# Patient Record
Sex: Female | Born: 1960 | Race: Black or African American | Hispanic: No | Marital: Married | Smoking: Former smoker
Health system: Southern US, Community
[De-identification: ages and names within clinical notes are randomized; demographics above are authoritative.]

## PROBLEM LIST (undated history)

## (undated) ENCOUNTER — Emergency Department (HOSPITAL_COMMUNITY): Admission: EM | Payer: 59 | Source: Home / Self Care

## (undated) DIAGNOSIS — Z91018 Allergy to other foods: Secondary | ICD-10-CM

## (undated) DIAGNOSIS — J45909 Unspecified asthma, uncomplicated: Secondary | ICD-10-CM

## (undated) DIAGNOSIS — K219 Gastro-esophageal reflux disease without esophagitis: Secondary | ICD-10-CM

## (undated) DIAGNOSIS — R0600 Dyspnea, unspecified: Secondary | ICD-10-CM

## (undated) DIAGNOSIS — M545 Low back pain, unspecified: Secondary | ICD-10-CM

## (undated) DIAGNOSIS — M1990 Unspecified osteoarthritis, unspecified site: Secondary | ICD-10-CM

## (undated) DIAGNOSIS — E559 Vitamin D deficiency, unspecified: Secondary | ICD-10-CM

## (undated) DIAGNOSIS — I10 Essential (primary) hypertension: Secondary | ICD-10-CM

## (undated) DIAGNOSIS — Z8489 Family history of other specified conditions: Secondary | ICD-10-CM

## (undated) DIAGNOSIS — E538 Deficiency of other specified B group vitamins: Secondary | ICD-10-CM

## (undated) DIAGNOSIS — E8881 Metabolic syndrome: Secondary | ICD-10-CM

## (undated) DIAGNOSIS — J349 Unspecified disorder of nose and nasal sinuses: Secondary | ICD-10-CM

## (undated) DIAGNOSIS — E785 Hyperlipidemia, unspecified: Secondary | ICD-10-CM

## (undated) DIAGNOSIS — C801 Malignant (primary) neoplasm, unspecified: Secondary | ICD-10-CM

## (undated) DIAGNOSIS — R1310 Dysphagia, unspecified: Secondary | ICD-10-CM

## (undated) DIAGNOSIS — R7303 Prediabetes: Secondary | ICD-10-CM

## (undated) DIAGNOSIS — D649 Anemia, unspecified: Secondary | ICD-10-CM

## (undated) DIAGNOSIS — M2550 Pain in unspecified joint: Secondary | ICD-10-CM

## (undated) DIAGNOSIS — G709 Myoneural disorder, unspecified: Secondary | ICD-10-CM

## (undated) DIAGNOSIS — M792 Neuralgia and neuritis, unspecified: Secondary | ICD-10-CM

## (undated) DIAGNOSIS — R0602 Shortness of breath: Secondary | ICD-10-CM

## (undated) DIAGNOSIS — T7840XA Allergy, unspecified, initial encounter: Secondary | ICD-10-CM

## (undated) HISTORY — DX: Allergy to other foods: Z91.018

## (undated) HISTORY — PX: COLONOSCOPY: SHX174

## (undated) HISTORY — DX: Low back pain: M54.5

## (undated) HISTORY — DX: Prediabetes: R73.03

## (undated) HISTORY — DX: Dyspnea, unspecified: R06.00

## (undated) HISTORY — PX: WISDOM TOOTH EXTRACTION: SHX21

## (undated) HISTORY — DX: Neuralgia and neuritis, unspecified: M79.2

## (undated) HISTORY — DX: Deficiency of other specified B group vitamins: E53.8

## (undated) HISTORY — DX: Shortness of breath: R06.02

## (undated) HISTORY — PX: FOOT SURGERY: SHX648

## (undated) HISTORY — DX: Low back pain, unspecified: M54.50

## (undated) HISTORY — DX: Gastro-esophageal reflux disease without esophagitis: K21.9

## (undated) HISTORY — DX: Unspecified disorder of nose and nasal sinuses: J34.9

## (undated) HISTORY — DX: Anemia, unspecified: D64.9

## (undated) HISTORY — DX: Metabolic syndrome: E88.810

## (undated) HISTORY — DX: Vitamin D deficiency, unspecified: E55.9

## (undated) HISTORY — DX: Metabolic syndrome: E88.81

## (undated) HISTORY — DX: Myoneural disorder, unspecified: G70.9

## (undated) HISTORY — DX: Dysphagia, unspecified: R13.10

## (undated) HISTORY — DX: Unspecified asthma, uncomplicated: J45.909

## (undated) HISTORY — DX: Pain in unspecified joint: M25.50

## (undated) HISTORY — DX: Unspecified osteoarthritis, unspecified site: M19.90

## (undated) HISTORY — PX: ESOPHAGOGASTRODUODENOSCOPY: SHX1529

## (undated) HISTORY — DX: Allergy, unspecified, initial encounter: T78.40XA

## (undated) HISTORY — DX: Hyperlipidemia, unspecified: E78.5

---

## 1997-12-05 ENCOUNTER — Ambulatory Visit (HOSPITAL_COMMUNITY): Admission: RE | Admit: 1997-12-05 | Discharge: 1997-12-05 | Payer: Self-pay | Admitting: Obstetrics & Gynecology

## 1998-03-24 ENCOUNTER — Ambulatory Visit (HOSPITAL_COMMUNITY): Admission: RE | Admit: 1998-03-24 | Discharge: 1998-03-24 | Payer: Self-pay | Admitting: *Deleted

## 1998-11-17 ENCOUNTER — Other Ambulatory Visit: Admission: RE | Admit: 1998-11-17 | Discharge: 1998-11-17 | Payer: Self-pay | Admitting: Obstetrics and Gynecology

## 1999-05-17 ENCOUNTER — Encounter: Admission: RE | Admit: 1999-05-17 | Discharge: 1999-05-17 | Payer: Self-pay | Admitting: *Deleted

## 1999-05-17 ENCOUNTER — Encounter: Payer: Self-pay | Admitting: *Deleted

## 1999-06-25 ENCOUNTER — Encounter: Admission: RE | Admit: 1999-06-25 | Discharge: 1999-09-23 | Payer: Self-pay | Admitting: Internal Medicine

## 1999-12-23 ENCOUNTER — Other Ambulatory Visit: Admission: RE | Admit: 1999-12-23 | Discharge: 1999-12-23 | Payer: Self-pay | Admitting: Obstetrics and Gynecology

## 2000-08-04 ENCOUNTER — Inpatient Hospital Stay: Admission: AD | Admit: 2000-08-04 | Discharge: 2000-08-04 | Payer: Self-pay | Admitting: Obstetrics and Gynecology

## 2000-09-19 ENCOUNTER — Encounter: Payer: Self-pay | Admitting: Obstetrics & Gynecology

## 2000-09-19 ENCOUNTER — Ambulatory Visit (HOSPITAL_COMMUNITY): Admission: RE | Admit: 2000-09-19 | Discharge: 2000-09-19 | Payer: Self-pay | Admitting: Obstetrics & Gynecology

## 2000-09-24 ENCOUNTER — Inpatient Hospital Stay (HOSPITAL_COMMUNITY): Admission: AD | Admit: 2000-09-24 | Discharge: 2000-09-27 | Payer: Self-pay | Admitting: Obstetrics and Gynecology

## 2000-10-01 ENCOUNTER — Inpatient Hospital Stay (HOSPITAL_COMMUNITY): Admission: AD | Admit: 2000-10-01 | Discharge: 2000-10-01 | Payer: Self-pay | Admitting: Obstetrics and Gynecology

## 2000-10-02 ENCOUNTER — Encounter: Payer: Self-pay | Admitting: Obstetrics & Gynecology

## 2000-10-02 ENCOUNTER — Inpatient Hospital Stay (HOSPITAL_COMMUNITY): Admission: AD | Admit: 2000-10-02 | Discharge: 2000-10-02 | Payer: Self-pay | Admitting: Obstetrics & Gynecology

## 2000-10-03 ENCOUNTER — Inpatient Hospital Stay (HOSPITAL_COMMUNITY): Admission: AD | Admit: 2000-10-03 | Discharge: 2000-10-03 | Payer: Self-pay | Admitting: Obstetrics & Gynecology

## 2000-10-03 ENCOUNTER — Encounter: Payer: Self-pay | Admitting: Obstetrics & Gynecology

## 2001-02-05 ENCOUNTER — Other Ambulatory Visit: Admission: RE | Admit: 2001-02-05 | Discharge: 2001-02-05 | Payer: Self-pay | Admitting: Obstetrics and Gynecology

## 2001-02-16 ENCOUNTER — Emergency Department (HOSPITAL_COMMUNITY): Admission: EM | Admit: 2001-02-16 | Discharge: 2001-02-16 | Payer: Self-pay | Admitting: Emergency Medicine

## 2001-02-16 ENCOUNTER — Encounter: Payer: Self-pay | Admitting: Emergency Medicine

## 2001-03-06 ENCOUNTER — Encounter: Admission: RE | Admit: 2001-03-06 | Discharge: 2001-03-21 | Payer: Self-pay | Admitting: Internal Medicine

## 2002-05-20 ENCOUNTER — Other Ambulatory Visit: Admission: RE | Admit: 2002-05-20 | Discharge: 2002-05-20 | Payer: Self-pay | Admitting: Obstetrics and Gynecology

## 2003-05-22 ENCOUNTER — Other Ambulatory Visit: Admission: RE | Admit: 2003-05-22 | Discharge: 2003-05-22 | Payer: Self-pay | Admitting: Obstetrics and Gynecology

## 2004-06-17 ENCOUNTER — Other Ambulatory Visit: Admission: RE | Admit: 2004-06-17 | Discharge: 2004-06-17 | Payer: Self-pay | Admitting: Obstetrics and Gynecology

## 2007-09-08 ENCOUNTER — Encounter: Payer: Self-pay | Admitting: Internal Medicine

## 2007-09-10 ENCOUNTER — Encounter: Payer: Self-pay | Admitting: Internal Medicine

## 2007-09-14 ENCOUNTER — Encounter: Payer: Self-pay | Admitting: Internal Medicine

## 2007-09-17 ENCOUNTER — Ambulatory Visit: Payer: Self-pay | Admitting: Internal Medicine

## 2007-09-17 DIAGNOSIS — R198 Other specified symptoms and signs involving the digestive system and abdomen: Secondary | ICD-10-CM

## 2007-09-17 DIAGNOSIS — M129 Arthropathy, unspecified: Secondary | ICD-10-CM | POA: Insufficient documentation

## 2007-09-17 DIAGNOSIS — R12 Heartburn: Secondary | ICD-10-CM

## 2007-09-17 DIAGNOSIS — E7800 Pure hypercholesterolemia, unspecified: Secondary | ICD-10-CM

## 2007-09-19 ENCOUNTER — Encounter: Payer: Self-pay | Admitting: Internal Medicine

## 2007-09-25 ENCOUNTER — Ambulatory Visit: Payer: Self-pay | Admitting: Internal Medicine

## 2007-11-27 ENCOUNTER — Ambulatory Visit: Payer: Self-pay | Admitting: Internal Medicine

## 2007-12-06 ENCOUNTER — Ambulatory Visit: Payer: Self-pay | Admitting: Internal Medicine

## 2007-12-17 ENCOUNTER — Ambulatory Visit: Payer: Self-pay | Admitting: Internal Medicine

## 2007-12-25 ENCOUNTER — Encounter: Payer: Self-pay | Admitting: Internal Medicine

## 2007-12-25 ENCOUNTER — Ambulatory Visit: Payer: Self-pay | Admitting: Internal Medicine

## 2008-03-06 ENCOUNTER — Encounter: Admission: RE | Admit: 2008-03-06 | Discharge: 2008-03-06 | Payer: Self-pay | Admitting: Allergy

## 2009-05-02 HISTORY — PX: MOUTH SURGERY: SHX715

## 2010-06-04 ENCOUNTER — Other Ambulatory Visit: Payer: Self-pay | Admitting: Family Medicine

## 2010-06-04 DIAGNOSIS — M25512 Pain in left shoulder: Secondary | ICD-10-CM

## 2010-06-07 ENCOUNTER — Ambulatory Visit
Admission: RE | Admit: 2010-06-07 | Discharge: 2010-06-07 | Disposition: A | Discharge status: Home / Self Care | Payer: 59 | Source: Ambulatory Visit | Attending: Family Medicine | Admitting: Family Medicine

## 2010-06-07 DIAGNOSIS — M25512 Pain in left shoulder: Secondary | ICD-10-CM

## 2010-08-27 ENCOUNTER — Other Ambulatory Visit: Payer: Self-pay | Admitting: Sports Medicine

## 2010-08-27 DIAGNOSIS — M542 Cervicalgia: Secondary | ICD-10-CM

## 2010-08-27 DIAGNOSIS — M25512 Pain in left shoulder: Secondary | ICD-10-CM

## 2010-08-28 ENCOUNTER — Ambulatory Visit
Admission: RE | Admit: 2010-08-28 | Discharge: 2010-08-28 | Disposition: A | Discharge status: Home / Self Care | Payer: 59 | Source: Ambulatory Visit | Attending: Sports Medicine | Admitting: Sports Medicine

## 2010-08-28 DIAGNOSIS — M542 Cervicalgia: Secondary | ICD-10-CM

## 2010-09-17 NOTE — Op Note (Signed)
[facility]  Patient:    Nicole Rasmussen, Nicole Rasmussen                   MRN: [id number] Proc. Date: 09/25/00 Adm. Date:  [id number] Attending:  Osborn Coho                           Operative Report  PREOPERATIVE DIAGNOSES:       1. Intrauterine pregnancy at 41-1/2 weeks                                  estimated gestational age.                               2. Post term pregnancy.                               3. Advanced maternal age.                               4. Failure to progress.                               5. Uterine fibroids.  POSTOPERATIVE DIAGNOSES:      1. Intrauterine pregnancy at 41-1/2 weeks                                  estimated gestational age.                               2. Post term pregnancy.                               3. Advanced maternal age.                               4. Failure to progress.                               5. Uterine fibroids.  PROCEDURE:                    Primary low transverse cesarean section.  SURGEON:                      Mark E. Dareen Piano, M.D.  ANESTHESIA:                   General endotracheal.  ANTIBIOTICS:                  Ancef 1 g.  ESTIMATED BLOOD LOSS:         900 cc.  SPECIMENS:                    None.  DRAINS:                       Foley  to bedside drainage.  COMPLICATIONS:                None.  FINDINGS:                     The patient had normal fallopian tubes and ovaries bilaterally.  The patient had several uterine fibroids, the largest was approximately 5 cm, which was in the posterior aspect of the uterus in the middle in the body of the uterus.  Several other smaller fibroids were scattered throughout the uterus.  The placenta appeared to be normal.  The patient delivered one live viable black female infant weighing 9 lb and 2 oz.  DESCRIPTION OF PROCEDURE:     The patient was taken to the operating room, where she was placed in the dorsal supine position  with a left lateral tilt. A Foley catheter had been previously placed.  was placed as prepped with Hibiclens and draped in the usual fashion for this procedure.  General endotracheal anesthetic was then administered.  A Pfannenstiel incision was then made.  This was carried down to the fascia.  The fascia was entered in the midline and extended laterally.  The rectus muscles were then dissected from the fascia with a Bovie.  The rectus muscles were divided in the midline and taken superiorly and inferiorly.  The peritoneum was then entered.  A bladder flap was taken down sharply.  A low transverse uterine incision was made in the midline and extended laterally.  Amniotic fluid was noted to be clear.  The infant was delivered from the vertex presentation.  On delivery of the head, the oropharynx and nostrils were bulb suctioned.  The remaining infant was then delivered.  The cord was doubly clamped and cut and the infant handed to the waiting NICU team.  Cord blood was then obtained.  The placenta was then manually removed.  The uterus was exteriorized.  The uterine cavity was wiped with a wet lap.  The uterine incision was closed with a single layer of 0 chromic in a running locking fashion.  A small area of bleeding in the middle of the incision was made hemostatic with interrupted 0 Monocryl suture. The bladder flap was not closed.  The uterus was placed back in the abdominal cavity.  The abdominal gutters were wiped with a wet lap.  The incision was again rechecked and felt to be hemostatic.  The parietal peritoneum was not closed.  The fascia was closed using 0 Monocryl in a running fashion.  The subcuticular tissue was made hemostatic with the Bovie.  Stainless steel clips were used to close the skin.  The patient tolerated the procedure well.  She was taken to the recovery room in stable condition.  Instrument and lap counts were correct x 2. DD:  09/25/00 TD:  09/25/00 Job:  [id number]

## 2010-09-17 NOTE — Discharge Summary (Signed)
[facility]  Patient:    Nicole Rasmussen, Nicole Rasmussen                   MRN: [id number] Adm. Date:  [id number] Disc. Date: [id number] Attending:  Lars Pinks                           Discharge Summary  FINAL DIAGNOSES:              1. Intrauterine pregnancy, delivered living well                                  female infant weighing 9 pounds 2 ounces.                               2. Postterm pregnancy.                               3. Advanced maternal age.                               4. Failure to progress.                               5. Uterine fibroids.  SECONDARY DIAGNOSIS:          None.  PROCEDURE:                    Primary low transverse cesarean section.  COMPLICATIONS:                None.  CONDITION ON DISCHARGE:       Improved.  HOSPITAL COURSE:              This is a 50 year old, gravida 4, para 0, who was brought to the hospital for induction of labor due to postdates pregnancy. The patient was begun on induction which progressed to 5 cm; however, at that point, the patient failed to progress any further and the decision was made to proceed with cesarean section for failure to progress. She was brought to the operating room by Dr. Malva Limes where a primary low transverse cesarean section was performed under general endotracheal anesthesia. The surgery went without complications with the delivery of a 9-pound 2-ounce female infant, Apgar scores 9 and 9. The patients postoperative course was benign without significant fever or anemia. On the second postoperative day, the patient was afebrile, her vital signs were stable, she was doing well, her pain was controlled with oral pain medications, she was voiding well, and passing gas. She was therefore felt to be ready for discharge. She was discharged on a regular diet. She told to limit her activity.  DISCHARGE MEDICATIONS:        Tylox, 30 tablets, take one to two every  four hours for pain. She was also instructed to take prenatal vitamins and over-the-counter pain medication as well.  DISCHARGE FOLLOWUP:           She was asked to return to the office in four weeks for her followup evaluation.  LABORATORY DATA:              Her  admission hemoglobin was 12.6 with a white count of 13,000. Her discharge hemoglobin was 11.0 with white count of 19,000. Her platelet count was low with clumps noted on the peripheral smear. 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 DD:  10/16/00 TD:  10/17/00 Job: [id number]

## 2010-11-01 ENCOUNTER — Emergency Department (INDEPENDENT_AMBULATORY_CARE_PROVIDER_SITE_OTHER): Payer: 59

## 2010-11-01 ENCOUNTER — Emergency Department (HOSPITAL_BASED_OUTPATIENT_CLINIC_OR_DEPARTMENT_OTHER)
Admission: EM | Admit: 2010-11-01 | Discharge: 2010-11-01 | Disposition: A | Discharge status: Home / Self Care | Payer: 59 | Attending: Emergency Medicine | Admitting: Emergency Medicine

## 2010-11-01 DIAGNOSIS — F17200 Nicotine dependence, unspecified, uncomplicated: Secondary | ICD-10-CM | POA: Insufficient documentation

## 2010-11-01 DIAGNOSIS — M19079 Primary osteoarthritis, unspecified ankle and foot: Secondary | ICD-10-CM

## 2010-11-01 DIAGNOSIS — R609 Edema, unspecified: Secondary | ICD-10-CM

## 2010-11-01 DIAGNOSIS — Y92009 Unspecified place in unspecified non-institutional (private) residence as the place of occurrence of the external cause: Secondary | ICD-10-CM | POA: Insufficient documentation

## 2010-11-01 DIAGNOSIS — G8929 Other chronic pain: Secondary | ICD-10-CM | POA: Insufficient documentation

## 2010-11-01 DIAGNOSIS — E7800 Pure hypercholesterolemia, unspecified: Secondary | ICD-10-CM | POA: Insufficient documentation

## 2010-11-01 DIAGNOSIS — M25539 Pain in unspecified wrist: Secondary | ICD-10-CM

## 2010-11-01 DIAGNOSIS — W108XXA Fall (on) (from) other stairs and steps, initial encounter: Secondary | ICD-10-CM | POA: Insufficient documentation

## 2010-11-01 DIAGNOSIS — M79609 Pain in unspecified limb: Secondary | ICD-10-CM

## 2010-11-01 DIAGNOSIS — S63509A Unspecified sprain of unspecified wrist, initial encounter: Secondary | ICD-10-CM | POA: Insufficient documentation

## 2010-11-01 DIAGNOSIS — M542 Cervicalgia: Secondary | ICD-10-CM | POA: Insufficient documentation

## 2011-01-17 ENCOUNTER — Other Ambulatory Visit: Payer: Self-pay | Admitting: Obstetrics and Gynecology

## 2013-05-19 ENCOUNTER — Ambulatory Visit (INDEPENDENT_AMBULATORY_CARE_PROVIDER_SITE_OTHER): Payer: 59 | Admitting: Emergency Medicine

## 2013-05-19 ENCOUNTER — Ambulatory Visit: Payer: 59

## 2013-05-19 VITALS — BP 110/68 | HR 115 | Temp 101.4°F | Resp 20 | Ht 67.5 in | Wt 272.6 lb

## 2013-05-19 DIAGNOSIS — R6889 Other general symptoms and signs: Secondary | ICD-10-CM

## 2013-05-19 DIAGNOSIS — R05 Cough: Secondary | ICD-10-CM

## 2013-05-19 DIAGNOSIS — R059 Cough, unspecified: Secondary | ICD-10-CM

## 2013-05-19 LAB — POCT CBC
GRANULOCYTE PERCENT: 74.6 % (ref 37–80)
HEMATOCRIT: 44.3 % (ref 37.7–47.9)
Hemoglobin: 13.5 g/dL (ref 12.2–16.2)
Lymph, poc: 2.4 (ref 0.6–3.4)
MCH: 27.3 pg (ref 27–31.2)
MCHC: 30.5 g/dL — AB (ref 31.8–35.4)
MCV: 89.7 fL (ref 80–97)
MID (CBC): 1.2 — AB (ref 0–0.9)
MPV: 10.4 fL (ref 0–99.8)
POC Granulocyte: 10.6 — AB (ref 2–6.9)
POC LYMPH %: 17 % (ref 10–50)
POC MID %: 8.4 %M (ref 0–12)
Platelet Count, POC: 166 10*3/uL (ref 142–424)
RBC: 4.94 M/uL (ref 4.04–5.48)
RDW, POC: 14.7 %
WBC: 14.2 10*3/uL — AB (ref 4.6–10.2)

## 2013-05-19 LAB — POCT INFLUENZA A/B
INFLUENZA A, POC: NEGATIVE
INFLUENZA B, POC: NEGATIVE

## 2013-05-19 MED ORDER — AZITHROMYCIN 250 MG PO TABS: ORAL_TABLET | ORAL | Status: DC

## 2013-05-19 MED ORDER — OSELTAMIVIR PHOSPHATE 75 MG PO CAPS: 75.0000 mg | ORAL_CAPSULE | Freq: Two times a day (BID) | ORAL | Status: DC

## 2013-05-19 NOTE — Progress Notes (Deleted)
Subjective:   This chart was scribed for Nena Jordan MD by Forrestine Him, [facility] Scribe. This patient was seen in room 4 and the patient's care was started 5:48 PM.     Patient ID: Nicole Rasmussen, female    DOB: November 05, 1960, 53 y.o.   MRN: [id number]  HPI HPI Comments: Nicole Rasmussen is a 53 y.o. female who presents to [facility] complaining of a gradual onset, ongoing, constant fever that initially started 2 days ago. She also lists generalized body aches, chills, and HA as associated symptoms. She has tried OTC ibuprofen with no improvement. She denies any aggravating or alleviating factors at this time. She denies any sick contacts, but states her husband recently had pneumonia several weeks ago. She denies cough or sore throat.  Review of Systems  Constitutional: Positive for fever and chills.  HENT: Negative for sore throat.   Respiratory: Negative for cough.   Musculoskeletal: Positive for myalgias.    Past Medical History  Diagnosis Date   Allergy    History reviewed. No pertinent past surgical history.  History   Social History   Marital Status: Married    Spouse Name: N/A    Number of Children: N/A   Years of Education: N/A   Occupational History   Not on file.   Social History Main Topics   Smoking status: Never Smoker    Smokeless tobacco: Not on file   Alcohol Use: Yes   Drug Use: No   Sexual Activity: Not on file   Other Topics Concern   Not on file   Social History Narrative   No narrative on file    Triage Vitals: BP 110/68   Pulse 115   Temp(Src) 101.4 F (38.6 C) (Oral)   Resp 20   Ht 5' 7.5" (1.715 m)   Wt 272 lb 9.6 oz (123.651 kg)   BMI 42.04 kg/m2   SpO2 97%   LMP 10/11/2010   Objective:   Physical Exam  CONSTITUTIONAL: Well developed/well nourished HEAD: Normocephalic/atraumatic EYES: EOMI/PERRL ENMT: Mucous membranes moist NECK: supple no meningeal signs SPINE:entire spine  nontender CV: S1/S2 noted, no murmurs/rubs/gallops noted LUNGS: Lungs are clear to auscultation bilaterally, no apparent distress ABDOMEN: soft, nontender, no rebound or guarding GU:no cva tenderness NEURO: Pt is awake/alert, moves all extremitiesx4 EXTREMITIES: pulses normal, full ROM SKIN: warm, color normal PSYCH: no abnormalities of mood noted   Results for orders placed in visit on 05/19/13  POCT INFLUENZA A/B      Result Value Range   Influenza A, POC Negative     Influenza B, POC Negative     Results for orders placed in visit on 05/19/13  POCT INFLUENZA A/B      Result Value Range   Influenza A, POC Negative     Influenza B, POC Negative    POCT CBC      Result Value Range   WBC 14.2 (*) 4.6 - 10.2 K/uL   Lymph, poc 2.4  0.6 - 3.4   POC LYMPH PERCENT 17.0  10 - 50 %L   MID (cbc) 1.2 (*) 0 - 0.9   POC MID % 8.4  0 - 12 %M   POC Granulocyte 10.6 (*) 2 - 6.9   Granulocyte percent 74.6  37 - 80 %G   RBC 4.94  4.04 - 5.48 M/uL   Hemoglobin 13.5  12.2 - 16.2 g/dL   HCT, POC 44.3  37.7 - 47.9 %  MCV 89.7  80 - 97 fL   MCH, POC 27.3  27 - 31.2 pg   MCHC 30.5 (*) 31.8 - 35.4 g/dL   RDW, POC 14.7     Platelet Count, POC 166  142 - 424 K/uL   MPV 10.4  0 - 99.8 fL  [facility] reading (PRIMARY) by  Dr. Everlene Farrier no definite pneumonia seen   Assessment & Plan:  We'll treat with Tamiflu and Z-Pak. I did not see any definite pneumonia on x-ray but was concerned about the elevated white count.

## 2013-05-19 NOTE — Patient Instructions (Signed)

## 2013-05-20 NOTE — Progress Notes (Signed)
° °Subjective:  ° °This chart was scribed for Nicole Dorrene Bently MD by Ashley Leger, [facility] Scribe. This patient was seen in room 4 and the patient's care was started 5:48 PM. °  ° ° Patient ID: Nicole Rasmussen, female    DOB: 01/29/1961, 53 y.o.   MRN: [id number] ° °HPI °HPI Comments: °Nicole Rasmussen is a 53 y.o. female who presents to [facility] complaining of a gradual onset, ongoing, constant fever that initially started 2 days ago. She also lists generalized body aches, chills, and HA as associated symptoms. She has tried OTC ibuprofen with no improvement. She denies any aggravating or alleviating factors at this time. She denies any sick contacts, but states her husband recently had pneumonia several weeks ago. She denies cough or sore throat. ° °Review of Systems  °Constitutional: Positive for fever and chills.  °HENT: Negative for sore throat.   °Respiratory: Negative for cough.   °Musculoskeletal: Positive for myalgias.  ° ° °Past Medical History  °Diagnosis Date  °• Allergy   ° History reviewed. No pertinent past surgical history.  °History  ° °Social History  °• Marital Status: Married  °  Spouse Name: N/A  °  Number of Children: N/A  °• Years of Education: N/A  ° °Occupational History  °• Not on file.  ° °Social History Main Topics  °• Smoking status: Never Smoker   °• Smokeless tobacco: Not on file  °• Alcohol Use: Yes  °• Drug Use: No  °• Sexual Activity: Not on file  ° °Other Topics Concern  °• Not on file  ° °Social History Narrative  °• No narrative on file  °  °Triage Vitals: BP 110/68   Pulse 115   Temp(Src) 101.4 °F (38.6 °C) (Oral)   Resp 20   Ht 5' 7.5" (1.715 m)   Wt 272 lb 9.6 oz (123.651 kg)   BMI 42.04 kg/m2   SpO2 97%   LMP 10/11/2010  ° °Objective:  ° Physical Exam ° °CONSTITUTIONAL: Well developed/well nourished °HEAD: Normocephalic/atraumatic °EYES: EOMI/PERRL °ENMT: Mucous membranes moist °NECK: supple no meningeal signs °SPINE:entire spine  nontender °CV: S1/S2 noted, no murmurs/rubs/gallops noted °LUNGS: Lungs are clear to auscultation bilaterally, no apparent distress °ABDOMEN: soft, nontender, no rebound or guarding °GU:no cva tenderness °NEURO: Pt is awake/alert, moves all extremitiesx4 °EXTREMITIES: pulses normal, full ROM °SKIN: warm, color normal °PSYCH: no abnormalities of mood noted  ° °Results for orders placed in visit on 05/19/13  °POCT INFLUENZA A/B  °    Result Value Range  ° Influenza A, POC Negative    ° Influenza B, POC Negative    ° °Results for orders placed in visit on 05/19/13  °POCT INFLUENZA A/B  °    Result Value Range  ° Influenza A, POC Negative    ° Influenza B, POC Negative    °POCT CBC  °    Result Value Range  ° WBC 14.2 (*) 4.6 - 10.2 K/uL  ° Lymph, poc 2.4  0.6 - 3.4  ° POC LYMPH PERCENT 17.0  10 - 50 %L  ° MID (cbc) 1.2 (*) 0 - 0.9  ° POC MID % 8.4  0 - 12 %M  ° POC Granulocyte 10.6 (*) 2 - 6.9  ° Granulocyte percent 74.6  37 - 80 %G  ° RBC 4.94  4.04 - 5.48 M/uL  ° Hemoglobin 13.5  12.2 - 16.2 g/dL  ° HCT, POC 44.3  37.7 - 47.9 %  °   MCV 89.7  80 - 97 fL  ° MCH, POC 27.3  27 - 31.2 pg  ° MCHC 30.5 (*) 31.8 - 35.4 g/dL  ° RDW, POC 14.7    ° Platelet Count, POC 166  142 - 424 K/uL  ° MPV 10.4  0 - 99.8 fL  °[facility] reading (PRIMARY) by  Dr. Eiko Mcgowen no definite pneumonia seen ° ° °Assessment & Plan:  °We'll treat with Tamiflu and Z-Pak. I did not see any definite pneumonia on x-ray but was concerned about the elevated white count. °

## 2013-06-08 ENCOUNTER — Emergency Department (HOSPITAL_BASED_OUTPATIENT_CLINIC_OR_DEPARTMENT_OTHER): Payer: 59

## 2013-06-08 ENCOUNTER — Encounter (HOSPITAL_BASED_OUTPATIENT_CLINIC_OR_DEPARTMENT_OTHER): Payer: Self-pay | Admitting: Emergency Medicine

## 2013-06-08 ENCOUNTER — Emergency Department (HOSPITAL_BASED_OUTPATIENT_CLINIC_OR_DEPARTMENT_OTHER)
Admission: EM | Admit: 2013-06-08 | Discharge: 2013-06-08 | Disposition: A | Discharge status: Home / Self Care | Payer: 59 | Attending: Emergency Medicine | Admitting: Emergency Medicine

## 2013-06-08 DIAGNOSIS — R519 Headache, unspecified: Secondary | ICD-10-CM | POA: Diagnosis present

## 2013-06-08 DIAGNOSIS — H9209 Otalgia, unspecified ear: Secondary | ICD-10-CM | POA: Insufficient documentation

## 2013-06-08 DIAGNOSIS — R42 Dizziness and giddiness: Secondary | ICD-10-CM | POA: Insufficient documentation

## 2013-06-08 DIAGNOSIS — Z79899 Other long term (current) drug therapy: Secondary | ICD-10-CM | POA: Insufficient documentation

## 2013-06-08 DIAGNOSIS — R0602 Shortness of breath: Secondary | ICD-10-CM | POA: Insufficient documentation

## 2013-06-08 DIAGNOSIS — R0789 Other chest pain: Secondary | ICD-10-CM | POA: Diagnosis present

## 2013-06-08 DIAGNOSIS — R05 Cough: Secondary | ICD-10-CM | POA: Insufficient documentation

## 2013-06-08 DIAGNOSIS — R059 Cough, unspecified: Secondary | ICD-10-CM | POA: Insufficient documentation

## 2013-06-08 DIAGNOSIS — J3489 Other specified disorders of nose and nasal sinuses: Secondary | ICD-10-CM | POA: Insufficient documentation

## 2013-06-08 DIAGNOSIS — R51 Headache: Secondary | ICD-10-CM | POA: Insufficient documentation

## 2013-06-08 LAB — COMPREHENSIVE METABOLIC PANEL
ALBUMIN: 3.9 g/dL (ref 3.5–5.2)
ALK PHOS: 96 U/L (ref 39–117)
ALT: 20 U/L (ref 0–35)
AST: 20 U/L (ref 0–37)
BUN: 16 mg/dL (ref 6–23)
CO2: 26 mEq/L (ref 19–32)
Calcium: 9.9 mg/dL (ref 8.4–10.5)
Chloride: 106 mEq/L (ref 96–112)
Creatinine, Ser: 0.7 mg/dL (ref 0.50–1.10)
GFR calc Af Amer: >90 mL/min (ref >90)
GFR calc non Af Amer: >90 mL/min (ref >90)
Glucose, Bld: 99 mg/dL (ref 70–99)
POTASSIUM: 4.1 meq/L (ref 3.7–5.3)
SODIUM: 145 meq/L (ref 137–147)
TOTAL PROTEIN: 7.5 g/dL (ref 6.0–8.3)
Total Bilirubin: 0.3 mg/dL (ref 0.3–1.2)

## 2013-06-08 LAB — CBC WITH DIFFERENTIAL/PLATELET
Basophils Absolute: 0 10*3/uL (ref 0.0–0.1)
Basophils Relative: 0 % (ref 0–1)
EOS ABS: 0.2 10*3/uL (ref 0.0–0.7)
Eosinophils Relative: 2 % (ref 0–5)
HCT: 39.8 % (ref 36.0–46.0)
Hemoglobin: 12.8 g/dL (ref 12.0–15.0)
Lymphocytes Relative: 42 % (ref 12–46)
Lymphs Abs: 4.1 10*3/uL — ABNORMAL HIGH (ref 0.7–4.0)
MCH: 27.2 pg (ref 26.0–34.0)
MCHC: 32.2 g/dL (ref 30.0–36.0)
MCV: 84.7 fL (ref 78.0–100.0)
Monocytes Absolute: 0.8 10*3/uL (ref 0.1–1.0)
Monocytes Relative: 9 % (ref 3–12)
NEUTROS PCT: 47 % (ref 43–77)
Neutro Abs: 4.6 10*3/uL (ref 1.7–7.7)
PLATELETS: ADEQUATE 10*3/uL (ref 150–400)
RBC: 4.7 MIL/uL (ref 3.87–5.11)
RDW: 14.1 % (ref 11.5–15.5)
WBC: 9.7 10*3/uL (ref 4.0–10.5)

## 2013-06-08 LAB — TROPONIN I

## 2013-06-08 MED ORDER — DIPHENHYDRAMINE HCL 50 MG/ML IJ SOLN
25.0000 mg | Freq: Once | INTRAMUSCULAR | Status: AC
  Administered 2013-06-08: 25 mg via INTRAVENOUS
  Filled 2013-06-08: qty 1

## 2013-06-08 MED ORDER — DEXAMETHASONE SODIUM PHOSPHATE 10 MG/ML IJ SOLN
10.0000 mg | Freq: Once | INTRAMUSCULAR | Status: AC
  Administered 2013-06-08: 10 mg via INTRAVENOUS
  Filled 2013-06-08: qty 1

## 2013-06-08 MED ORDER — METOCLOPRAMIDE HCL 5 MG/ML IJ SOLN
10.0000 mg | Freq: Once | INTRAMUSCULAR | Status: AC
  Administered 2013-06-08: 10 mg via INTRAVENOUS
  Filled 2013-06-08: qty 2

## 2013-06-08 MED ORDER — SODIUM CHLORIDE 0.9 % IV BOLUS (SEPSIS)
1000.0000 mL | INTRAVENOUS | Status: AC
  Administered 2013-06-08: 1000 mL via INTRAVENOUS

## 2013-06-08 NOTE — ED Notes (Signed)
Reports intermittently having headache, sharp chest pain, SHOB.  Felt like she was going to pass out earlier today.  Denies chest pain presently, just c/o feeling tight.  Had the flu the third week of January.  Continued chest congestion.  Denies fever, nausea, vomiting.

## 2013-06-08 NOTE — ED Notes (Signed)
Patient transported to CT 

## 2013-06-08 NOTE — ED Provider Notes (Signed)
CSN: [id number]     Arrival date & time 06/08/13  1911 History   This chart was scribed for Blanchard Kelch, MD by Mercy Moore, ED scribe.  This patient was seen in room MH09/MH09 and the patient's care was started at 8:04 PM.   Chief Complaint  Patient presents with  . Headache   Patient is a 53 y.o. female presenting with headaches. The history is provided by the patient. No language interpreter was used.  Headache Pain location:  Generalized Quality:  Sharp Radiates to:  Does not radiate Severity currently:  7/10 Severity at highest:  7/10 Onset quality:  Sudden Duration:  8 hours Timing:  Intermittent Progression:  Worsening Chronicity:  New Similar to prior headaches: no   Associated symptoms: congestion, cough and ear pain   Associated symptoms: no abdominal pain, no back pain, no diarrhea, no fatigue, no fever, no nausea, no neck pain, no neck stiffness, no numbness, no photophobia, no sore throat and no vomiting    HPI Comments: Nicole Rasmussen is a 53 y.o. female who presents to the Emergency Department complaining of quick onset headache today at 12pm. Headache began while performing mild housework. Patient felt light headedness, short of breath, and sharp, fleeting chest tightness at onset of headache. Patient describes the pain as being most severe on the right side of her head and rates the headache at 7/10. Patient has attempted treating headache with Advil and Tylenol but has only experienced mild relief (no medication taken today). Patient says that she's had headaches throughout the week but today the pain reached its most severe. Her headaches throughout the week she described as sinus pressure, but today her headache was of a different quality. Patient had flu last week and doesn't think that she has fully recovered. Patient denies fever. Denies recent head injury. Patient denies history of headaches. No medical issues. No history of heart disease but patient does  have high cholesterol. Denies tobacco use.  Past Medical History  Diagnosis Date  . Allergy    History reviewed. No pertinent past surgical history. Family History  Problem Relation Age of Onset  . Cancer Mother   . Cancer Father   . Hyperlipidemia Sister   . Diabetes Sister   . Hyperlipidemia Brother    History  Substance Use Topics  . Smoking status: Never Smoker   . Smokeless tobacco: Not on file  . Alcohol Use: Yes   OB History   Grav Para Term Preterm Abortions TAB SAB Ect Mult Living                 Review of Systems  Constitutional: Negative for fever, chills, diaphoresis, activity change, appetite change and fatigue.  HENT: Positive for congestion and ear pain. Negative for facial swelling, rhinorrhea and sore throat.   Eyes: Negative for photophobia and discharge.  Respiratory: Positive for cough, chest tightness and shortness of breath.   Cardiovascular: Positive for chest pain. Negative for palpitations and leg swelling.  Gastrointestinal: Negative for nausea, vomiting, abdominal pain and diarrhea.  Endocrine: Negative for polydipsia and polyuria.  Genitourinary: Negative for dysuria, frequency, difficulty urinating and pelvic pain.  Musculoskeletal: Negative for arthralgias, back pain, neck pain and neck stiffness.  Skin: Negative for color change and wound.  Allergic/Immunologic: Negative for immunocompromised state.  Neurological: Positive for light-headedness and headaches. Negative for facial asymmetry, weakness and numbness.  Hematological: Does not bruise/bleed easily.  Psychiatric/Behavioral: Negative for confusion and agitation.  All other systems reviewed and are  negative.    Allergies  Shellfish allergy and Sulfonamide derivatives  Home Medications   Current Outpatient Rx  Name  Route  Sig  Dispense  Refill  . azithromycin (ZITHROMAX) 250 MG tablet      Take 2 tabs PO x 1 dose, then 1 tab PO QD x 4 days   6 tablet   0   . meloxicam  (MOBIC) 7.5 MG tablet   Oral   Take 7.5 mg by mouth daily.         Marland Kitchen oseltamivir (TAMIFLU) 75 MG capsule   Oral   Take 1 capsule (75 mg total) by mouth 2 (two) times daily.   10 capsule   0    BP 159/98  Pulse 73  Temp(Src) 98.6 F (37 C) (Oral)  Resp 20  Ht 5\' 8"  (1.727 m)  Wt 279 lb (126.554 kg)  BMI 42.43 kg/m2  SpO2 100%  LMP 10/11/2010 Physical Exam  Nursing note and vitals reviewed. Constitutional: She is oriented to person, place, and time. She appears well-developed and well-nourished. No distress.  HENT:  Head: Normocephalic and atraumatic.  Mouth/Throat: Oropharynx is clear and moist.  Eyes: Pupils are equal, round, and reactive to light.  Neck: Neck supple.  Normal range of motion of neck without nuchal rigidity.   Cardiovascular: Normal rate, regular rhythm and normal heart sounds.   Pulmonary/Chest: Effort normal and breath sounds normal. No respiratory distress. She has no wheezes.  Abdominal: Soft. She exhibits no distension. There is no tenderness. There is no rebound and no guarding.  Musculoskeletal: Normal range of motion. She exhibits no edema and no tenderness.  Neurological: She is alert and oriented to person, place, and time. She has normal strength. No cranial nerve deficit or sensory deficit. She displays a negative Romberg sign. Coordination and gait normal.  alert, oriented x3 speech: normal in context and clarity memory: intact grossly cranial nerves II-XII: intact motor strength: full proximally and distally no involuntary movements or tremors sensation: intact to light touch diffusely  cerebellar: finger-to-nose and heel-to-shin intact gait: normal forwards and backwards, normal tandem gait   Skin: Skin is warm and dry.  Psychiatric: She has a normal mood and affect.    ED Course  Procedures (including critical care time) DIAGNOSTIC STUDIES: Oxygen Saturation is 100% on room air, normal by my interpretation.    COORDINATION OF  CARE: 8:15 PM- Pt advised of plan for treatment and pt agrees.    Labs Review Labs Reviewed  CBC WITH DIFFERENTIAL - Abnormal; Notable for the following:    Lymphs Abs 4.1 (*)    All other components within normal limits  COMPREHENSIVE METABOLIC PANEL  TROPONIN I   Imaging Review Dg Chest 2 View  06/08/2013   CLINICAL DATA:  Chest pain and shortness of breath  EXAM: CHEST  2 VIEW  COMPARISON:  May 19, 2013  FINDINGS: The lungs are clear. Heart size and pulmonary vascularity are normal. No pneumothorax. No adenopathy. No bone lesions.  IMPRESSION: No edema or consolidation.   Electronically Signed   By: Lowella Grip M.D.   On: 06/08/2013 19:58    EKG Interpretation    Date/Time:  Saturday June 08 2013 19:43:24 EST Ventricular Rate:  75 PR Interval:  196 QRS Duration: 90 QT Interval:  388 QTC Calculation: 433 R Axis:   44 Text Interpretation:  Normal sinus rhythm non-specific t wave changes Confirmed by Raider Valbuena  MD, Yul Diana ([id number]) on 06/08/2013 8:38:15 PM  MDM   1. Headache   2. Atypical chest pain    53 y.o. F here w/ HA and associated fleeting chest pains. Pt AFVSS here, normal neuro exam. HA was quick in onset, but low suspicion for [facility] as HA is 7/10 and pt is well appearing. Will get screening CT head, screening labs. Atypical for cardiac cause of cp and few RF's for heart dis.    Pt feeling significantly better after migraine cocktail. I interpreted/reviewed the labs and/or imaging which were non-contributory.  Low risk for MACE per HEART score.  I have discussed the diagnosis/risks/treatment options with the patient and believe the pt to be eligible for discharge home to follow-up with pcp as needed. We also discussed returning to the ED immediately if new or worsening sx occur. We discussed the sx which are most concerning (e.g., return of HA, return of CP, fever) that necessitate immediate return. Medications administered to the patient during  their visit and any new prescriptions provided to the patient are listed below.  Medications given during this visit Medications  sodium chloride 0.9 % bolus 1,000 mL (1,000 mLs Intravenous New Bag/Given 06/08/13 2048)  metoCLOPramide (REGLAN) injection 10 mg (10 mg Intravenous Given 06/08/13 2048)  diphenhydrAMINE (BENADRYL) injection 25 mg (25 mg Intravenous Given 06/08/13 2050)  dexamethasone (DECADRON) injection 10 mg (10 mg Intravenous Given 06/08/13 2223)    Discharge Medication List as of 06/08/2013  9:51 PM        I personally performed the services described in this documentation, which was scribed in my presence. The recorded information has been reviewed and is accurate.    Blanchard Kelch, MD 06/09/13 [phone]

## 2013-08-12 ENCOUNTER — Other Ambulatory Visit: Payer: Self-pay | Admitting: Obstetrics and Gynecology

## 2013-09-25 ENCOUNTER — Other Ambulatory Visit: Payer: Self-pay | Admitting: Family Medicine

## 2013-09-25 DIAGNOSIS — M5430 Sciatica, unspecified side: Secondary | ICD-10-CM

## 2013-10-02 ENCOUNTER — Other Ambulatory Visit: Payer: 59

## 2013-10-03 ENCOUNTER — Other Ambulatory Visit: Payer: 59

## 2014-11-17 ENCOUNTER — Encounter: Payer: Self-pay | Admitting: Internal Medicine

## 2015-02-11 ENCOUNTER — Other Ambulatory Visit: Payer: Self-pay | Admitting: Obstetrics and Gynecology

## 2015-02-12 LAB — CYTOLOGY - PAP

## 2015-12-15 ENCOUNTER — Encounter: Payer: Self-pay | Admitting: Neurology

## 2015-12-15 ENCOUNTER — Ambulatory Visit (INDEPENDENT_AMBULATORY_CARE_PROVIDER_SITE_OTHER): Payer: 59 | Admitting: Neurology

## 2015-12-15 VITALS — BP 126/80 | HR 82 | Resp 16 | Ht 67.5 in | Wt 287.0 lb

## 2015-12-15 DIAGNOSIS — G5711 Meralgia paresthetica, right lower limb: Secondary | ICD-10-CM

## 2015-12-15 DIAGNOSIS — M79651 Pain in right thigh: Secondary | ICD-10-CM

## 2015-12-15 DIAGNOSIS — M5441 Lumbago with sciatica, right side: Secondary | ICD-10-CM | POA: Diagnosis not present

## 2015-12-15 NOTE — Patient Instructions (Addendum)
You have had recent check blood work.  We will do an EMG and nerve conduction velocity test, which is an electrical nerve and muscle test, which we will schedule. We will call you with the results. We may consider doing another L spine MRI, you had one in 5/15.

## 2015-12-15 NOTE — Progress Notes (Signed)
Subjective:    Patient ID: Nicole Rasmussen is a 55 y.o. female.  HPI     Star Age, MD, PhD [facility] [address], [address] [place], [place] [address]  Dear Dr. Tamala Julian,  I saw your patient, Nicole Rasmussen, upon your kind request in my neurologic clinic today for initial consultation of her right thigh pain of several months duration. The patient is unaccompanied today. As you know, [name] is a 55 year old right-handed woman with an underlying medical history of low back pain, obesity, vitamin D deficiency, anemia, impaired glucose tolerance, arthritis, reflux disease, allergic rhinitis, and hyperlipidemia, who reports right thigh pain for the past 6 months. She describes an intermittent tingling sensation, sometimes burning, sometimes a sensitivity to touch in the right frontal and lateral thigh area, sometimes she has radiating pain from the lower back to the right lateral leg and further down below the knee. She has not noticed restless leg symptoms, no foot drop, no actual weakness, no recent falls, no constant pain, no actual numbness. 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 She has mainly worked in the court room. She quit smoking some 16 years ago, does does not drink caffeine daily.  she tries to drink enough water. She does admit that she has gained weight in the past year. She is trying to lose weight.  She recalls having had an L spine MRI about a year ago. We will request the report from your office, also, you checked blood work, we will request test results on those too.   I reviewed your office note from 10/23/2015, which you kindly included. Symptomatic treatment includes gabapentin which she continues to take 300 mg strength, 1 pill 3 times a day. She has not tried it yet, has not picked it  up.   Pain, tingling and burning is worse with prolonged standing, sometimes at work, more so at home, such as when cooking.   Addendum: I reviewed her Lumbar spine MRI report*, she had a lumbar spine MRI without contrast at [facility] on 09/27/2013: Depression: This is an abnormal MRI of the lumbar spine showing degenerative changes at L3-L4, L4-L5, and L5-S1 as detailed above. The post significant findings are at L5-S1 were severe facet hypertrophy causes moderate left foraminal narrowing that could lead to dynamic impingement of the left L5 nerve root.  Addendum: I reviewed blood work from 10/23/2015: Iron was mildly low at 50, TIBC elevated at 438, B12 648, vitamin D low normal at 31.39, PTH 52.2, CMP unremarkable, CBC with differential showed hematocrit of 44.1, platelets of 31, magnesium normal, cholesterol 247, triglycerides 39, LDL 182, A1c 5.9, TSH normal.   Her Past Medical History Is Significant For: Past Medical History:  Diagnosis Date  . Acid reflux   . Allergy   . Anemia   . Arthritis   . B12 deficiency   . Dysmetabolic syndrome X   . Lower back pain   . Vitamin D deficiency     Her Past Surgical History Is Significant For: Past Surgical History:  Procedure Laterality Date  . CESAREAN SECTION    . FOOT SURGERY Bilateral 1980's    Her Family History Is Significant For: Family History  Problem Relation Age of Onset  . Cancer Mother   . Hypertension Mother   .  Cancer Father   . Hyperlipidemia Sister   . Diabetes Sister   . Hypertension Sister   . Hyperlipidemia Brother   . Hypertension Brother     Her Social History Is Significant For: Social History   Social History  . Marital status: Married    Spouse name: N/A  . Number of children: 1  . Years of education: BA   Occupational History  . Retired     Social History Main Topics  . Smoking status: Former Research scientist (life sciences)  . Smokeless tobacco: Never Used  . Alcohol use Yes  . Drug use: No  . Sexual  activity: Not Asked   Other Topics Concern  . None   Social History Narrative   Occasional caffeine use     Her Allergies Are:  Allergies  Allergen Reactions  . Latex   . Shellfish Allergy Hives and Swelling  . Sulfonamide Derivatives   :   Her Current Medications Are:  Outpatient Encounter Prescriptions as of 12/15/2015  Medication Sig  . fluticasone (FLONASE) 50 MCG/ACT nasal spray Place into both nostrils daily.  . traMADol (ULTRAM) 50 MG tablet Take by mouth every 8 (eight) hours as needed.  . vitamin B-12 (CYANOCOBALAMIN) 1000 MCG tablet Take 1,000 mcg by mouth daily.  . [DISCONTINUED] cholecalciferol (VITAMIN D) 1000 units tablet Take 1,000 Units by mouth daily.  . [DISCONTINUED] phentermine 30 MG capsule TK 1 C PO QD   No facility-administered encounter medications on file as of 12/15/2015.   : Review of Systems:  Out of a complete 14 point review of systems, all are reviewed and negative with the exception of these symptoms as listed below:  Review of Systems  Neurological:       Patient states that she has had pain in her R thigh for past 6 months. Has some lower back pain, asks if it is related?     Objective:  Neurologic Exam  Physical Exam Physical Examination:   Vitals:   12/15/15 0826  BP: 126/80  Pulse: 82  Resp: 16    General Examination: The patient is a very pleasant 55 y.o. female in no acute distress. She appears well-developed and well-nourished and well groomed.   HEENT: Normocephalic, atraumatic, pupils are equal, round and reactive to light and accommodation. Funduscopic exam is normal with sharp disc margins noted. Extraocular tracking is good without limitation to gaze excursion or nystagmus noted. Normal smooth pursuit is noted. Hearing is grossly intact. Tympanic membranes are clear bilaterally. Face is symmetric with normal facial animation and normal facial sensation. Speech is clear with no dysarthria noted. There is no hypophonia. There  is no lip, neck/head, jaw or voice tremor. Neck is supple with full range of passive and active motion. There are no carotid bruits on auscultation. Oropharynx exam reveals: mild mouth dryness, good dental hygiene and mild airway crowding. Mallampati is class I. Tongue protrudes centrally and palate elevates symmetrically.   Chest: Clear to auscultation without wheezing, rhonchi or crackles noted.  Heart: S1+S2+0, regular and normal without murmurs, rubs or gallops noted.   Abdomen: Soft, non-tender and non-distended with normal bowel sounds appreciated on auscultation.  Extremities: There is no pitting edema in the distal lower extremities bilaterally. Pedal pulses are intact.  Skin: Warm and dry without trophic changes noted. There are no varicose veins.  Musculoskeletal: exam reveals no obvious joint deformities, tenderness or joint swelling or erythema. Upon inspection and palpation of her thighs, she has no redness, no swelling but has a  subjective feeling of swelling in her right thigh, no tenderness upon palpation, no knots or lesions, no abnormal warmth.   Neurologically:  Mental status: The patient is awake, alert and oriented in all 4 spheres. Her immediate and remote memory, attention, language skills and fund of knowledge are appropriate. There is no evidence of aphasia, agnosia, apraxia or anomia. Speech is clear with normal prosody and enunciation. Thought process is linear. Mood is normal and affect is normal.  Cranial nerves II - XII are as described above under HEENT exam. In addition: shoulder shrug is normal with equal shoulder height noted. Motor exam: Normal bulk, strength and tone is noted. There is no drift, tremor or rebound. Romberg is negative. Reflexes are 2+ throughout. Babinski: Toes are flexor bilaterally. Fine motor skills and coordination: intact with normal finger taps, normal hand movements, normal rapid alternating patting, normal foot taps and normal foot agility.   Cerebellar testing: No dysmetria or intention tremor on finger to nose testing. Heel to shin is unremarkable bilaterally. There is no truncal or gait ataxia.  Sensory exam: intact to light touch, pinprick, vibration, temperature sense in the upper and lower extremities.  Gait, station and balance: She stands easily. No veering to one side is noted. No leaning to one side is noted. Posture is age-appropriate and stance is narrow based. Gait shows normal stride length and normal pace. No problems turning are noted. Tandem walk is unremarkable. Intact toe and heel stance is noted.               Assessment and Plan:   In summary, Sinae Heisner is a very pleasant 55 y.o.-year old female with an underlying medical history of low back pain, obesity, vitamin D deficiency, anemia, impaired glucose tolerance, arthritis, reflux disease, allergic rhinitis, and hyperlipidemia, who Presents with a six-month history of right eye pain and paresthesias, with ongoing history of radiating back pain. 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 She is advised that there is no specific treatment for this other than trying to lose weight, and symptomatically she is encouraged to try the gabapentin that she was prescribed through your office. In addition, we will proceed with an EMG and nerve conduction test. Her lumbar spine MRI from a little over 2 years ago showed degenerative changes and particularly moderate left foraminal narrowing at L5 and S1, we could consider a repeat lumbar spine MRI. For now, we will proceed with EMG and nerve conduction testing and call her with her test results. Reassuringly, she has a normal exam. I will see her back routinely after this test is completed and in the interim she is encouraged to strive for weight loss.    I reviewed her blood test results. She had low platelets. Please follow-up with repeat testing.  I answered  all her questions today and the patient was in agreement with the plan.  Thank you very much for allowing me to participate in the care of this nice patient. If I can be of any further assistance to you please do not hesitate to call me at [phone].  Sincerely,   Star Age, MD, PhD

## 2015-12-24 ENCOUNTER — Encounter: Payer: 59 | Admitting: Neurology

## 2015-12-29 ENCOUNTER — Encounter: Payer: 59 | Admitting: Neurology

## 2016-01-27 ENCOUNTER — Ambulatory Visit (INDEPENDENT_AMBULATORY_CARE_PROVIDER_SITE_OTHER): Payer: Self-pay | Admitting: Neurology

## 2016-01-27 ENCOUNTER — Ambulatory Visit (INDEPENDENT_AMBULATORY_CARE_PROVIDER_SITE_OTHER): Payer: 59 | Admitting: Neurology

## 2016-01-27 DIAGNOSIS — M5417 Radiculopathy, lumbosacral region: Secondary | ICD-10-CM

## 2016-01-27 DIAGNOSIS — Z0289 Encounter for other administrative examinations: Secondary | ICD-10-CM

## 2016-01-27 DIAGNOSIS — M5441 Lumbago with sciatica, right side: Secondary | ICD-10-CM

## 2016-01-27 DIAGNOSIS — M79651 Pain in right thigh: Secondary | ICD-10-CM

## 2016-01-27 DIAGNOSIS — R208 Other disturbances of skin sensation: Secondary | ICD-10-CM

## 2016-01-27 DIAGNOSIS — G5711 Meralgia paresthetica, right lower limb: Secondary | ICD-10-CM | POA: Diagnosis not present

## 2016-01-27 NOTE — Progress Notes (Signed)
   STUDY DATE: 01/27/16 PATIENT NAME: Nicole Rasmussen DOB: [birth year] MRN: [id number]  REFERRED BY:  Star Age, MD, PhD  HISTORY:  Mrs. Sirbaugh is a 55 year old woman with a history of back pain who has noted right thigh pain and numbness for the past months.  On exam, she has altered sensation in the right anterolateral thigh. Strength and DTRs were normal and symmetric in the legs.  NERVE CONDUCTION STUDIES: The right peroneal motor response had normal distal latency, amplitude and conduction velocity. The right posterior tibial motor response had normal distal latency, amplitude and conduction velocity.  The right superficial peroneal sensory response had normal distal latency and amplitude. The right saphenous sensory response had normal distal latency and amplitude.  The left peroneal motor response had normal distal latency, amplitude and conduction velocity. The left posterior tibial motor response had normal distal latency, amplitude and conduction velocity.  The left superficial peroneal sensory response had normal distal latency and amplitude. The left saphenous sensory response had normal distal latency and amplitude.  The H reflex latencies were normal and symmetric   EMG STUDIES:  Needle EMG of selected muscles of the right leg was performed. Iliopsoas: No spontaneous activity. Normal motor unit morphology and recruitment. Gluteus medius:  No spontaneous activity. Increased number of polyphasic motor units with mildly neuropathic recruitment. Vastus lateralis:  No spontaneous activity. Normal motor unit morphology and recruitment. Anterior tibialis:  No spontaneous activity. Increased number of polyphasic motor units with mildly neuropathic recruitment. Peroneus longus:  No spontaneous activity. Increased number of polyphasic motor units with mildly neuropathic recruitment. Gastrocnemius:  No spontaneous activity. Normal motor unit morphology and  recruitment.  IMPRESSION:  This NCV/EMG study showed the following: 1.    No evidence of polyneuropathy.    2.    Mild chronic right L5 radiculopathy without active features. 3.    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   Richard A. Felecia Shelling, MD, PhD Certified in Neurology, [place] [facility], Sleep Medicine, Pain Medicine and Neuroimaging  [facility] [address], [place], [place] [address] [phone]

## 2016-03-16 ENCOUNTER — Ambulatory Visit: Payer: 59 | Admitting: Neurology

## 2016-04-05 ENCOUNTER — Other Ambulatory Visit: Payer: Self-pay | Admitting: Obstetrics and Gynecology

## 2016-04-05 DIAGNOSIS — N6310 Unspecified lump in the right breast, unspecified quadrant: Secondary | ICD-10-CM

## 2016-04-06 LAB — CYTOLOGY - PAP

## 2016-04-11 ENCOUNTER — Other Ambulatory Visit: Payer: Self-pay

## 2016-04-15 ENCOUNTER — Ambulatory Visit
Admission: RE | Admit: 2016-04-15 | Discharge: 2016-04-15 | Disposition: A | Discharge status: Home / Self Care | Payer: 59 | Source: Ambulatory Visit | Attending: Obstetrics and Gynecology | Admitting: Obstetrics and Gynecology

## 2016-04-15 DIAGNOSIS — N6310 Unspecified lump in the right breast, unspecified quadrant: Secondary | ICD-10-CM

## 2016-04-18 ENCOUNTER — Encounter: Payer: Self-pay | Admitting: Neurology

## 2016-04-18 ENCOUNTER — Ambulatory Visit (INDEPENDENT_AMBULATORY_CARE_PROVIDER_SITE_OTHER): Payer: 59 | Admitting: Neurology

## 2016-04-18 VITALS — BP 143/88 | HR 90 | Resp 29 | Ht 67.0 in | Wt 295.0 lb

## 2016-04-18 DIAGNOSIS — G8929 Other chronic pain: Secondary | ICD-10-CM

## 2016-04-18 DIAGNOSIS — M5442 Lumbago with sciatica, left side: Secondary | ICD-10-CM

## 2016-04-18 DIAGNOSIS — G5711 Meralgia paresthetica, right lower limb: Secondary | ICD-10-CM

## 2016-04-18 MED ORDER — GABAPENTIN 300 MG PO CAPS: 300.0000 mg | ORAL_CAPSULE | Freq: Three times a day (TID) | ORAL | 5 refills | Status: DC

## 2016-04-18 NOTE — Patient Instructions (Addendum)
Your EMG and nerve conduction velocity test showed no widespread neuropathy, which is reassuring.   We will do a lumbar spine MRI to look for degenerative changes and for comparison with your previous MRI from 2/15.   For symptomatic treatment of your nerve pain/irritation, re-start Neurontin (gabapentin) 300 mg strength: Take 1 pill nightly at bedtime for a few days, then 1 pill twice daily for a few days, then 1 pill 3 times daily thereafter. You can take it once or twice daily, don't have to titrate to 3 times a day.  The most common side effects reported are sedation or sleepiness. Rare side effects include balance problems, confusion.

## 2016-04-18 NOTE — Progress Notes (Signed)
Subjective:    Patient ID: Nicole Rasmussen is a 55 y.o. female.  HPI     Interim history:   Ms. Nicole Rasmussen is a 55 year old right-handed woman with an underlying medical history of low back pain, obesity, vitamin D deficiency, anemia, impaired glucose tolerance, arthritis, reflux disease, allergic rhinitis, and hyperlipidemia, who presents for follow-up consultation of her right thigh pain, concern for meralgia paresthetica. The patient is unaccompanied today. I first met her on 12/15/2015 at the request of her primary care physician, at which time the patient reported a 6 month history of right thigh pain as well as intermittent tingling and burning sensation. Her history and exam were concerning for meralgia paresthetica. She was advised to try to pursue weight loss and also try symptomatic treatment with gabapentin that she was prescribed by her PCP. I suggested we proceed with EMG nerve conduction testing. She had this on 01/27/2016 and I reviewed the results: IMPRESSION:   This NCV/EMG study showed the following: 1.    No evidence of polyneuropathy.    2.    Mild chronic right L5 radiculopathy without active features. 3.    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  Today, 04/18/2016: She reports having ongoing issues with right thigh pain, tingling in the lateral aspect of the right thigh but sometimes sudden onset of muscle spasm, which is brief but does catch her and her movement and she almost feels like she could fall at the time. Thankfully, she has not actually fallen. She has no other new symptoms with the exception of ongoing issues with low back pain, sometimes radiating to the left. We reviewed her lumbar spine MRI from February 2015 again today. She has been fluctuating with her weight, gained about 8 pounds in the past 4+ months.  Previously:  12/15/2015: She reports right thigh pain for the past 6 months. She describes an intermittent  tingling sensation, sometimes burning, sometimes a sensitivity to touch in the right frontal and lateral thigh area, sometimes she has radiating pain from the lower back to the right lateral leg and further down below the knee. She has not noticed restless leg symptoms, no foot drop, no actual weakness, no recent falls, no constant pain, no actual numbness. 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 She has mainly worked in the court room. She quit smoking some 16 years ago, does does not drink caffeine daily.  she tries to drink enough water. She does admit that she has gained weight in the past year. She is trying to lose weight.  She recalls having had an L spine MRI about a year ago. We will request the report from your office, also, you checked blood work, we will request test results on those too.    I reviewed your office note from 10/23/2015, which you kindly included. Symptomatic treatment includes gabapentin which she continues to take 300 mg strength, 1 pill 3 times a day. She has not tried it yet, has not picked it up.    Pain, tingling and burning is worse with prolonged standing, sometimes at work, more so at home, such as when cooking.    Addendum: I reviewed her Lumbar spine MRI report, she had a lumbar spine MRI without contrast at [facility] on 09/27/2013: Impression: This is an abnormal  MRI of the lumbar spine showing degenerative changes at L3-L4, L4-L5, and L5-S1 as detailed above. The most significant findings are at L5-S1 were severe facet hypertrophy causes moderate left foraminal narrowing that could lead to dynamic impingement of the left L5 nerve root.   Addendum: I reviewed blood work from 10/23/2015: Iron was mildly low at 50, TIBC elevated at 438, B12 648, vitamin D low normal at 31.39, PTH 52.2, CMP unremarkable, CBC with  differential showed hematocrit of 44.1, platelets of 31, magnesium normal, cholesterol 247, triglycerides 39, LDL 182, A1c 5.9, TSH normal.   Her Past Medical History Is Significant For: Past Medical History:  Diagnosis Date  . Acid reflux   . Allergy   . Anemia   . Arthritis   . B12 deficiency   . Dysmetabolic syndrome X   . Lower back pain   . Vitamin D deficiency     Her Past Surgical History Is Significant For: Past Surgical History:  Procedure Laterality Date  . CESAREAN SECTION    . FOOT SURGERY Bilateral 1980's    Her Family History Is Significant For: Family History  Problem Relation Age of Onset  . Cancer Mother   . Hypertension Mother   . Cancer Father   . Hyperlipidemia Sister   . Diabetes Sister   . Hypertension Sister   . Hyperlipidemia Brother   . Hypertension Brother     Her Social History Is Significant For: Social History   Social History  . Marital status: Married    Spouse name: N/A  . Number of children: 1  . Years of education: BA   Occupational History  . Retired     Social History Main Topics  . Smoking status: Former Research scientist (life sciences)  . Smokeless tobacco: Never Used  . Alcohol use Yes  . Drug use: No  . Sexual activity: Not Asked   Other Topics Concern  . None   Social History Narrative   Occasional caffeine use     Her Allergies Are:  Allergies  Allergen Reactions  . Latex   . Shellfish Allergy Hives and Swelling  . Sulfonamide Derivatives   :   Her Current Medications Are:  Outpatient Encounter Prescriptions as of 04/18/2016  Medication Sig  . fluticasone (FLONASE) 50 MCG/ACT nasal spray Place into both nostrils daily.  Marland Kitchen ibuprofen (ADVIL,MOTRIN) 200 MG tablet Take 200 mg by mouth every 6 (six) hours as needed.  . [DISCONTINUED] traMADol (ULTRAM) 50 MG tablet Take by mouth every 8 (eight) hours as needed.  . [DISCONTINUED] vitamin B-12 (CYANOCOBALAMIN) 1000 MCG tablet Take 1,000 mcg by mouth daily.   No facility-administered  encounter medications on file as of 04/18/2016.   :  Review of Systems:  Out of a complete 14 point review of systems, all are reviewed and negative with the exception of these symptoms as listed below: Review of Systems  Neurological:       Pt presents today to discuss her EMG/NCV results. She is still having pain in her right thigh.    Objective:  Neurologic Exam  Physical Exam Physical Examination:   Vitals:   04/18/16 1337  BP: (!) 143/88  Pulse: 90  Resp: (!) 29   General Examination: The patient is a very pleasant 55 y.o. female in no acute distress. She appears well-developed and well-nourished and well groomed. She is in good spirits today.  HEENT: Normocephalic, atraumatic, pupils are equal, round and reactive to light and accommodation. Extraocular tracking is good without limitation to  gaze excursion or nystagmus noted. Normal smooth pursuit is noted. Hearing is grossly intact. Face is symmetric with normal facial animation and normal facial sensation. Speech is clear with no dysarthria noted. There is no hypophonia. There is no lip, neck/head, jaw or voice tremor. Neck is supple with full range of passive and active motion. There are no carotid bruits on auscultation. Oropharynx exam reveals: mild mouth dryness, good dental hygiene and mild airway crowding. Mallampati is class I. Tongue protrudes centrally and palate elevates symmetrically.   Chest: Clear to auscultation without wheezing, rhonchi or crackles noted.  Heart: S1+S2+0, regular and normal without murmurs, rubs or gallops noted.   Abdomen: Soft, non-tender and non-distended with normal bowel sounds appreciated on auscultation.  Extremities: There is no pitting edema in the distal lower extremities bilaterally. Pedal pulses are intact.  Skin: Warm and dry without trophic changes noted. There are no varicose veins.  Musculoskeletal: exam reveals no obvious joint deformities, tenderness or joint swelling or  erythema. Upon inspection and palpation of her thighs, she has no redness, no swelling but has a subjective feeling of swelling in her right thigh, no tenderness upon palpation, no knots or lesions, no abnormal warmth.   Neurologically:  Mental status: The patient is awake, alert and oriented in all 4 spheres. Her immediate and remote memory, attention, language skills and fund of knowledge are appropriate. There is no evidence of aphasia, agnosia, apraxia or anomia. Speech is clear with normal prosody and enunciation. Thought process is linear. Mood is normal and affect is normal.  Cranial nerves II - XII are as described above under HEENT exam. In addition: shoulder shrug is normal with equal shoulder height noted. Motor exam: Normal bulk, strength and tone is noted. There is no drift, tremor or rebound. Romberg is negative. Reflexes are 2+ throughout. Babinski: Toes are flexor bilaterally. Fine motor skills and coordination: intact with normal finger taps, normal hand movements, normal rapid alternating patting, normal foot taps and normal foot agility.  Cerebellar testing: No dysmetria or intention tremor on finger to nose testing. Heel to shin is unremarkable bilaterally. There is no truncal or gait ataxia.  Sensory exam: intact to light touch, pinprick, vibration, temperature sense in the upper and lower extremities. One brief episode of abnormal sensation and tingling across the right lateral thigh while sitting. Gait, station and balance: She stands easily. No veering to one side is noted. No leaning to one side is noted. Posture is age-appropriate and stance is narrow based. Gait shows normal stride length and normal pace. No problems turning are noted. Tandem walk is unremarkable.            Assessment and Plan:   In summary, Ambermarie Honeyman is a very pleasant 55 year old female with an underlying medical history of low back pain, obesity, vitamin D deficiency, anemia, impaired glucose  tolerance, arthritis, reflux disease, allergic rhinitis, and hyperlipidemia, who presents for follow-up consultation of her right thigh pain and paresthesias, of approximately 10 months duration, with also a longer standing history of radiating back pain to the left. Clinically, she has meralgia paresthetica, recent EMG and nerve conduction tests from September 2017 was discussed, thankfully, no clinical sign and electrophysiological signs of widespread peripheral neuropathy and the patient was reassured in that regard. Nevertheless, she does have a history of degenerative spine disease, and she had a lumbar spine MRI in February 2015. I suggested we proceed with a repeat lumbar spine MRI to look for encroachment of the L5  nerve root on the right and progression of her degenerative spine disease overall. She also reports occasional hip pain. I suggested she address this with her primary care provider, possibly a consultation with orthopedics next. We will follow her clinically. She is encouraged to retry gabapentin, 300 mg strength one pill up to 3 times a day with gradual titration suggested. I renewed her gabapentin prescription and suggested a follow-up in a few months, we will keep her posted as to her lumbar spine MRI results via phone call. She is encouraged to strive for weight loss. She is advised to be cautious with the use of Mobic on a regular basis. I answered all her questions today and the patient was in agreement with the plan. I spent 25 minutes in total face-to-face time with the patient, more than 50% of which was spent in counseling and coordination of care, reviewing test results, reviewing medication and discussing or reviewing the diagnosis of meralgia paresthetica, low back pain, the prognosis and treatment options.

## 2016-04-20 ENCOUNTER — Other Ambulatory Visit: Payer: Self-pay

## 2016-04-20 DIAGNOSIS — M5442 Lumbago with sciatica, left side: Secondary | ICD-10-CM

## 2016-04-20 DIAGNOSIS — G8929 Other chronic pain: Secondary | ICD-10-CM

## 2016-04-20 DIAGNOSIS — G5711 Meralgia paresthetica, right lower limb: Secondary | ICD-10-CM

## 2016-04-20 NOTE — Progress Notes (Signed)
MRI Lumbar W Contrast was denied by pt's insurance. Dr. Rexene Alberts gave me a verbal order for MRI WO Contrast Lumbar Spine. Order placed.

## 2016-06-10 DIAGNOSIS — L811 Chloasma: Secondary | ICD-10-CM | POA: Diagnosis not present

## 2016-06-10 DIAGNOSIS — L719 Rosacea, unspecified: Secondary | ICD-10-CM | POA: Diagnosis not present

## 2016-06-10 DIAGNOSIS — L820 Inflamed seborrheic keratosis: Secondary | ICD-10-CM | POA: Diagnosis not present

## 2016-09-08 DIAGNOSIS — R0602 Shortness of breath: Secondary | ICD-10-CM | POA: Diagnosis not present

## 2016-09-08 DIAGNOSIS — J018 Other acute sinusitis: Secondary | ICD-10-CM | POA: Diagnosis not present

## 2016-09-08 DIAGNOSIS — J3089 Other allergic rhinitis: Secondary | ICD-10-CM | POA: Diagnosis not present

## 2016-09-26 DIAGNOSIS — Z1389 Encounter for screening for other disorder: Secondary | ICD-10-CM | POA: Diagnosis not present

## 2016-09-26 DIAGNOSIS — R3129 Other microscopic hematuria: Secondary | ICD-10-CM | POA: Diagnosis not present

## 2016-09-26 DIAGNOSIS — D696 Thrombocytopenia, unspecified: Secondary | ICD-10-CM | POA: Diagnosis not present

## 2016-09-26 DIAGNOSIS — Z0000 Encounter for general adult medical examination without abnormal findings: Secondary | ICD-10-CM | POA: Diagnosis not present

## 2016-09-26 DIAGNOSIS — R0602 Shortness of breath: Secondary | ICD-10-CM | POA: Diagnosis not present

## 2016-09-28 DIAGNOSIS — H1032 Unspecified acute conjunctivitis, left eye: Secondary | ICD-10-CM | POA: Diagnosis not present

## 2016-09-28 DIAGNOSIS — E782 Mixed hyperlipidemia: Secondary | ICD-10-CM | POA: Diagnosis not present

## 2016-09-28 DIAGNOSIS — J3089 Other allergic rhinitis: Secondary | ICD-10-CM | POA: Diagnosis not present

## 2016-10-13 ENCOUNTER — Telehealth: Payer: Self-pay | Admitting: *Deleted

## 2016-10-13 NOTE — Telephone Encounter (Signed)
I spoke to Dr. Rexene Alberts and she agrees that the MRI needs to be completed before the pt follows up. I called pt. She says that [facility] is charging her $200 and she has never been charged for her MRIs before. Pt is asking for Raquel Sarna to call her back and discuss this and getting pt scheduled again for her MRI. After we get the MRI, I can call and schedule pt for her follow up. Pt verbalized understanding and is agreeable to this plan.

## 2016-10-17 ENCOUNTER — Ambulatory Visit: Payer: 59 | Admitting: Neurology

## 2016-10-17 NOTE — Telephone Encounter (Signed)
Noted, thanks!

## 2016-10-17 NOTE — Telephone Encounter (Signed)
I spoke with the patient and informed her that I called her insurance and spoke with Thurmond Butts with [facility] and he informed me she is covered at 100% the reference # to our call was [id number]. I told her that I will send the order to [facility] and they will contact her to schedule.. She understood I told her I would let Dr. Rexene Alberts nurse aware of this.Marland Kitchen

## 2016-10-25 DIAGNOSIS — H1045 Other chronic allergic conjunctivitis: Secondary | ICD-10-CM | POA: Diagnosis not present

## 2016-11-17 ENCOUNTER — Ambulatory Visit
Admission: RE | Admit: 2016-11-17 | Discharge: 2016-11-17 | Disposition: A | Discharge status: Home / Self Care | Payer: 59 | Source: Ambulatory Visit | Attending: Neurology | Admitting: Neurology

## 2016-11-17 DIAGNOSIS — M5442 Lumbago with sciatica, left side: Secondary | ICD-10-CM

## 2016-11-17 DIAGNOSIS — M545 Low back pain: Secondary | ICD-10-CM | POA: Diagnosis not present

## 2016-11-17 DIAGNOSIS — G8929 Other chronic pain: Secondary | ICD-10-CM

## 2016-11-17 DIAGNOSIS — G5711 Meralgia paresthetica, right lower limb: Secondary | ICD-10-CM

## 2016-11-21 ENCOUNTER — Telehealth: Payer: Self-pay

## 2016-11-21 DIAGNOSIS — M47816 Spondylosis without myelopathy or radiculopathy, lumbar region: Secondary | ICD-10-CM

## 2016-11-21 NOTE — Telephone Encounter (Signed)
Referral to ortho placed. Please process.

## 2016-11-21 NOTE — Telephone Encounter (Signed)
I called pt. I advised her that her MRI L spine revealed degenerative changes, mostly on the left side and levels L5/S1 and L3/4 and that pt may want to consult with her back/spine specialist. Pt does not have an orthopedic surgeon and wants [name] to make this referral. Pt verbalized understanding of results. Pt had no questions at this time but was encouraged to call back if questions arise.

## 2016-11-21 NOTE — Telephone Encounter (Signed)
-----   Message from Star Age, MD sent at 11/21/2016  8:09 AM EDT ----- Please call pt re: MRI L spine: there were degenerative changes, mostly on the L, at levels L5/S1 and L3/4, she may want to consult with her spine/back specialist. If she does not have an orthopedic surgeon, we can make a referral or if she prefers, her PCP can make a referral.  Star Age, MD, PhD [facility] ([place])

## 2016-11-21 NOTE — Progress Notes (Signed)
Please call pt re: MRI L spine: there were degenerative changes, mostly on the L, at levels L5/S1 and L3/4, she may want to consult with her spine/back specialist. If she does not have an orthopedic surgeon, we can make a referral or if she prefers, her PCP can make a referral.  Star Age, MD, PhD [facility] ([place])

## 2016-11-22 NOTE — Telephone Encounter (Signed)
Referral has been sent to [place] . Telephone [phone] - fax [phone].

## 2016-12-25 DIAGNOSIS — M542 Cervicalgia: Secondary | ICD-10-CM | POA: Diagnosis not present

## 2017-01-24 ENCOUNTER — Encounter (INDEPENDENT_AMBULATORY_CARE_PROVIDER_SITE_OTHER): Payer: Self-pay

## 2017-02-09 ENCOUNTER — Encounter (INDEPENDENT_AMBULATORY_CARE_PROVIDER_SITE_OTHER): Payer: Self-pay | Admitting: Family Medicine

## 2017-02-09 ENCOUNTER — Ambulatory Visit (INDEPENDENT_AMBULATORY_CARE_PROVIDER_SITE_OTHER): Payer: 59 | Admitting: Family Medicine

## 2017-02-09 VITALS — BP 134/84 | HR 72 | Temp 98.4°F | Ht 67.0 in | Wt 281.0 lb

## 2017-02-09 DIAGNOSIS — Z9189 Other specified personal risk factors, not elsewhere classified: Secondary | ICD-10-CM | POA: Diagnosis not present

## 2017-02-09 DIAGNOSIS — Z1331 Encounter for screening for depression: Secondary | ICD-10-CM

## 2017-02-09 DIAGNOSIS — R5383 Other fatigue: Secondary | ICD-10-CM

## 2017-02-09 DIAGNOSIS — E559 Vitamin D deficiency, unspecified: Secondary | ICD-10-CM | POA: Diagnosis not present

## 2017-02-09 DIAGNOSIS — Z6841 Body Mass Index (BMI) 40.0 and over, adult: Secondary | ICD-10-CM | POA: Diagnosis not present

## 2017-02-09 DIAGNOSIS — Z0289 Encounter for other administrative examinations: Secondary | ICD-10-CM

## 2017-02-09 DIAGNOSIS — R0602 Shortness of breath: Secondary | ICD-10-CM | POA: Diagnosis not present

## 2017-02-09 DIAGNOSIS — R739 Hyperglycemia, unspecified: Secondary | ICD-10-CM | POA: Insufficient documentation

## 2017-02-09 NOTE — Progress Notes (Signed)
Office: [phone]  /  Fax: [phone]   Dear Dr. Tamala Julian,   Thank you for referring Nicole Rasmussen to our clinic. The following note includes my evaluation and treatment recommendations.  HPI:   Chief Complaint: OBESITY    Nicole Rasmussen has been referred by Glendon Axe, MD for consultation regarding her obesity and obesity related comorbidities.    Nicole Rasmussen (MR# [id number]) is a 56 y.o. female who presents on 02/09/2017 for obesity evaluation and treatment. Current BMI is Body mass index is 44.01 kg/m.Marland Kitchen Nicole Rasmussen has been struggling with her weight for many years and has been unsuccessful in either losing weight, maintaining weight loss, or reaching her healthy weight goal.     Nicole Rasmussen attended our information session and states she is currently in the action stage of change and ready to dedicate time achieving and maintaining a healthier weight. Nicole Rasmussen is interested in becoming our patient and working on intensive lifestyle modifications including (but not limited to) diet, exercise and weight loss.    Abagale states her family eats meals together she thinks her family will eat healthier with  her her desired weight loss is 103 to 113 she has been heavy most of  her life she started gaining weight as a young girl she has significant food cravings issues  she snacks frequently in the evenings she skips meals frequently she is frequently drinking liquids with calories she frequently makes poor food choices she frequently eats larger portions than normal  she has binge eating behaviors she struggles with emotional eating    Fatigue Azayla feels her energy is lower than it should be. This has worsened with weight gain and has not worsened recently. Anderia admits to daytime somnolence and denies waking up still tired. Patient is at risk for obstructive sleep apnea. Patent has a history of symptoms of daytime fatigue and morning headache. Patient generally gets 6 hours of  sleep per night, and states they generally have restful sleep. Snoring is present. Apneic episodes are present. Epworth Sleepiness Score is 3  Dyspnea on exertion Temperance notes increasing shortness of breath with exercising and seems to be worsening over time with weight gain. She notes getting out of breath sooner with activity than she used to. This has not gotten worse recently. Nicole Rasmussen denies orthopnea.  Vitamin D deficiency Nicole Rasmussen has a diagnosis of vitamin D deficiency. She is not currently taking vit D and admits fatigue but denies nausea, vomiting or muscle weakness.  Hyperglycemia Nicole Rasmussen has a history of some elevated blood glucose readings without a diagnosis of diabetes. She is not currently taking metformin and admits to polyphagia.  At risk for diabetes Nicole Rasmussen is at higher than average risk for developing diabetes due to her obesity. She currently denies polyuria or polydipsia.   Depression Screen Nicole Rasmussen's Food and Mood (modified PHQ-9) score was  Depression screen PHQ 2/9 02/09/2017  Decreased Interest 0  Down, Depressed, Hopeless 0  PHQ - 2 Score 0  Altered sleeping 1  Tired, decreased energy 1  Change in appetite 1  Feeling bad or failure about yourself  0  Trouble concentrating 0  Moving slowly or fidgety/restless 0  Suicidal thoughts 0  PHQ-9 Score 3  Difficult doing work/chores Not difficult at all    ALLERGIES: Allergies  Allergen Reactions  . Latex   . Shellfish Allergy Hives and Swelling  . Sulfonamide Derivatives     MEDICATIONS: Current Outpatient Prescriptions on File Prior to Visit  Medication Sig Dispense Refill  . gabapentin (  NEURONTIN) 300 MG capsule Take 1 capsule (300 mg total) by mouth 3 (three) times daily. Can take once daily or 2 times a day, up to 3 times a day. 90 capsule 5  . ibuprofen (ADVIL,MOTRIN) 200 MG tablet Take 200 mg by mouth every 6 (six) hours as needed.     No current facility-administered medications on file prior to visit.      PAST MEDICAL HISTORY: Past Medical History:  Diagnosis Date  . Acid reflux   . Allergy   . Anemia   . Arthritis   . Asthma   . B12 deficiency   . Dysmetabolic syndrome X   . Dyspnea   . Food allergy    shellfish  . GERD (gastroesophageal reflux disease)   . HLD (hyperlipidemia)   . Joint pain   . Lower back pain   . Nerve pain    top of right thigh  . Prediabetes   . Sinus problem   . Vitamin B 12 deficiency   . Vitamin D deficiency     PAST SURGICAL HISTORY: Past Surgical History:  Procedure Laterality Date  . CESAREAN SECTION    . FOOT SURGERY Bilateral 1980's  . MOUTH SURGERY     2011    SOCIAL HISTORY: Social History  Substance Use Topics  . Smoking status: Former Smoker    Packs/day: 1.00    Years: 20.00    Types: Cigarettes    Quit date: 05/02/1996  . Smokeless tobacco: Never Used  . Alcohol use Yes    FAMILY HISTORY: Family History  Problem Relation Age of Onset  . Cancer Mother   . Hypertension Mother   . Hyperlipidemia Mother   . Heart disease Mother   . Cancer Father   . Kidney disease Father   . Hyperlipidemia Sister   . Diabetes Sister   . Hypertension Sister   . Hyperlipidemia Brother   . Hypertension Brother     ROS: Review of Systems  Constitutional: Positive for malaise/fatigue.  HENT: Positive for congestion (nasal stuffiness) and sinus pain.   Eyes: Positive for redness.       Wear Glasses or Contacts  Respiratory: Positive for shortness of breath (on exertion) and wheezing.   Cardiovascular: Negative for orthopnea.       Sudden Awakening from Sleep with Shortness of Breath Leg Cramping  Gastrointestinal: Positive for heartburn. Negative for nausea and vomiting.  Genitourinary: Negative for frequency.  Musculoskeletal: Positive for back pain and neck pain.       Muscle or Joint Pain Muscle Stiffness Red or Swollen Joints Negative muscle weakness  Skin:       Dryness   Endo/Heme/Allergies: Negative for polydipsia.  Bruises/bleeds easily (easy bruising).       Positive polyphagia    PHYSICAL EXAM: Blood pressure 134/84, pulse 72, temperature 98.4 F (36.9 C), temperature source Oral, height 5\' 7"  (1.702 m), weight 281 lb (127.5 kg), last menstrual period 10/11/2010, SpO2 99 %. Body mass index is 44.01 kg/m. Physical Exam  Constitutional: She is oriented to person, place, and time. She appears well-developed and well-nourished.  Cardiovascular: Normal rate.   Pulmonary/Chest: Effort normal.  Musculoskeletal: Normal range of motion.  Neurological: She is oriented to person, place, and time.  Skin: Skin is warm and dry.  Psychiatric: She has a normal mood and affect. Her behavior is normal.  Vitals reviewed.   RECENT LABS AND TESTS: BMET    Component Value Date/Time   NA 145 06/08/2013 2035  K 4.1 06/08/2013 2035   CL 106 06/08/2013 2035   CO2 26 06/08/2013 2035   GLUCOSE 99 06/08/2013 2035   BUN 16 06/08/2013 2035   CREATININE 0.70 06/08/2013 2035   CALCIUM 9.9 06/08/2013 2035   GFRNONAA >90 06/08/2013 2035   GFRAA >90 06/08/2013 2035   No results found for: HGBA1C No results found for: INSULIN CBC    Component Value Date/Time   WBC 9.7 06/08/2013 2035   RBC 4.70 06/08/2013 2035   HGB 12.8 06/08/2013 2035   HCT 39.8 06/08/2013 2035   PLT  06/08/2013 2035    PLATELET CLUMPS NOTED ON SMEAR, COUNT APPEARS ADEQUATE   MCV 84.7 06/08/2013 2035   MCV 89.7 05/19/2013 1835   MCH 27.2 06/08/2013 2035   MCHC 32.2 06/08/2013 2035   RDW 14.1 06/08/2013 2035   LYMPHSABS 4.1 (H) 06/08/2013 2035   MONOABS 0.8 06/08/2013 2035   EOSABS 0.2 06/08/2013 2035   BASOSABS 0.0 06/08/2013 2035   Iron/TIBC/Ferritin/ %Sat No results found for: IRON, TIBC, FERRITIN, IRONPCTSAT Lipid Panel  No results found for: CHOL, TRIG, HDL, CHOLHDL, VLDL, LDLCALC, LDLDIRECT Hepatic Function Panel     Component Value Date/Time   PROT 7.5 06/08/2013 2035   ALBUMIN 3.9 06/08/2013 2035   AST 20 06/08/2013 2035    ALT 20 06/08/2013 2035   ALKPHOS 96 06/08/2013 2035   BILITOT 0.3 06/08/2013 2035   No results found for: TSH  ECG  shows NSR with a rate of 71 BPM INDIRECT CALORIMETER done today shows a VO2 of 203 and a REE of 1411.  Her calculated basal metabolic rate is 0093 thus her basal metabolic rate is worse than expected.    ASSESSMENT AND PLAN: Other fatigue - Plan: EKG 12-Lead, Vitamin B12, CBC With Differential, Folate, Lipid Panel With LDL/HDL Ratio, T3, T4, free, TSH  Shortness of breath on exertion - Plan: Lipid Panel With LDL/HDL Ratio  Hyperglycemia - Plan: Comprehensive metabolic panel, Hemoglobin A1c, Insulin, random  Vitamin D deficiency - Plan: VITAMIN D 25 Hydroxy (Vit-D Deficiency, Fractures)  Depression screening  At risk for diabetes mellitus  Class 3 severe obesity with serious comorbidity and body mass index (BMI) of 40.0 to 44.9 in adult, unspecified obesity type (HCC)  PLAN: Fatigue Ahliyah was informed that her fatigue may be related to obesity, depression or many other causes. Labs will be ordered, and in the meanwhile Alauna has agreed to work on diet, exercise and weight loss to help with fatigue. Proper sleep hygiene was discussed including the need for 7-8 hours of quality sleep each night. A sleep study was not ordered based on symptoms and Epworth score.  Dyspnea on exertion Mahayla's shortness of breath appears to be obesity related and exercise induced. She has agreed to work on weight loss and gradually increase exercise to treat her exercise induced shortness of breath. If Nicole Rasmussen follows our instructions and loses weight without improvement of her shortness of breath, we will plan to refer to pulmonology. We will monitor this condition regularly. Nicole Rasmussen agrees to this plan.  Vitamin D Deficiency Nicole Rasmussen was informed that low vitamin D levels contributes to fatigue and are associated with obesity, breast, and colon cancer. We will check labs and will follow up  for routine testing of vitamin D, at least 2-3 times per year. Nicole Rasmussen agrees to follow up with our clinic in 2 weeks.  Hyperglycemia Fasting labs will be obtained and results with be discussed with Nicole Rasmussen in 2 weeks at her follow  up visit. In the meanwhile Nicole Rasmussen was started on a lower simple carbohydrate diet and will work on weight loss efforts.  Diabetes risk counseling Nicole Rasmussen was given extended (15 minutes) diabetes prevention counseling today. She is 56 y.o. female and has risk factors for diabetes including obesity. We discussed intensive lifestyle modifications today with an emphasis on weight loss as well as increasing exercise and decreasing simple carbohydrates in her diet.  Depression Screen Nicole Rasmussen had a negative depression screening. Depression is commonly associated with obesity and often results in emotional eating behaviors. We will monitor this closely and work on CBT to help improve the non-hunger eating patterns. Referral to Psychology may be required if no improvement is seen as she continues in our clinic.  Obesity Nicole Rasmussen is currently in the action stage of change and her goal is to continue with weight loss efforts. I recommend Nicole Rasmussen begin the structured treatment plan as follows:  She has agreed to follow the Category 2 plan +100 calories Nicole Rasmussen has been instructed to eventually work up to a goal of 150 minutes of combined cardio and strengthening exercise per week for weight loss and overall health benefits. We discussed the following Behavioral Modification Strategies today: increasing lean protein intake, decreasing simple carbohydrates  and work on meal planning and easy cooking plans   She was informed of the importance of frequent follow up visits to maximize her success with intensive lifestyle modifications for her multiple health conditions. She was informed we would discuss her lab results at her next visit unless there is a critical issue that needs to be addressed  sooner. Nicole Rasmussen agreed to keep her next visit at the agreed upon time to discuss these results.  I, Doreene Nest, am acting as transcriptionist for  Dennard Nip, MD  I have reviewed the above documentation for accuracy and completeness, and I agree with the above. -Dennard Nip, MD    OBESITY BEHAVIORAL INTERVENTION VISIT  Today's visit was # 1 out of 33.  Starting weight: 281 lbs Starting date: 02/09/17 Today's weight : 281 lbs  Today's date: 02/09/2017 Total lbs lost to date: 0 (Patients must lose 7 lbs in the first 6 months to continue with counseling)   ASK: We discussed the diagnosis of obesity with Nicole Rasmussen Crow today and Tinzley agreed to give [name] permission to discuss obesity behavioral modification therapy today.  ASSESS: Nicole Rasmussen has the diagnosis of obesity and her BMI today is [place] is in the action stage of change   ADVISE: Sophya was educated on the multiple health risks of obesity as well as the benefit of weight loss to improve her health. She was advised of the need for long term treatment and the importance of lifestyle modifications.  AGREE: Multiple dietary modification options and treatment options were discussed and  Dianelys agreed to follow the Category 2 plan +100 calories We discussed the following Behavioral Modification Strategies today: increasing lean protein intake, decreasing simple carbohydrates  and work on meal planning and easy cooking plans

## 2017-02-10 LAB — INSULIN, RANDOM: INSULIN: 9.5 u[IU]/mL (ref 2.6–24.9)

## 2017-02-10 LAB — LIPID PANEL WITH LDL/HDL RATIO
Cholesterol, Total: 216 mg/dL — ABNORMAL HIGH (ref 100–199)
HDL: 43 mg/dL (ref >39)
LDL CALC: 160 mg/dL — AB (ref 0–99)
LDL/HDL RATIO: 3.7 ratio — AB (ref 0.0–3.2)
Triglycerides: 66 mg/dL (ref 0–149)
VLDL Cholesterol Cal: 13 mg/dL (ref 5–40)

## 2017-02-10 LAB — CBC WITH DIFFERENTIAL
BASOS ABS: 0 10*3/uL (ref 0.0–0.2)
Basos: 0 %
EOS (ABSOLUTE): 0.1 10*3/uL (ref 0.0–0.4)
EOS: 2 %
HEMATOCRIT: 42.3 % (ref 34.0–46.6)
Hemoglobin: 13.3 g/dL (ref 11.1–15.9)
IMMATURE GRANULOCYTES: 0 %
Immature Grans (Abs): 0 10*3/uL (ref 0.0–0.1)
LYMPHS ABS: 2.9 10*3/uL (ref 0.7–3.1)
Lymphs: 41 %
MCH: 26.8 pg (ref 26.6–33.0)
MCHC: 31.4 g/dL — AB (ref 31.5–35.7)
MCV: 85 fL (ref 79–97)
MONOS ABS: 0.3 10*3/uL (ref 0.1–0.9)
Monocytes: 5 %
NEUTROS PCT: 52 %
Neutrophils Absolute: 3.8 10*3/uL (ref 1.4–7.0)
RBC: 4.97 x10E6/uL (ref 3.77–5.28)
RDW: 14.7 % (ref 12.3–15.4)
WBC: 7.2 10*3/uL (ref 3.4–10.8)

## 2017-02-10 LAB — VITAMIN D 25 HYDROXY (VIT D DEFICIENCY, FRACTURES): Vit D, 25-Hydroxy: 21.4 ng/mL — ABNORMAL LOW (ref 30.0–100.0)

## 2017-02-10 LAB — COMPREHENSIVE METABOLIC PANEL
A/G RATIO: 1.6 (ref 1.2–2.2)
ALK PHOS: 85 IU/L (ref 39–117)
ALT: 24 IU/L (ref 0–32)
AST: 17 IU/L (ref 0–40)
Albumin: 4.3 g/dL (ref 3.5–5.5)
BUN/Creatinine Ratio: 14 (ref 9–23)
BUN: 9 mg/dL (ref 6–24)
Bilirubin Total: 0.4 mg/dL (ref 0.0–1.2)
CO2: 23 mmol/L (ref 20–29)
CREATININE: 0.66 mg/dL (ref 0.57–1.00)
Calcium: 9.4 mg/dL (ref 8.7–10.2)
Chloride: 103 mmol/L (ref 96–106)
GFR calc Af Amer: 113 mL/min/{1.73_m2} (ref >59)
GFR calc non Af Amer: 98 mL/min/{1.73_m2} (ref >59)
GLOBULIN, TOTAL: 2.7 g/dL (ref 1.5–4.5)
Glucose: 85 mg/dL (ref 65–99)
POTASSIUM: 3.7 mmol/L (ref 3.5–5.2)
Sodium: 139 mmol/L (ref 134–144)
Total Protein: 7 g/dL (ref 6.0–8.5)

## 2017-02-10 LAB — T3: T3, Total: 95 ng/dL (ref 71–180)

## 2017-02-10 LAB — VITAMIN B12: VITAMIN B 12: 465 pg/mL (ref 232–1245)

## 2017-02-10 LAB — FOLATE: FOLATE: 5.6 ng/mL (ref >3.0)

## 2017-02-10 LAB — T4, FREE: FREE T4: 0.97 ng/dL (ref 0.82–1.77)

## 2017-02-10 LAB — HEMOGLOBIN A1C
ESTIMATED AVERAGE GLUCOSE: 123 mg/dL
Hgb A1c MFr Bld: 5.9 % — ABNORMAL HIGH (ref 4.8–5.6)

## 2017-02-10 LAB — TSH: TSH: 1.23 u[IU]/mL (ref 0.450–4.500)

## 2017-02-23 ENCOUNTER — Ambulatory Visit (INDEPENDENT_AMBULATORY_CARE_PROVIDER_SITE_OTHER): Payer: 59 | Admitting: Family Medicine

## 2017-02-23 VITALS — BP 126/85 | HR 71 | Temp 97.9°F | Ht 67.0 in | Wt 275.0 lb

## 2017-02-23 DIAGNOSIS — F3289 Other specified depressive episodes: Secondary | ICD-10-CM

## 2017-02-23 DIAGNOSIS — Z6841 Body Mass Index (BMI) 40.0 and over, adult: Secondary | ICD-10-CM | POA: Diagnosis not present

## 2017-02-23 DIAGNOSIS — Z9189 Other specified personal risk factors, not elsewhere classified: Secondary | ICD-10-CM

## 2017-02-23 DIAGNOSIS — E7849 Other hyperlipidemia: Secondary | ICD-10-CM

## 2017-02-23 DIAGNOSIS — R7303 Prediabetes: Secondary | ICD-10-CM

## 2017-02-23 DIAGNOSIS — E559 Vitamin D deficiency, unspecified: Secondary | ICD-10-CM | POA: Diagnosis not present

## 2017-02-23 MED ORDER — BUPROPION HCL ER (SR) 150 MG PO TB12: 150.0000 mg | ORAL_TABLET | Freq: Every day | ORAL | 0 refills | Status: DC

## 2017-02-23 MED ORDER — VITAMIN D (ERGOCALCIFEROL) 1.25 MG (50000 UNIT) PO CAPS: 50000.0000 [IU] | ORAL_CAPSULE | ORAL | 0 refills | Status: DC

## 2017-02-23 MED ORDER — COENZYME Q10 300 MG PO CAPS: 1.0000 | ORAL_CAPSULE | Freq: Every day | ORAL | 0 refills | Status: DC

## 2017-02-27 NOTE — Progress Notes (Signed)
Office: [phone]  /  Fax: [phone]   HPI:   Chief Complaint: OBESITY Nicole Rasmussen is here to discuss her progress with her obesity treatment plan. She is on the Category 2 plan and is following her eating plan approximately 100 % of the time. She states she is walking 15 minutes 1 time per week. Nicole Rasmussen did well with weight loss on the Category 2 plan. She wasn't hungry and often ate breakfast late but she did make sure to eat it. Her weight is 275 lb (124.7 kg) today and has had a weight loss of 6 pounds over a period of 2 weeks since her last visit. She has lost 6 lbs since starting treatment with [name].  Vitamin D deficiency Nicole Rasmussen has a diagnosis of vitamin D deficiency. She is not currently taking vit D and she admits fatigue but denies nausea, vomiting or muscle weakness.  Hyperlipidemia Nicole Rasmussen has hyperlipidemia and has been trying to improve her cholesterol levels with intensive lifestyle modification including a low saturated fat diet, exercise and weight loss. Her LDL is elevated, she is not taking Crestor due to myalgia She denies any chest pain or claudication.  Pre-Diabetes Nicole Rasmussen has a diagnosis of prediabetes based on her elevated Hgb A1c and was informed this puts her at greater risk of developing diabetes. Her A1c is at 5.9 and she notes polyphagia, but this has improved on diet prescription. She is not taking metformin currently and continues to work on diet and exercise to decrease risk of diabetes. She denies nausea or hypoglycemia.  At risk for diabetes Nicole Rasmussen is at higher than average risk for developing diabetes due to her obesity and pre-diabetes. She currently denies polyuria or polydipsia.  Depression with emotional eating behaviors Nicole Rasmussen is struggling with increased emotional eating and stress eating and using food for comfort to the extent that it is negatively impacting her health. She often snacks when she is not hungry. Nicole Rasmussen sometimes feels she is out of control and  then feels guilty that she made poor food choices. She has been working on behavior modification techniques to help reduce her emotional eating and has been somewhat successful. Her mood is stable and she shows no sign of suicidal or homicidal ideations.  Depression screen PHQ 2/9 02/09/2017  Decreased Interest 0  Down, Depressed, Hopeless 0  PHQ - 2 Score 0  Altered sleeping 1  Tired, decreased energy 1  Change in appetite 1  Feeling bad or failure about yourself  0  Trouble concentrating 0  Moving slowly or fidgety/restless 0  Suicidal thoughts 0  PHQ-9 Score 3  Difficult doing work/chores Not difficult at all   ALLERGIES: Allergies  Allergen Reactions  . Latex   . Shellfish Allergy Hives and Swelling  . Sulfonamide Derivatives     MEDICATIONS: Current Outpatient Prescriptions on File Prior to Visit  Medication Sig Dispense Refill  . diclofenac (VOLTAREN) 75 MG EC tablet Take 75 mg by mouth 2 (two) times daily.    . fexofenadine (ALLEGRA) 180 MG tablet Take 180 mg by mouth daily.    Marland Kitchen gabapentin (NEURONTIN) 300 MG capsule Take 1 capsule (300 mg total) by mouth 3 (three) times daily. Can take once daily or 2 times a day, up to 3 times a day. 90 capsule 5  . ibuprofen (ADVIL,MOTRIN) 200 MG tablet Take 200 mg by mouth every 6 (six) hours as needed.    . meloxicam (MOBIC) 15 MG tablet Take 15 mg by mouth daily.    Marland Kitchen  rosuvastatin (CRESTOR) 10 MG tablet Take 10 mg by mouth daily.     No current facility-administered medications on file prior to visit.     PAST MEDICAL HISTORY: Past Medical History:  Diagnosis Date  . Acid reflux   . Allergy   . Anemia   . Arthritis   . Asthma   . B12 deficiency   . Dysmetabolic syndrome X   . Dyspnea   . Food allergy    shellfish  . GERD (gastroesophageal reflux disease)   . HLD (hyperlipidemia)   . Joint pain   . Lower back pain   . Nerve pain    top of right thigh  . Prediabetes   . Sinus problem   . Vitamin B 12 deficiency     . Vitamin D deficiency     PAST SURGICAL HISTORY: Past Surgical History:  Procedure Laterality Date  . CESAREAN SECTION    . FOOT SURGERY Bilateral 1980's  . MOUTH SURGERY     2011    SOCIAL HISTORY: Social History  Substance Use Topics  . Smoking status: Former Smoker    Packs/day: 1.00    Years: 20.00    Types: Cigarettes    Quit date: 05/02/1996  . Smokeless tobacco: Never Used  . Alcohol use Yes    FAMILY HISTORY: Family History  Problem Relation Age of Onset  . Cancer Mother   . Hypertension Mother   . Hyperlipidemia Mother   . Heart disease Mother   . Cancer Father   . Kidney disease Father   . Hyperlipidemia Sister   . Diabetes Sister   . Hypertension Sister   . Hyperlipidemia Brother   . Hypertension Brother     ROS: Review of Systems  Constitutional: Positive for malaise/fatigue and weight loss.  Cardiovascular: Negative for chest pain and claudication.  Gastrointestinal: Negative for nausea and vomiting.  Genitourinary: Negative for frequency.  Musculoskeletal: Positive for myalgias.       Negative muscle weakness  Endo/Heme/Allergies: Negative for polydipsia.       Positive polyphagia Negative hypoglycemia  Psychiatric/Behavioral: Positive for depression. Negative for suicidal ideas.    PHYSICAL EXAM: Blood pressure 126/85, pulse 71, temperature 97.9 F (36.6 C), temperature source Oral, height 5\' 7"  (1.702 m), weight 275 lb (124.7 kg), last menstrual period 10/11/2010, SpO2 98 %. Body mass index is 43.07 kg/m. Physical Exam  Constitutional: She is oriented to person, place, and time. She appears well-developed and well-nourished.  Cardiovascular: Normal rate.   Pulmonary/Chest: Effort normal.  Musculoskeletal: Normal range of motion.  Neurological: She is oriented to person, place, and time.  Skin: Skin is warm and dry.  Psychiatric: She has a normal mood and affect.  Vitals reviewed.   RECENT LABS AND TESTS: BMET    Component Value  Date/Time   NA 139 02/09/2017 1250   K 3.7 02/09/2017 1250   CL 103 02/09/2017 1250   CO2 23 02/09/2017 1250   GLUCOSE 85 02/09/2017 1250   GLUCOSE 99 06/08/2013 2035   BUN 9 02/09/2017 1250   CREATININE 0.66 02/09/2017 1250   CALCIUM 9.4 02/09/2017 1250   GFRNONAA 98 02/09/2017 1250   GFRAA 113 02/09/2017 1250   Lab Results  Component Value Date   HGBA1C 5.9 (H) 02/09/2017   Lab Results  Component Value Date   INSULIN 9.5 02/09/2017   CBC    Component Value Date/Time   WBC 7.2 02/09/2017 1250   WBC 9.7 06/08/2013 2035   RBC 4.97 02/09/2017  1250   RBC 4.70 06/08/2013 2035   HGB 13.3 02/09/2017 1250   HCT 42.3 02/09/2017 1250   PLT  06/08/2013 2035    PLATELET CLUMPS NOTED ON SMEAR, COUNT APPEARS ADEQUATE   MCV 85 02/09/2017 1250   MCH 26.8 02/09/2017 1250   MCH 27.2 06/08/2013 2035   MCHC 31.4 (Nicole Rasmussen) 02/09/2017 1250   MCHC 32.2 06/08/2013 2035   RDW 14.7 02/09/2017 1250   LYMPHSABS 2.9 02/09/2017 1250   MONOABS 0.8 06/08/2013 2035   EOSABS 0.1 02/09/2017 1250   BASOSABS 0.0 02/09/2017 1250   Iron/TIBC/Ferritin/ %Sat No results found for: IRON, TIBC, FERRITIN, IRONPCTSAT Lipid Panel     Component Value Date/Time   CHOL 216 (H) 02/09/2017 1250   TRIG 66 02/09/2017 1250   HDL 43 02/09/2017 1250   LDLCALC 160 (H) 02/09/2017 1250   Hepatic Function Panel     Component Value Date/Time   PROT 7.0 02/09/2017 1250   ALBUMIN 4.3 02/09/2017 1250   AST 17 02/09/2017 1250   ALT 24 02/09/2017 1250   ALKPHOS 85 02/09/2017 1250   BILITOT 0.4 02/09/2017 1250      Component Value Date/Time   TSH 1.230 02/09/2017 1250    ASSESSMENT AND PLAN: Vitamin D deficiency - Plan: Vitamin D, Ergocalciferol, (DRISDOL) 50000 units CAPS capsule  Other hyperlipidemia  Prediabetes  Other depression - with emotional eating - Plan: buPROPion (WELLBUTRIN SR) 150 MG 12 hr tablet  At risk for diabetes mellitus  Class 3 severe obesity with serious comorbidity and body mass index  (BMI) of 40.0 to 44.9 in adult, unspecified obesity type ([place])  PLAN:  Vitamin D Deficiency Nicole Rasmussen was informed that low vitamin D levels contributes to fatigue and are associated with obesity, breast, and colon cancer. Nicole Rasmussen agrees to start prescription Vit D @50 ,000 IU every week #4 with no refills and will follow up for routine testing of vitamin D, at least 2-3 times per year. She was informed of the risk of over-replacement of vitamin D and agrees to not increase her dose unless he discusses this with [name] first. Nicole Rasmussen agrees to follow up with our clinic in 2 weeks.  Hyperlipidemia Nicole Rasmussen was informed of the American Heart Association Guidelines emphasizing intensive lifestyle modifications as the first line treatment for hyperlipidemia. We discussed many lifestyle modifications today in depth, and Nicole Rasmussen will continue to work on decreasing saturated fats such as fatty red meat, butter and many fried foods.  She will also increase vegetables and lean protein in her diet and continue to work on exercise and weight loss efforts. Nicole Rasmussen agrees to start COQ10 OTC 300 mg q daily for 2 weeks and continue, and then restart Crestor. Nicole Rasmussen agrees to follow up with our clinic in 2 weeks.  Pre-Diabetes Nicole Rasmussen will continue to work on weight loss, diet, exercise, and decreasing simple carbohydrates in her diet to help decrease the risk of diabetes. We dicussed metformin including benefits and risks. She was informed that eating too many simple carbohydrates or too many calories at one sitting increases the likelihood of GI side effects. Yasmen declined metformin for now and a prescription was not written today. We will recheck labs in 3 months and Nicole Rasmussen agrees to follow up with our clinic in 2 weeks as directed to monitor her progress.  Diabetes risk counselling Nicole Rasmussen was given extended (30 minutes) diabetes prevention counseling today. She is 56 y.o. female and has risk factors for diabetes including obesity and  pre-diabetes. We discussed intensive lifestyle modifications  today with an emphasis on weight loss as well as increasing exercise and decreasing simple carbohydrates in her diet.  Depression with Emotional Eating Behaviors We discussed behavior modification techniques today to help Nicole Rasmussen deal with her emotional eating and depression. Nicole Rasmussen agrees to start Wellbutrin SR 150 mg q AM #30 with no refills and Nicole Rasmussen agrees to follow up with our clinic in 2 weeks.  Obesity Nicole Rasmussen is currently in the action stage of change. As such, her goal is to continue with weight loss efforts She has agreed to follow the Category 2 plan Nicole Rasmussen has been instructed to work up to a goal of 150 minutes of combined cardio and strengthening exercise per week or start walking 20 minutes 3 times per week for weight loss and overall health benefits. We discussed the following Behavioral Modification Strategies today: increasing lean protein intake, decreasing simple carbohydrates, and no skipping meals    Nicole Rasmussen has agreed to follow up with our clinic in 2 weeks. She was informed of the importance of frequent follow up visits to maximize her success with intensive lifestyle modifications for her multiple health conditions.  I, Trixie Dredge, am acting as transcriptionist for Dennard Nip, MD  I have reviewed the above documentation for accuracy and completeness, and I agree with the above. -Dennard Nip, MD      Today's visit was # 2 out of 73.  Starting weight: 281 lbs Starting date: 02/09/17 Today's weight : 275 lbs  Today's date: 02/23/2017 Total lbs lost to date: 6 (Patients must lose 7 lbs in the first 6 months to continue with counseling)   ASK: We discussed the diagnosis of obesity with Nicole Rasmussen today and Nicole Rasmussen agreed to give [name] permission to discuss obesity behavioral modification therapy today.  ASSESS: Nicole Rasmussen has the diagnosis of obesity and her BMI today is 43.06 Nicole Rasmussen is in the action  stage of change   ADVISE: Lorrane was educated on the multiple health risks of obesity as well as the benefit of weight loss to improve her health. She was advised of the need for long term treatment and the importance of lifestyle modifications.  AGREE: Multiple dietary modification options and treatment options were discussed and  Rhylie agreed to follow the Category 2 plan We discussed the following Behavioral Modification Strategies today: increasing lean protein intake, decreasing simple carbohydrates, and no skipping meals

## 2017-03-08 ENCOUNTER — Ambulatory Visit (INDEPENDENT_AMBULATORY_CARE_PROVIDER_SITE_OTHER): Payer: 59 | Admitting: Family Medicine

## 2017-03-08 VITALS — BP 128/83 | HR 70 | Temp 98.3°F | Ht 67.0 in | Wt 265.0 lb

## 2017-03-08 DIAGNOSIS — Z6841 Body Mass Index (BMI) 40.0 and over, adult: Secondary | ICD-10-CM | POA: Diagnosis not present

## 2017-03-08 DIAGNOSIS — Z9189 Other specified personal risk factors, not elsewhere classified: Secondary | ICD-10-CM

## 2017-03-08 DIAGNOSIS — F3289 Other specified depressive episodes: Secondary | ICD-10-CM

## 2017-03-08 DIAGNOSIS — E559 Vitamin D deficiency, unspecified: Secondary | ICD-10-CM | POA: Diagnosis not present

## 2017-03-08 MED ORDER — VITAMIN D (ERGOCALCIFEROL) 1.25 MG (50000 UNIT) PO CAPS: 50000.0000 [IU] | ORAL_CAPSULE | ORAL | 0 refills | Status: DC

## 2017-03-08 MED ORDER — BUPROPION HCL ER (SR) 150 MG PO TB12: 150.0000 mg | ORAL_TABLET | Freq: Every day | ORAL | 0 refills | Status: DC

## 2017-03-08 NOTE — Progress Notes (Signed)
Office: [phone]  /  Fax: [phone]   HPI:   Chief Complaint: OBESITY Nicole Rasmussen is here to discuss her progress with her obesity treatment plan. She is on the Category 2 plan and is following her eating plan approximately 98-99 % of the time. She states she is exercising 0 minutes 0 times per week. Nicole Rasmussen continues to do very well with weight loss. She states her hunger is controlled and she is satisfied with her 100 calorie snacks.  Her weight is 265 lb (120.2 kg) today and has had a weight loss of 10 pounds over a period of 2 weeks since her last visit. She has lost 16 lbs since starting treatment with [name].  Vitamin D deficiency Nicole Rasmussen has a diagnosis of vitamin D deficiency. She is stable on prescription Vit D and she denies nausea, vomiting or muscle weakness.  At risk for osteopenia and osteoporosis Nicole Rasmussen is at higher risk of osteopenia and osteoporosis due to vitamin D deficiency.   Depression with emotional eating behaviors Nicole Rasmussen struggles with emotional eating and using food for comfort to the extent that it is negatively impacting her health. She often snacks when she is not hungry. Nicole Rasmussen sometimes feels she is out of control and then feels guilty that she made poor food choices. She has been working on behavior modification techniques to help reduce her emotional eating and has been somewhat successful. Nicole Rasmussen has started Wellbutrin and she feels she is doing better with stress and emotional eating. She feels meal planning has also gotten easier. She shows no sign of suicidal or homicidal ideations.  Depression screen PHQ 2/9 02/09/2017  Decreased Interest 0  Down, Depressed, Hopeless 0  PHQ - 2 Score 0  Altered sleeping 1  Tired, decreased energy 1  Change in appetite 1  Feeling bad or failure about yourself  0  Trouble concentrating 0  Moving slowly or fidgety/restless 0  Suicidal thoughts 0  PHQ-9 Score 3  Difficult doing work/chores Not difficult at all    ALLERGIES: Allergies  Allergen Reactions  . Latex   . Shellfish Allergy Hives and Swelling  . Sulfonamide Derivatives     MEDICATIONS: Current Outpatient Medications on File Prior to Visit  Medication Sig Dispense Refill  . Coenzyme Q10 (EQL COQ10) 300 MG CAPS Take 1 capsule (300 mg total) by mouth daily at 12 noon.  0  . diclofenac (VOLTAREN) 75 MG EC tablet Take 75 mg by mouth 2 (two) times daily.    . fexofenadine (ALLEGRA) 180 MG tablet Take 180 mg by mouth daily.    Marland Kitchen gabapentin (NEURONTIN) 300 MG capsule Take 1 capsule (300 mg total) by mouth 3 (three) times daily. Can take once daily or 2 times a day, up to 3 times a day. 90 capsule 5  . ibuprofen (ADVIL,MOTRIN) 200 MG tablet Take 200 mg by mouth every 6 (six) hours as needed.    . meloxicam (MOBIC) 15 MG tablet Take 15 mg by mouth daily.    . rosuvastatin (CRESTOR) 10 MG tablet Take 10 mg by mouth daily.     No current facility-administered medications on file prior to visit.     PAST MEDICAL HISTORY: Past Medical History:  Diagnosis Date  . Acid reflux   . Allergy   . Anemia   . Arthritis   . Asthma   . B12 deficiency   . Dysmetabolic syndrome X   . Dyspnea   . Food allergy    shellfish  . GERD (gastroesophageal  reflux disease)   . HLD (hyperlipidemia)   . Joint pain   . Lower back pain   . Nerve pain    top of right thigh  . Prediabetes   . Sinus problem   . Vitamin B 12 deficiency   . Vitamin D deficiency     PAST SURGICAL HISTORY: Past Surgical History:  Procedure Laterality Date  . CESAREAN SECTION    . FOOT SURGERY Bilateral 1980's  . MOUTH SURGERY     2011    SOCIAL HISTORY: Social History   Tobacco Use  . Smoking status: Former Smoker    Packs/day: 1.00    Years: 20.00    Pack years: 20.00    Types: Cigarettes    Last attempt to quit: 05/02/1996    Years since quitting: 20.8  . Smokeless tobacco: Never Used  Substance Use Topics  . Alcohol use: Yes  . Drug use: No    FAMILY  HISTORY: Family History  Problem Relation Age of Onset  . Cancer Mother   . Hypertension Mother   . Hyperlipidemia Mother   . Heart disease Mother   . Cancer Father   . Kidney disease Father   . Hyperlipidemia Sister   . Diabetes Sister   . Hypertension Sister   . Hyperlipidemia Brother   . Hypertension Brother     ROS: Review of Systems  Constitutional: Positive for weight loss.  Gastrointestinal: Negative for nausea and vomiting.  Musculoskeletal:       Negative muscle weakness  Psychiatric/Behavioral: Positive for depression. Negative for suicidal ideas.    PHYSICAL EXAM: Blood pressure 128/83, pulse 70, temperature 98.3 F (36.8 C), temperature source Oral, height 5\' 7"  (1.702 m), weight 265 lb (120.2 kg), last menstrual period 10/11/2010, SpO2 99 %. Body mass index is 41.5 kg/m. Physical Exam  Constitutional: She is oriented to person, place, and time. She appears well-developed and well-nourished.  Cardiovascular: Normal rate.  Pulmonary/Chest: Effort normal.  Musculoskeletal: Normal range of motion.  Neurological: She is oriented to person, place, and time.  Skin: Skin is warm and dry.  Psychiatric: She has a normal mood and affect.  Vitals reviewed.   RECENT LABS AND TESTS: BMET    Component Value Date/Time   NA 139 02/09/2017 1250   K 3.7 02/09/2017 1250   CL 103 02/09/2017 1250   CO2 23 02/09/2017 1250   GLUCOSE 85 02/09/2017 1250   GLUCOSE 99 06/08/2013 2035   BUN 9 02/09/2017 1250   CREATININE 0.66 02/09/2017 1250   CALCIUM 9.4 02/09/2017 1250   GFRNONAA 98 02/09/2017 1250   GFRAA 113 02/09/2017 1250   Lab Results  Component Value Date   HGBA1C 5.9 (H) 02/09/2017   Lab Results  Component Value Date   INSULIN 9.5 02/09/2017   CBC    Component Value Date/Time   WBC 7.2 02/09/2017 1250   WBC 9.7 06/08/2013 2035   RBC 4.97 02/09/2017 1250   RBC 4.70 06/08/2013 2035   HGB 13.3 02/09/2017 1250   HCT 42.3 02/09/2017 1250   PLT  06/08/2013  2035    PLATELET CLUMPS NOTED ON SMEAR, COUNT APPEARS ADEQUATE   MCV 85 02/09/2017 1250   MCH 26.8 02/09/2017 1250   MCH 27.2 06/08/2013 2035   MCHC 31.4 (L) 02/09/2017 1250   MCHC 32.2 06/08/2013 2035   RDW 14.7 02/09/2017 1250   LYMPHSABS 2.9 02/09/2017 1250   MONOABS 0.8 06/08/2013 2035   EOSABS 0.1 02/09/2017 1250   BASOSABS 0.0 02/09/2017  1250   Iron/TIBC/Ferritin/ %Sat No results found for: IRON, TIBC, FERRITIN, IRONPCTSAT Lipid Panel     Component Value Date/Time   CHOL 216 (H) 02/09/2017 1250   TRIG 66 02/09/2017 1250   HDL 43 02/09/2017 1250   LDLCALC 160 (H) 02/09/2017 1250   Hepatic Function Panel     Component Value Date/Time   PROT 7.0 02/09/2017 1250   ALBUMIN 4.3 02/09/2017 1250   AST 17 02/09/2017 1250   ALT 24 02/09/2017 1250   ALKPHOS 85 02/09/2017 1250   BILITOT 0.4 02/09/2017 1250      Component Value Date/Time   TSH 1.230 02/09/2017 1250    ASSESSMENT AND PLAN: Vitamin D deficiency - Plan: Vitamin D, Ergocalciferol, (DRISDOL) 50000 units CAPS capsule  Other depression - with emotional eating - Plan: buPROPion (WELLBUTRIN SR) 150 MG 12 hr tablet  At risk for osteoporosis  Class 3 severe obesity with serious comorbidity and body mass index (BMI) of 40.0 to 44.9 in adult, unspecified obesity type (Nicole Rasmussen)  PLAN:  Vitamin D Deficiency Nicole Rasmussen was informed that low vitamin D levels contributes to fatigue and are associated with obesity, breast, and colon cancer. Nicole Rasmussen agrees to continue taking prescription Vit D @50 ,000 IU every week #4 and we will refill for 1 month. She will follow up for routine testing of vitamin D, at least 2-3 times per year. She was informed of the risk of over-replacement of vitamin D and agrees to not increase her dose unless he discusses this with [name] first. We will recheck labs and Nicole Rasmussen agrees to follow up with our clinic in 3 weeks with myself and follow up in 5 weeks with our dietitian.  At risk for osteopenia and  osteoporosis Nicole Rasmussen is at risk for osteopenia and osteoporsis due to her vitamin D deficiency. She was encouraged to take her vitamin D and follow her higher calcium diet and increase strengthening exercise to help strengthen her bones and decrease her risk of osteopenia and osteoporosis.  Depression with Emotional Eating Behaviors We discussed behavior modification techniques today to help Nicole Rasmussen deal with her emotional eating and depression. Nicole Rasmussen agrees to continue taking Wellbutrin SR 150 mg qd and we will refill for 1 month. Nicole Rasmussen agrees to follow up with our clinic in 3 weeks with myself and follow up in 5 weeks with our dietitian.  Obesity Nicole Rasmussen is currently in the action stage of change. As such, her goal is to continue with weight loss efforts She has agreed to follow the Category 2 plan Maiya has been instructed to work up to a goal of 150 minutes of combined cardio and strengthening exercise per week for weight loss and overall health benefits. We discussed the following Behavioral Modification Strategies today: increasing lean protein intake, work on meal planning and easy cooking plans, holiday eating strategies, and keeping healthy foods in the home.    Nicole Rasmussen has agreed to follow up with our clinic in 3 weeks with myself and follow up in 5 weeks with our dietitian. She was informed of the importance of frequent follow up visits to maximize her success with intensive lifestyle modifications for her multiple health conditions.  I, Nicole Rasmussen Dredge, am acting as transcriptionist for Dennard Nip, MD  I have reviewed the above documentation for accuracy and completeness, and I agree with the above. -Dennard Nip, MD      Today's visit was # 3 out of 22.  Starting weight: 281 lbs Starting date: 02/09/17 Today's weight : 265 lbs  Today's date: 03/08/2017 Total lbs lost to date: 3 (Patients must lose 7 lbs in the first 6 months to continue with counseling)   ASK: We discussed  the diagnosis of obesity with Nicole Rasmussen today and Nicole Rasmussen agreed to give [name] permission to discuss obesity behavioral modification therapy today.  ASSESS: Nicole Rasmussen has the diagnosis of obesity and her BMI today is 41.5 Nicole Rasmussen is in the action stage of change   ADVISE: Nicole Rasmussen was educated on the multiple health risks of obesity as well as the benefit of weight loss to improve her health. She was advised of the need for long term treatment and the importance of lifestyle modifications.  AGREE: Multiple dietary modification options and treatment options were discussed and  Nicole Rasmussen agreed to follow the Category 2 plan We discussed the following Behavioral Modification Strategies today: increasing lean protein intake, work on meal planning and easy cooking plans, holiday eating strategies, and keeping healthy foods in the home.

## 2017-03-29 ENCOUNTER — Ambulatory Visit (INDEPENDENT_AMBULATORY_CARE_PROVIDER_SITE_OTHER): Payer: 59 | Admitting: Family Medicine

## 2017-03-29 VITALS — BP 129/83 | HR 68 | Temp 98.3°F | Ht 67.0 in | Wt 257.0 lb

## 2017-03-29 DIAGNOSIS — Z9189 Other specified personal risk factors, not elsewhere classified: Secondary | ICD-10-CM

## 2017-03-29 DIAGNOSIS — E559 Vitamin D deficiency, unspecified: Secondary | ICD-10-CM | POA: Diagnosis not present

## 2017-03-29 DIAGNOSIS — F3289 Other specified depressive episodes: Secondary | ICD-10-CM

## 2017-03-29 DIAGNOSIS — Z6841 Body Mass Index (BMI) 40.0 and over, adult: Secondary | ICD-10-CM

## 2017-03-29 MED ORDER — VITAMIN D (ERGOCALCIFEROL) 1.25 MG (50000 UNIT) PO CAPS: 50000.0000 [IU] | ORAL_CAPSULE | ORAL | 0 refills | Status: DC

## 2017-03-29 NOTE — Progress Notes (Signed)
Office: [phone]  /  Fax: [phone]   HPI:   Chief Complaint: OBESITY Nicole Rasmussen is here to discuss her progress with her obesity treatment plan. She is on the Category 2 plan and is following her eating plan approximately 85 % of the time. She states she is exercising 0 minutes 0 times per week. Nicole Rasmussen continues to do well with weight loss, even over Thanksgiving. She is doing better with controlling her portions and decreasing breads. Her weight is 257 lb (116.6 kg) today and has had a weight loss of 8 pounds over a period of 3 weeks since her last visit. She has lost 24 lbs since starting treatment with [name].  Vitamin D deficiency Nicole Rasmussen has a diagnosis of vitamin D deficiency. She is currently stable on vit D but is not yet at goal. Nicole Rasmussen denies nausea, vomiting or muscle weakness.  At risk for osteopenia and osteoporosis Nicole Rasmussen is at higher risk of osteopenia and osteoporosis due to vitamin D deficiency.   Depression with emotional eating behaviors Nicole Rasmussen is doing well on wellbutrin. Her blood pressure is stable and she denies insomnia. Nicole Rasmussen feels more in control of her emotional eating. She struggles with emotional eating and using food for comfort to the extent that it is negatively impacting her health. She often snacks when she is not hungry. Nicole Rasmussen sometimes feels she is out of control and then feels guilty that she made poor food choices. She has been working on behavior modification techniques to help reduce her emotional eating and has been somewhat successful. She shows no sign of suicidal or homicidal ideations.  Depression screen PHQ 2/9 02/09/2017  Decreased Interest 0  Down, Depressed, Hopeless 0  PHQ - 2 Score 0  Altered sleeping 1  Tired, decreased energy 1  Change in appetite 1  Feeling bad or failure about yourself  0  Trouble concentrating 0  Moving slowly or fidgety/restless 0  Suicidal thoughts 0  PHQ-9 Score 3  Difficult doing work/chores Not difficult at all        ALLERGIES: Allergies  Allergen Reactions   Latex    Shellfish Allergy Hives and Swelling   Sulfonamide Derivatives     MEDICATIONS: Current Outpatient Medications on File Prior to Visit  Medication Sig Dispense Refill   buPROPion (WELLBUTRIN SR) 150 MG 12 hr tablet Take 1 tablet (150 mg total) daily by mouth. 30 tablet 0   Coenzyme Q10 (EQL COQ10) 300 MG CAPS Take 1 capsule (300 mg total) by mouth daily at 12 noon.  0   diclofenac (VOLTAREN) 75 MG EC tablet Take 75 mg by mouth 2 (two) times daily.     fexofenadine (ALLEGRA) 180 MG tablet Take 180 mg by mouth daily.     gabapentin (NEURONTIN) 300 MG capsule Take 1 capsule (300 mg total) by mouth 3 (three) times daily. Can take once daily or 2 times a day, up to 3 times a day. 90 capsule 5   ibuprofen (ADVIL,MOTRIN) 200 MG tablet Take 200 mg by mouth every 6 (six) hours as needed.     meloxicam (MOBIC) 15 MG tablet Take 15 mg by mouth daily.     rosuvastatin (CRESTOR) 10 MG tablet Take 10 mg by mouth daily.     No current facility-administered medications on file prior to visit.     PAST MEDICAL HISTORY: Past Medical History:  Diagnosis Date   Acid reflux    Allergy    Anemia    Arthritis    Asthma  B12 deficiency    Dysmetabolic syndrome X    Dyspnea    Food allergy    shellfish   GERD (gastroesophageal reflux disease)    HLD (hyperlipidemia)    Joint pain    Lower back pain    Nerve pain    top of right thigh   Prediabetes    Sinus problem    Vitamin B 12 deficiency    Vitamin D deficiency     PAST SURGICAL HISTORY: Past Surgical History:  Procedure Laterality Date   CESAREAN SECTION     FOOT SURGERY Bilateral 1980's   MOUTH SURGERY     2011    SOCIAL HISTORY: Social History   Tobacco Use   Smoking status: Former Smoker    Packs/day: 1.00    Years: 20.00    Pack years: 20.00    Types: Cigarettes    Last attempt to quit: 05/02/1996    Years since quitting:  20.9   Smokeless tobacco: Never Used  Substance Use Topics   Alcohol use: Yes   Drug use: No    FAMILY HISTORY: Family History  Problem Relation Age of Onset   Cancer Mother    Hypertension Mother    Hyperlipidemia Mother    Heart disease Mother    Cancer Father    Kidney disease Father    Hyperlipidemia Sister    Diabetes Sister    Hypertension Sister    Hyperlipidemia Brother    Hypertension Brother     ROS: Review of Systems  Constitutional: Positive for weight loss.  Gastrointestinal: Negative for nausea and vomiting.  Musculoskeletal:       Negative muscle weakness  Psychiatric/Behavioral: Positive for depression. Negative for suicidal ideas. The patient does not have insomnia.     PHYSICAL EXAM: Blood pressure 129/83, pulse 68, temperature 98.3 F (36.8 C), temperature source Oral, height 5\' 7"  (1.702 m), weight 257 lb (116.6 kg), last menstrual period 10/11/2010, SpO2 97 %. Body mass index is 40.25 kg/m. Physical Exam  Constitutional: She is oriented to person, place, and time. She appears well-developed and well-nourished.  Cardiovascular: Normal rate.  Pulmonary/Chest: Effort normal.  Musculoskeletal: Normal range of motion.  Neurological: She is oriented to person, place, and time.  Skin: Skin is warm and dry.  Psychiatric: She has a normal mood and affect. Her behavior is normal.  Vitals reviewed.   RECENT LABS AND TESTS: BMET    Component Value Date/Time   NA 139 02/09/2017 1250   K 3.7 02/09/2017 1250   CL 103 02/09/2017 1250   CO2 23 02/09/2017 1250   GLUCOSE 85 02/09/2017 1250   GLUCOSE 99 06/08/2013 2035   BUN 9 02/09/2017 1250   CREATININE 0.66 02/09/2017 1250   CALCIUM 9.4 02/09/2017 1250   GFRNONAA 98 02/09/2017 1250   GFRAA 113 02/09/2017 1250   Lab Results  Component Value Date   HGBA1C 5.9 (H) 02/09/2017   Lab Results  Component Value Date   INSULIN 9.5 02/09/2017   CBC    Component Value Date/Time   WBC  7.2 02/09/2017 1250   WBC 9.7 06/08/2013 2035   RBC 4.97 02/09/2017 1250   RBC 4.70 06/08/2013 2035   HGB 13.3 02/09/2017 1250   HCT 42.3 02/09/2017 1250   PLT  06/08/2013 2035    PLATELET CLUMPS NOTED ON SMEAR, COUNT APPEARS ADEQUATE   MCV 85 02/09/2017 1250   MCH 26.8 02/09/2017 1250   MCH 27.2 06/08/2013 2035   MCHC 31.4 (L) 02/09/2017  1250   MCHC 32.2 06/08/2013 2035   RDW 14.7 02/09/2017 1250   LYMPHSABS 2.9 02/09/2017 1250   MONOABS 0.8 06/08/2013 2035   EOSABS 0.1 02/09/2017 1250   BASOSABS 0.0 02/09/2017 1250   Iron/TIBC/Ferritin/ %Sat No results found for: IRON, TIBC, FERRITIN, IRONPCTSAT Lipid Panel     Component Value Date/Time   CHOL 216 (H) 02/09/2017 1250   TRIG 66 02/09/2017 1250   HDL 43 02/09/2017 1250   LDLCALC 160 (H) 02/09/2017 1250   Hepatic Function Panel     Component Value Date/Time   PROT 7.0 02/09/2017 1250   ALBUMIN 4.3 02/09/2017 1250   AST 17 02/09/2017 1250   ALT 24 02/09/2017 1250   ALKPHOS 85 02/09/2017 1250   BILITOT 0.4 02/09/2017 1250      Component Value Date/Time   TSH 1.230 02/09/2017 1250    ASSESSMENT AND PLAN: Vitamin D deficiency - Plan: Vitamin D, Ergocalciferol, (DRISDOL) 50000 units CAPS capsule  Other depression - with emotional eating  At risk for osteoporosis  Class 3 severe obesity with serious comorbidity and body mass index (BMI) of 40.0 to 44.9 in adult, unspecified obesity type ([place])  PLAN:  Vitamin D Deficiency Nicole Rasmussen was informed that low vitamin D levels contributes to fatigue and are associated with obesity, breast, and colon cancer. She agrees to continue to take prescription Vit D @50 ,000 IU every week #4 with no refills and will follow up for routine testing of vitamin D, at least 2-3 times per year. She was informed of the risk of over-replacement of vitamin D and agrees to not increase her dose unless he discusses this with [name] first. Nicole Rasmussen agrees to follow up with our clinic in 4 weeks.  At risk for  osteopenia and osteoporosis Nicole Rasmussen is at risk for osteopenia and osteoporosis due to her vitamin D deficiency. She was encouraged to take her vitamin D and follow her higher calcium diet and increase strengthening exercise to help strengthen her bones and decrease her risk of osteopenia and osteoporosis.  Depression with Emotional Eating Behaviors We discussed behavior modification techniques today to help Nicole Rasmussen deal with her emotional eating and depression. She has agreed to continue Wellbutrin SR 150 mg qd and will follow up as directed.  Obesity Nicole Rasmussen is currently in the action stage of change. As such, her goal is to continue with weight loss efforts She has agreed to follow the Category 2 plan Nicole Rasmussen has been instructed to work up to a goal of 150 minutes of combined cardio and strengthening exercise per week for weight loss and overall health benefits. We discussed the following Behavioral Modification Strategies today: dealing with family or coworker sabotage, holiday eating strategies, travel eating strategies and emotional eating strategies  Nicole Rasmussen has agreed to follow up with our clinic in 4 weeks. She was informed of the importance of frequent follow up visits to maximize her success with intensive lifestyle modifications for her multiple health conditions.  I, Doreene Nest, am acting as transcriptionist for Dennard Nip, MD  I have reviewed the above documentation for accuracy and completeness, and I agree with the above. -Dennard Nip, MD    OBESITY BEHAVIORAL INTERVENTION VISIT  Today's visit was # 4 out of 22.  Starting weight: 281 lbs Starting date: 02/09/17 Today's weight : 257 lbs Today's date: 03/29/2017 Total lbs lost to date: 24 (Patients must lose 7 lbs in the first 6 months to continue with counseling)   ASK: We discussed the diagnosis of obesity with  Nicole Rasmussen today and Nicole Rasmussen agreed to give [name] permission to discuss obesity behavioral modification  therapy today.  ASSESS: Nicole Rasmussen has the diagnosis of obesity and her BMI today is 40.24 Nicole Rasmussen is in the action stage of change   ADVISE: Nicole Rasmussen was educated on the multiple health risks of obesity as well as the benefit of weight loss to improve her health. She was advised of the need for long term treatment and the importance of lifestyle modifications.  AGREE: Multiple dietary modification options and treatment options were discussed and  Nicole Rasmussen agreed to follow the Category 2 plan We discussed the following Behavioral Modification Strategies today: dealing with family or coworker sabotage, holiday eating strategies, travel eating strategies and emotional eating strategies

## 2017-04-07 ENCOUNTER — Encounter (INDEPENDENT_AMBULATORY_CARE_PROVIDER_SITE_OTHER): Payer: Self-pay | Admitting: Family Medicine

## 2017-04-12 ENCOUNTER — Ambulatory Visit (INDEPENDENT_AMBULATORY_CARE_PROVIDER_SITE_OTHER): Payer: 59 | Admitting: Dietician

## 2017-04-26 ENCOUNTER — Ambulatory Visit (INDEPENDENT_AMBULATORY_CARE_PROVIDER_SITE_OTHER): Payer: 59 | Admitting: Physician Assistant

## 2017-04-26 VITALS — BP 111/76 | HR 80 | Temp 98.5°F | Ht 67.0 in | Wt 259.0 lb

## 2017-04-26 DIAGNOSIS — R7303 Prediabetes: Secondary | ICD-10-CM | POA: Diagnosis not present

## 2017-04-26 DIAGNOSIS — F3289 Other specified depressive episodes: Secondary | ICD-10-CM | POA: Diagnosis not present

## 2017-04-26 DIAGNOSIS — Z6841 Body Mass Index (BMI) 40.0 and over, adult: Secondary | ICD-10-CM

## 2017-04-26 MED ORDER — BUPROPION HCL ER (SR) 200 MG PO TB12: 200.0000 mg | ORAL_TABLET | Freq: Every day | ORAL | 0 refills | Status: DC

## 2017-04-26 NOTE — Progress Notes (Signed)
Office: [phone]  /  Fax: [phone]   HPI:   Chief Complaint: OBESITY Nicole Rasmussen is here to discuss her progress with her obesity treatment plan. She is on the Category 2 plan and is following her eating plan approximately 55 % of the time. She states she is exercising 0 minutes 0 times per week. Nicole Rasmussen had increase in cravings and has deviated with her eating with the holidays. She would like a different meal plan.  Her weight is 259 lb (117.5 kg) today and has gained 2 pounds since her last visit. She has lost 22 lbs since starting treatment with [name].  Pre-Diabetes Nicole Rasmussen has a diagnosis of pre-diabetes based on her elevated Hgb A1c and was informed this puts her at greater risk of developing diabetes. She has noticed some hunger but she declines metformin today and she continues to work on diet and exercise to decrease risk of diabetes. She denies nausea or hypoglycemia.  Depression with emotional eating behaviors Nicole Rasmussen has noticed increase in cravings and she is struggling with emotional eating and using food for comfort to the extent that it is negatively impacting her health. She often snacks when she is not hungry. Nicole Rasmussen sometimes feels she is out of control and then feels guilty that she made poor food choices. She has been working on behavior modification techniques to help reduce her emotional eating and has been somewhat successful. She shows no sign of suicidal or homicidal ideations.  Depression screen PHQ 2/9 02/09/2017  Decreased Interest 0  Down, Depressed, Hopeless 0  PHQ - 2 Score 0  Altered sleeping 1  Tired, decreased energy 1  Change in appetite 1  Feeling bad or failure about yourself  0  Trouble concentrating 0  Moving slowly or fidgety/restless 0  Suicidal thoughts 0  PHQ-9 Score 3  Difficult doing work/chores Not difficult at all   ALLERGIES: Allergies  Allergen Reactions  . Latex   . Shellfish Allergy Hives and Swelling  . Sulfonamide Derivatives      MEDICATIONS: Current Outpatient Medications on File Prior to Visit  Medication Sig Dispense Refill  . Coenzyme Q10 (EQL COQ10) 300 MG CAPS Take 1 capsule (300 mg total) by mouth daily at 12 noon.  0  . diclofenac (VOLTAREN) 75 MG EC tablet Take 75 mg by mouth 2 (two) times daily.    . fexofenadine (ALLEGRA) 180 MG tablet Take 180 mg by mouth daily.    Marland Kitchen gabapentin (NEURONTIN) 300 MG capsule Take 1 capsule (300 mg total) by mouth 3 (three) times daily. Can take once daily or 2 times a day, up to 3 times a day. 90 capsule 5  . ibuprofen (ADVIL,MOTRIN) 200 MG tablet Take 200 mg by mouth every 6 (six) hours as needed.    . meloxicam (MOBIC) 15 MG tablet Take 15 mg by mouth daily.    . rosuvastatin (CRESTOR) 10 MG tablet Take 10 mg by mouth daily.    . Vitamin D, Ergocalciferol, (DRISDOL) 50000 units CAPS capsule Take 1 capsule (50,000 Units total) by mouth every 7 (seven) days. 4 capsule 0   No current facility-administered medications on file prior to visit.     PAST MEDICAL HISTORY: Past Medical History:  Diagnosis Date  . Acid reflux   . Allergy   . Anemia   . Arthritis   . Asthma   . B12 deficiency   . Dysmetabolic syndrome X   . Dyspnea   . Food allergy    shellfish  .  GERD (gastroesophageal reflux disease)   . HLD (hyperlipidemia)   . Joint pain   . Lower back pain   . Nerve pain    top of right thigh  . Prediabetes   . Sinus problem   . Vitamin B 12 deficiency   . Vitamin D deficiency     PAST SURGICAL HISTORY: Past Surgical History:  Procedure Laterality Date  . CESAREAN SECTION    . FOOT SURGERY Bilateral 1980's  . MOUTH SURGERY     2011    SOCIAL HISTORY: Social History   Tobacco Use  . Smoking status: Former Smoker    Packs/day: 1.00    Years: 20.00    Pack years: 20.00    Types: Cigarettes    Last attempt to quit: 05/02/1996    Years since quitting: 20.9  . Smokeless tobacco: Never Used  Substance Use Topics  . Alcohol use: Yes  . Drug use:  No    FAMILY HISTORY: Family History  Problem Relation Age of Onset  . Cancer Mother   . Hypertension Mother   . Hyperlipidemia Mother   . Heart disease Mother   . Cancer Father   . Kidney disease Father   . Hyperlipidemia Sister   . Diabetes Sister   . Hypertension Sister   . Hyperlipidemia Brother   . Hypertension Brother     ROS: Review of Systems  Constitutional: Negative for weight loss.  Gastrointestinal: Negative for nausea.  Endo/Heme/Allergies:       Negative hypoglycemia   Psychiatric/Behavioral: Positive for depression. Negative for suicidal ideas.    PHYSICAL EXAM: Blood pressure 111/76, pulse 80, temperature 98.5 F (36.9 C), temperature source Oral, height 5\' 7"  (1.702 m), weight 259 lb (117.5 kg), last menstrual period 10/11/2010, SpO2 96 %. Body mass index is 40.57 kg/m. Physical Exam  Constitutional: She is oriented to person, place, and time. She appears well-developed and well-nourished.  Cardiovascular: Normal rate.  Pulmonary/Chest: Effort normal.  Musculoskeletal: Normal range of motion.  Neurological: She is oriented to person, place, and time.  Skin: Skin is warm and dry.  Psychiatric: She has a normal mood and affect.  Vitals reviewed.   RECENT LABS AND TESTS: BMET    Component Value Date/Time   NA 139 02/09/2017 1250   K 3.7 02/09/2017 1250   CL 103 02/09/2017 1250   CO2 23 02/09/2017 1250   GLUCOSE 85 02/09/2017 1250   GLUCOSE 99 06/08/2013 2035   BUN 9 02/09/2017 1250   CREATININE 0.66 02/09/2017 1250   CALCIUM 9.4 02/09/2017 1250   GFRNONAA 98 02/09/2017 1250   GFRAA 113 02/09/2017 1250   Lab Results  Component Value Date   HGBA1C 5.9 (H) 02/09/2017   Lab Results  Component Value Date   INSULIN 9.5 02/09/2017   CBC    Component Value Date/Time   WBC 7.2 02/09/2017 1250   WBC 9.7 06/08/2013 2035   RBC 4.97 02/09/2017 1250   RBC 4.70 06/08/2013 2035   HGB 13.3 02/09/2017 1250   HCT 42.3 02/09/2017 1250   PLT   06/08/2013 2035    PLATELET CLUMPS NOTED ON SMEAR, COUNT APPEARS ADEQUATE   MCV 85 02/09/2017 1250   MCH 26.8 02/09/2017 1250   MCH 27.2 06/08/2013 2035   MCHC 31.4 (L) 02/09/2017 1250   MCHC 32.2 06/08/2013 2035   RDW 14.7 02/09/2017 1250   LYMPHSABS 2.9 02/09/2017 1250   MONOABS 0.8 06/08/2013 2035   EOSABS 0.1 02/09/2017 1250   BASOSABS 0.0 02/09/2017  1250   Iron/TIBC/Ferritin/ %Sat No results found for: IRON, TIBC, FERRITIN, IRONPCTSAT Lipid Panel     Component Value Date/Time   CHOL 216 (H) 02/09/2017 1250   TRIG 66 02/09/2017 1250   HDL 43 02/09/2017 1250   LDLCALC 160 (H) 02/09/2017 1250   Hepatic Function Panel     Component Value Date/Time   PROT 7.0 02/09/2017 1250   ALBUMIN 4.3 02/09/2017 1250   AST 17 02/09/2017 1250   ALT 24 02/09/2017 1250   ALKPHOS 85 02/09/2017 1250   BILITOT 0.4 02/09/2017 1250      Component Value Date/Time   TSH 1.230 02/09/2017 1250    ASSESSMENT AND PLAN: Prediabetes  Other depression - with emotional eating - Plan: buPROPion (WELLBUTRIN SR) 200 MG 12 hr tablet  Class 3 severe obesity with serious comorbidity and body mass index (BMI) of 40.0 to 44.9 in adult, unspecified obesity type (Nicole Rasmussen)  PLAN:  Pre-Diabetes Nicole Rasmussen will continue to work on weight loss, diet, exercise, and decreasing simple carbohydrates in her diet to help decrease the risk of diabetes. We dicussed metformin including benefits and risks. She was informed that eating too many simple carbohydrates or too many calories at one sitting increases the likelihood of GI side effects. Nicole Rasmussen declined metformin for now and a prescription was not written today. Nicole Rasmussen agrees to follow up with with our clinic in 2 weeks as directed to monitor her progress.  Depression with Emotional Eating Behaviors We discussed behavior modification techniques today to help Nicole Rasmussen deal with her emotional eating and depression. Nicole Rasmussen agrees to increase Wellbutrin SR to 200 mg qd #30 with no  refills. Nicole Rasmussen agrees to follow up with our clinic in 2 weeks.  We spent > than 50% of the 15 minute visit on the counseling as documented in the note.  Obesity Nicole Rasmussen is currently in the action stage of change. As such, her goal is to continue with weight loss efforts She has agreed to change to follow a lower carbohydrate, vegetable and lean protein rich diet plan Nicole Rasmussen has been instructed to work up to a goal of 150 minutes of combined cardio and strengthening exercise per week for weight loss and overall health benefits. We discussed the following Behavioral Modification Strategies today: increasing lean protein intake and planning for success   Nicole Rasmussen has agreed to follow up with our clinic in 2 weeks. She was informed of the importance of frequent follow up visits to maximize her success with intensive lifestyle modifications for her multiple health conditions.  I, Trixie Dredge, am acting as transcriptionist for Lacy Duverney, PA-C  I have reviewed the above documentation for accuracy and completeness, and I agree with the above. -Lacy Duverney, PA-C  I have reviewed the above note and agree with the plan. -Dennard Nip, MD     Today's visit was # 5 out of 22.  Starting weight: 281 lbs Starting date: 02/09/17 Today's weight : 259 lbs  Today's date: 04/26/2017 Total lbs lost to date: 22 (Patients must lose 7 lbs in the first 6 months to continue with counseling)   ASK: We discussed the diagnosis of obesity with Nicole Rasmussen today and Nicole Rasmussen agreed to give [name] permission to discuss obesity behavioral modification therapy today.  ASSESS: Nicole Rasmussen has the diagnosis of obesity and her BMI today is 40.56 Nicole Rasmussen is in the action stage of change   ADVISE: Nicole Rasmussen was educated on the multiple health risks of obesity as well as the benefit of weight loss to  improve her health. She was advised of the need for long term treatment and the importance of lifestyle  modifications.  AGREE: Multiple dietary modification options and treatment options were discussed and  Nicole Rasmussen agreed to follow a lower carbohydrate, vegetable and lean protein rich diet plan We discussed the following Behavioral Modification Strategies today: increasing lean protein intake and planning for success

## 2017-05-11 ENCOUNTER — Ambulatory Visit (INDEPENDENT_AMBULATORY_CARE_PROVIDER_SITE_OTHER): Payer: 59 | Admitting: Physician Assistant

## 2017-05-11 VITALS — BP 127/85 | HR 83 | Temp 98.3°F | Ht 67.0 in | Wt 256.0 lb

## 2017-05-11 DIAGNOSIS — F3289 Other specified depressive episodes: Secondary | ICD-10-CM | POA: Diagnosis not present

## 2017-05-11 DIAGNOSIS — Z6841 Body Mass Index (BMI) 40.0 and over, adult: Secondary | ICD-10-CM | POA: Diagnosis not present

## 2017-05-11 DIAGNOSIS — E559 Vitamin D deficiency, unspecified: Secondary | ICD-10-CM | POA: Diagnosis not present

## 2017-05-11 DIAGNOSIS — Z9189 Other specified personal risk factors, not elsewhere classified: Secondary | ICD-10-CM

## 2017-05-11 MED ORDER — VITAMIN D (ERGOCALCIFEROL) 1.25 MG (50000 UNIT) PO CAPS: 50000.0000 [IU] | ORAL_CAPSULE | ORAL | 0 refills | Status: DC

## 2017-05-11 NOTE — Progress Notes (Addendum)
Office: [phone]  /  Fax: [phone]   HPI:   Chief Complaint: OBESITY Nicole Rasmussen is here to discuss her progress with her obesity treatment plan. She is on the lower carbohydrate, vegetable and lean protein rich diet plan and is following her eating plan approximately 50 % of the time. She states she is exercising 0 minutes 0 times per week. Nicole Rasmussen continues to do well with weight loss. She states her hunger is well controlled. She would like more variety with her dinner meals.  Her weight is 256 lb (116.1 kg) today and has had a weight loss of 3 pounds over a period of 2 weeks since her last visit. She has lost 25 lbs since starting treatment with [name].  Vitamin D deficiency Nicole Rasmussen has a diagnosis of vitamin D deficiency. She is currently taking prescription Vit D and denies nausea, vomiting or muscle weakness.  At risk for osteopenia and osteoporosis Nicole Rasmussen is at higher risk of osteopenia and osteoporosis due to vitamin D deficiency.   Depression with emotional eating behaviors Nicole Rasmussen is struggling with emotional eating and using food for comfort to the extent that it is negatively impacting her health. She often snacks when she is not hungry. Nicole Rasmussen sometimes feels she is out of control and then feels guilty that she made poor food choices. She has been working on behavior modification techniques to help reduce her emotional eating and has been somewhat successful. Her mood is stable and she shows no sign of suicidal or homicidal ideations.  Depression screen PHQ 2/9 02/09/2017  Decreased Interest 0  Down, Depressed, Hopeless 0  PHQ - 2 Score 0  Altered sleeping 1  Tired, decreased energy 1  Change in appetite 1  Feeling bad or failure about yourself  0  Trouble concentrating 0  Moving slowly or fidgety/restless 0  Suicidal thoughts 0  PHQ-9 Score 3  Difficult doing work/chores Not difficult at all   ALLERGIES: Allergies  Allergen Reactions  . Latex   . Shellfish Allergy Hives  and Swelling  . Sulfonamide Derivatives     MEDICATIONS: Current Outpatient Medications on File Prior to Visit  Medication Sig Dispense Refill  . buPROPion (WELLBUTRIN SR) 200 MG 12 hr tablet Take 1 tablet (200 mg total) by mouth daily at 12 noon. 30 tablet 0  . Coenzyme Q10 (EQL COQ10) 300 MG CAPS Take 1 capsule (300 mg total) by mouth daily at 12 noon.  0  . diclofenac (VOLTAREN) 75 MG EC tablet Take 75 mg by mouth 2 (two) times daily.    . fexofenadine (ALLEGRA) 180 MG tablet Take 180 mg by mouth daily.    Marland Kitchen gabapentin (NEURONTIN) 300 MG capsule Take 1 capsule (300 mg total) by mouth 3 (three) times daily. Can take once daily or 2 times a day, up to 3 times a day. 90 capsule 5  . ibuprofen (ADVIL,MOTRIN) 200 MG tablet Take 200 mg by mouth every 6 (six) hours as needed.    . meloxicam (MOBIC) 15 MG tablet Take 15 mg by mouth daily.    . rosuvastatin (CRESTOR) 10 MG tablet Take 10 mg by mouth daily.    . Vitamin D, Ergocalciferol, (DRISDOL) 50000 units CAPS capsule Take 1 capsule (50,000 Units total) by mouth every 7 (seven) days. 4 capsule 0   No current facility-administered medications on file prior to visit.     PAST MEDICAL HISTORY: Past Medical History:  Diagnosis Date  . Acid reflux   . Allergy   .  Anemia   . Arthritis   . Asthma   . B12 deficiency   . Dysmetabolic syndrome X   . Dyspnea   . Food allergy    shellfish  . GERD (gastroesophageal reflux disease)   . HLD (hyperlipidemia)   . Joint pain   . Lower back pain   . Nerve pain    top of right thigh  . Prediabetes   . Sinus problem   . Vitamin B 12 deficiency   . Vitamin D deficiency     PAST SURGICAL HISTORY: Past Surgical History:  Procedure Laterality Date  . CESAREAN SECTION    . FOOT SURGERY Bilateral 1980's  . MOUTH SURGERY     2011    SOCIAL HISTORY: Social History   Tobacco Use  . Smoking status: Former Smoker    Packs/day: 1.00    Years: 20.00    Pack years: 20.00    Types: Cigarettes     Last attempt to quit: 05/02/1996    Years since quitting: 21.0  . Smokeless tobacco: Never Used  Substance Use Topics  . Alcohol use: Yes  . Drug use: No    FAMILY HISTORY: Family History  Problem Relation Age of Onset  . Cancer Mother   . Hypertension Mother   . Hyperlipidemia Mother   . Heart disease Mother   . Cancer Father   . Kidney disease Father   . Hyperlipidemia Sister   . Diabetes Sister   . Hypertension Sister   . Hyperlipidemia Brother   . Hypertension Brother     ROS: Review of Systems  Constitutional: Positive for weight loss.  Gastrointestinal: Negative for nausea and vomiting.  Musculoskeletal:       Negative muscle weakness  Psychiatric/Behavioral: Positive for depression. Negative for suicidal ideas.    PHYSICAL EXAM: Blood pressure 127/85, pulse 83, temperature 98.3 F (36.8 C), temperature source Oral, height 5\' 7"  (1.702 m), weight 256 lb (116.1 kg), last menstrual period 10/11/2010, SpO2 98 %. Body mass index is 40.1 kg/m. Physical Exam  Constitutional: She is oriented to person, place, and time. She appears well-developed and well-nourished.  Cardiovascular: Normal rate.  Pulmonary/Chest: Effort normal.  Musculoskeletal: Normal range of motion.  Neurological: She is oriented to person, place, and time.  Skin: Skin is warm and dry.  Psychiatric: She has a normal mood and affect.  Vitals reviewed.   RECENT LABS AND TESTS: BMET    Component Value Date/Time   NA 139 02/09/2017 1250   K 3.7 02/09/2017 1250   CL 103 02/09/2017 1250   CO2 23 02/09/2017 1250   GLUCOSE 85 02/09/2017 1250   GLUCOSE 99 06/08/2013 2035   BUN 9 02/09/2017 1250   CREATININE 0.66 02/09/2017 1250   CALCIUM 9.4 02/09/2017 1250   GFRNONAA 98 02/09/2017 1250   GFRAA 113 02/09/2017 1250   Lab Results  Component Value Date   HGBA1C 5.9 (H) 02/09/2017   Lab Results  Component Value Date   INSULIN 9.5 02/09/2017   CBC    Component Value Date/Time   WBC 7.2  02/09/2017 1250   WBC 9.7 06/08/2013 2035   RBC 4.97 02/09/2017 1250   RBC 4.70 06/08/2013 2035   HGB 13.3 02/09/2017 1250   HCT 42.3 02/09/2017 1250   PLT  06/08/2013 2035    PLATELET CLUMPS NOTED ON SMEAR, COUNT APPEARS ADEQUATE   MCV 85 02/09/2017 1250   MCH 26.8 02/09/2017 1250   MCH 27.2 06/08/2013 2035   MCHC 31.4 (L)  02/09/2017 1250   MCHC 32.2 06/08/2013 2035   RDW 14.7 02/09/2017 1250   LYMPHSABS 2.9 02/09/2017 1250   MONOABS 0.8 06/08/2013 2035   EOSABS 0.1 02/09/2017 1250   BASOSABS 0.0 02/09/2017 1250   Iron/TIBC/Ferritin/ %Sat No results found for: IRON, TIBC, FERRITIN, IRONPCTSAT Lipid Panel     Component Value Date/Time   CHOL 216 (H) 02/09/2017 1250   TRIG 66 02/09/2017 1250   HDL 43 02/09/2017 1250   LDLCALC 160 (H) 02/09/2017 1250   Hepatic Function Panel     Component Value Date/Time   PROT 7.0 02/09/2017 1250   ALBUMIN 4.3 02/09/2017 1250   AST 17 02/09/2017 1250   ALT 24 02/09/2017 1250   ALKPHOS 85 02/09/2017 1250   BILITOT 0.4 02/09/2017 1250      Component Value Date/Time   TSH 1.230 02/09/2017 1250  Results for MANUELITA, MOXON (MRN [id number]) as of 05/11/2017 17:14  Ref. Range 02/09/2017 12:50  Vitamin D, 25-Hydroxy Latest Ref Range: 30.0 - 100.0 ng/mL 21.4 (L)    ASSESSMENT AND PLAN: Vitamin D deficiency - Plan: Vitamin D, Ergocalciferol, (DRISDOL) 50000 units CAPS capsule  Other depression - with emotional eating  At risk for osteoporosis  Class 3 severe obesity with serious comorbidity and body mass index (BMI) of 40.0 to 44.9 in adult, unspecified obesity type ([place])  PLAN:  Vitamin D Deficiency Kelsea was informed that low vitamin D levels contributes to fatigue and are associated with obesity, breast, and colon cancer. Zela agrees to continue taking prescription Vit D @50 ,000 IU every week #4 and we will refill for 1 month. She will follow up for routine testing of vitamin D, at least 2-3 times per year. She was informed of  the risk of over-replacement of vitamin D and agrees to not increase her dose unless he discusses this with [name] first. Promyse agrees to follow up with our clinic in 2 weeks.  At risk for osteopenia and osteoporosis Nicole Rasmussen is at risk for osteopenia and osteoporsis due to her vitamin D deficiency. She was encouraged to take her vitamin D and follow her higher calcium diet and increase strengthening exercise to help strengthen her bones and decrease her risk of osteopenia and osteoporosis.  Depression with Emotional Eating Behaviors We discussed behavior modification techniques today to help Nicole Rasmussen deal with her emotional eating and depression. Nicole Rasmussen to continue taking Wellbutrin SR 200 mg qd and she agrees to follow up with our clinic in 2 weeks.  Obesity Nicole Rasmussen is currently in the action stage of change. As such, her goal is to continue with weight loss efforts She has agreed to change to keep a food journal with 500 calories and 40 grams of protein at supper daily and follow the Category 2 plan Nicole Rasmussen has been instructed to work up to a goal of 150 minutes of combined cardio and strengthening exercise per week for weight loss and overall health benefits. We discussed the following Behavioral Modification Strategies today: increasing lean protein intake and keep a strict food journal   Nicole Rasmussen has agreed to follow up with our clinic in 2 weeks. She was informed of the importance of frequent follow up visits to maximize her success with intensive lifestyle modifications for her multiple health conditions.   OBESITY BEHAVIORAL INTERVENTION VISIT  Today's visit was # 6 out of 22.  Starting weight: 281 lbs Starting date: 02/09/17 Today's weight : 256 lbs  Today's date: 05/11/2017 Total lbs lost to date: 25 (Patients must lose 7 lbs  in the first 6 months to continue with counseling)   ASK: We discussed the diagnosis of obesity with Nicole Rasmussen today and Nicole Rasmussen agreed to give [name] permission to  discuss obesity behavioral modification therapy today.  ASSESS: Nicole Rasmussen has the diagnosis of obesity and her BMI today is 40.09 Nicole Rasmussen is in the action stage of change   ADVISE: Nicole Rasmussen was educated on the multiple health risks of obesity as well as the benefit of weight loss to improve her health. She was advised of the need for long term treatment and the importance of lifestyle modifications.  AGREE: Multiple dietary modification options and treatment options were discussed and  Nicole Rasmussen agreed to the above obesity treatment plan.  Nicole Rasmussen, am acting as transcriptionist for Lacy Duverney, [facility] have reviewed this note and agree with its contents  I have reviewed the above documentation for accuracy and completeness, and I agree with the above. -Dennard Nip, MD

## 2017-05-17 DIAGNOSIS — Z01419 Encounter for gynecological examination (general) (routine) without abnormal findings: Secondary | ICD-10-CM | POA: Diagnosis not present

## 2017-05-17 DIAGNOSIS — Z124 Encounter for screening for malignant neoplasm of cervix: Secondary | ICD-10-CM | POA: Diagnosis not present

## 2017-05-17 DIAGNOSIS — Z1231 Encounter for screening mammogram for malignant neoplasm of breast: Secondary | ICD-10-CM | POA: Diagnosis not present

## 2017-05-25 ENCOUNTER — Ambulatory Visit (INDEPENDENT_AMBULATORY_CARE_PROVIDER_SITE_OTHER): Payer: 59 | Admitting: Physician Assistant

## 2017-07-03 ENCOUNTER — Ambulatory Visit (INDEPENDENT_AMBULATORY_CARE_PROVIDER_SITE_OTHER): Payer: 59 | Admitting: Family Medicine

## 2017-07-03 VITALS — BP 130/88 | HR 81 | Temp 98.0°F | Ht 67.0 in | Wt 264.0 lb

## 2017-07-03 DIAGNOSIS — Z9189 Other specified personal risk factors, not elsewhere classified: Secondary | ICD-10-CM

## 2017-07-03 DIAGNOSIS — F3289 Other specified depressive episodes: Secondary | ICD-10-CM

## 2017-07-03 DIAGNOSIS — Z6841 Body Mass Index (BMI) 40.0 and over, adult: Secondary | ICD-10-CM

## 2017-07-03 NOTE — Progress Notes (Signed)
Office: [phone]  /  Fax: [phone]   HPI:   Chief Complaint: OBESITY Nicole Rasmussen is here to discuss her progress with her obesity treatment plan. She is on the Category 2 plan and is following her eating plan approximately 50 % of the time. She states she is exercising 0 minutes 0 times per week. [name] last visit was approximately 6 to 7 weeks ago and she has gotten off track. [name] has had increased cravings for sweets and bread. She has decreased time to prep due to longer working hours, but she is ready to get back on track. Her weight is 264 lb (119.7 kg) today and has had a weight gain of 8 pounds over a period of 6 to 7 weeks since her last visit. She has lost 17 lbs since starting treatment with [name].  Depression with emotional eating behaviors [name] felt her medication wasn't working as well and has stopped. She notes increased emotional eating and she decreased planning her meals ahead of time. [name] struggles with emotional eating and using food for comfort to the extent that it is negatively impacting her health. She often snacks when she is not hungry. [name] sometimes feels she is out of control and then feels guilty that she made poor food choices. She has been working on behavior modification techniques to help reduce her emotional eating and has been somewhat successful. She shows no sign of suicidal or homicidal ideations.  At risk for cardiovascular disease [name] is at a higher than average risk for cardiovascular disease due to obesity. She currently denies any chest pain.  Depression screen PHQ 2/9 02/09/2017  Decreased Interest 0  Down, Depressed, Hopeless 0  PHQ - 2 Score 0  Altered sleeping 1  Tired, decreased energy 1  Change in appetite 1  Feeling bad or failure about yourself  0  Trouble concentrating 0  Moving slowly or fidgety/restless 0  Suicidal thoughts 0  PHQ-9 Score 3  Difficult doing work/chores Not difficult at all      ALLERGIES: Allergies    Allergen Reactions  . Latex   . Shellfish Allergy Hives and Swelling  . Sulfonamide Derivatives     MEDICATIONS: Current Outpatient Medications on File Prior to Visit  Medication Sig Dispense Refill  . Coenzyme Q10 (EQL COQ10) 300 MG CAPS Take 1 capsule (300 mg total) by mouth daily at 12 noon.  0  . diclofenac (VOLTAREN) 75 MG EC tablet Take 75 mg by mouth 2 (two) times daily.    . fexofenadine (ALLEGRA) 180 MG tablet Take 180 mg by mouth daily.    Marland Kitchen gabapentin (NEURONTIN) 300 MG capsule Take 1 capsule (300 mg total) by mouth 3 (three) times daily. Can take once daily or 2 times a day, up to 3 times a day. 90 capsule 5  . ibuprofen (ADVIL,MOTRIN) 200 MG tablet Take 200 mg by mouth every 6 (six) hours as needed.    . meloxicam (MOBIC) 15 MG tablet Take 15 mg by mouth daily.    . rosuvastatin (CRESTOR) 10 MG tablet Take 10 mg by mouth daily.    . Vitamin D, Ergocalciferol, (DRISDOL) 50000 units CAPS capsule Take 1 capsule (50,000 Units total) by mouth every 7 (seven) days. 4 capsule 0   No current facility-administered medications on file prior to visit.     PAST MEDICAL HISTORY: Past Medical History:  Diagnosis Date  . Acid reflux   . Allergy   . Anemia   . Arthritis   .  Asthma   . B12 deficiency   . Dysmetabolic syndrome X   . Dyspnea   . Food allergy    shellfish  . GERD (gastroesophageal reflux disease)   . HLD (hyperlipidemia)   . Joint pain   . Lower back pain   . Nerve pain    top of right thigh  . Prediabetes   . Sinus problem   . Vitamin B 12 deficiency   . Vitamin D deficiency     PAST SURGICAL HISTORY: Past Surgical History:  Procedure Laterality Date  . CESAREAN SECTION    . FOOT SURGERY Bilateral 1980's  . MOUTH SURGERY     2011    SOCIAL HISTORY: Social History   Tobacco Use  . Smoking status: Former Smoker    Packs/day: 1.00    Years: 20.00    Pack years: 20.00    Types: Cigarettes    Last attempt to quit: 05/02/1996    Years since  quitting: 21.1  . Smokeless tobacco: Never Used  Substance Use Topics  . Alcohol use: Yes  . Drug use: No    FAMILY HISTORY: Family History  Problem Relation Age of Onset  . Cancer Mother   . Hypertension Mother   . Hyperlipidemia Mother   . Heart disease Mother   . Cancer Father   . Kidney disease Father   . Hyperlipidemia Sister   . Diabetes Sister   . Hypertension Sister   . Hyperlipidemia Brother   . Hypertension Brother     ROS: Review of Systems  Constitutional: Negative for weight loss.  Cardiovascular: Negative for chest pain.  Psychiatric/Behavioral: Positive for depression. Negative for suicidal ideas.    PHYSICAL EXAM: Blood pressure 130/88, pulse 81, temperature 98 F (36.7 C), temperature source Oral, height 5\' 7"  (1.702 m), weight 264 lb (119.7 kg), last menstrual period 10/11/2010, SpO2 99 %. Body mass index is 41.35 kg/m. Physical Exam  Constitutional: She is oriented to person, place, and time. She appears well-developed and well-nourished.  Cardiovascular: Normal rate.  Pulmonary/Chest: Effort normal.  Musculoskeletal: Normal range of motion.  Neurological: She is oriented to person, place, and time.  Skin: Skin is warm and dry.  Psychiatric: She has a normal mood and affect. Her behavior is normal.  Vitals reviewed.   RECENT LABS AND TESTS: BMET    Component Value Date/Time   NA 139 02/09/2017 1250   K 3.7 02/09/2017 1250   CL 103 02/09/2017 1250   CO2 23 02/09/2017 1250   GLUCOSE 85 02/09/2017 1250   GLUCOSE 99 06/08/2013 2035   BUN 9 02/09/2017 1250   CREATININE 0.66 02/09/2017 1250   CALCIUM 9.4 02/09/2017 1250   GFRNONAA 98 02/09/2017 1250   GFRAA 113 02/09/2017 1250   Lab Results  Component Value Date   HGBA1C 5.9 (H) 02/09/2017   Lab Results  Component Value Date   INSULIN 9.5 02/09/2017   CBC    Component Value Date/Time   WBC 7.2 02/09/2017 1250   WBC 9.7 06/08/2013 2035   RBC 4.97 02/09/2017 1250   RBC 4.70  06/08/2013 2035   HGB 13.3 02/09/2017 1250   HCT 42.3 02/09/2017 1250   PLT  06/08/2013 2035    PLATELET CLUMPS NOTED ON SMEAR, COUNT APPEARS ADEQUATE   MCV 85 02/09/2017 1250   MCH 26.8 02/09/2017 1250   MCH 27.2 06/08/2013 2035   MCHC 31.4 (L) 02/09/2017 1250   MCHC 32.2 06/08/2013 2035   RDW 14.7 02/09/2017 1250  LYMPHSABS 2.9 02/09/2017 1250   MONOABS 0.8 06/08/2013 2035   EOSABS 0.1 02/09/2017 1250   BASOSABS 0.0 02/09/2017 1250   Iron/TIBC/Ferritin/ %Sat No results found for: IRON, TIBC, FERRITIN, IRONPCTSAT Lipid Panel     Component Value Date/Time   CHOL 216 (H) 02/09/2017 1250   TRIG 66 02/09/2017 1250   HDL 43 02/09/2017 1250   LDLCALC 160 (H) 02/09/2017 1250   Hepatic Function Panel     Component Value Date/Time   PROT 7.0 02/09/2017 1250   ALBUMIN 4.3 02/09/2017 1250   AST 17 02/09/2017 1250   ALT 24 02/09/2017 1250   ALKPHOS 85 02/09/2017 1250   BILITOT 0.4 02/09/2017 1250      Component Value Date/Time   TSH 1.230 02/09/2017 1250    ASSESSMENT AND PLAN: Other depression - with emotional eating - Plan: topiramate (TOPAMAX) 50 MG tablet  At risk for heart disease  Class 3 severe obesity with serious comorbidity and body mass index (BMI) of 40.0 to 44.9 in adult, unspecified obesity type ([facility])  PLAN:  Depression with Emotional Eating Behaviors We discussed behavior modification techniques today to help Trena deal with her emotional eating and depression. She has agreed to start Topiramate 50 mg qhs #30 with no refills and discontinue Wellbutrin. Pebble agreed to follow up as directed.  Cardiovascular risk counseling Rojean was given extended (15 minutes) coronary artery disease prevention counseling today. She is 57 y.o. female and has risk factors for heart disease including obesity. We discussed intensive lifestyle modifications today with an emphasis on specific weight loss instructions and strategies. Pt was also informed of the importance of  increasing exercise and decreasing saturated fats to help prevent heart disease.  Obesity Tarita is currently in the action stage of change. As such, her goal is to continue with weight loss efforts She has agreed to follow the Category 2 plan Vernee has been instructed to work up to a goal of 150 minutes of combined cardio and strengthening exercise per week for weight loss and overall health benefits. We discussed the following Behavioral Modification Strategies today: increasing lean protein intake, decreasing simple carbohydrates  and work on meal planning and easy cooking plans  Jonquil has agreed to follow up with our clinic in 2 to 3 weeks. She was informed of the importance of frequent follow up visits to maximize her success with intensive lifestyle modifications for her multiple health conditions.   OBESITY BEHAVIORAL INTERVENTION VISIT  Today's visit was # 7 out of 22.  Starting weight: 281 lbs Starting date: 02/09/17 Today's weight : 264 lbs Today's date: 07/03/2017 Total lbs lost to date: 17 (Patients must lose 7 lbs in the first 6 months to continue with counseling)   ASK: We discussed the diagnosis of obesity with Mickel Crow today and Camara agreed to give [name] permission to discuss obesity behavioral modification therapy today.  ASSESS: Joice has the diagnosis of obesity and her BMI today is 41.34 Kerria is in the action stage of change   ADVISE: Karlea was educated on the multiple health risks of obesity as well as the benefit of weight loss to improve her health. She was advised of the need for long term treatment and the importance of lifestyle modifications.  AGREE: Multiple dietary modification options and treatment options were discussed and  Merrillyn agreed to the above obesity treatment plan.  I, Doreene Nest, am acting as transcriptionist for Dennard Nip, MD  I have reviewed the above documentation for accuracy and completeness,  and I agree with the above.  -Dennard Nip, MD

## 2017-07-06 MED ORDER — TOPIRAMATE 50 MG PO TABS: 50.0000 mg | ORAL_TABLET | Freq: Every day | ORAL | 0 refills | Status: DC

## 2017-07-14 DIAGNOSIS — J3089 Other allergic rhinitis: Secondary | ICD-10-CM | POA: Diagnosis not present

## 2017-07-14 DIAGNOSIS — R062 Wheezing: Secondary | ICD-10-CM | POA: Diagnosis not present

## 2017-07-14 DIAGNOSIS — J018 Other acute sinusitis: Secondary | ICD-10-CM | POA: Diagnosis not present

## 2017-07-18 ENCOUNTER — Encounter (INDEPENDENT_AMBULATORY_CARE_PROVIDER_SITE_OTHER): Payer: Self-pay

## 2017-07-18 ENCOUNTER — Ambulatory Visit (INDEPENDENT_AMBULATORY_CARE_PROVIDER_SITE_OTHER): Payer: 59 | Admitting: Physician Assistant

## 2017-07-25 DIAGNOSIS — G8929 Other chronic pain: Secondary | ICD-10-CM | POA: Diagnosis not present

## 2017-07-25 DIAGNOSIS — Z79899 Other long term (current) drug therapy: Secondary | ICD-10-CM | POA: Diagnosis not present

## 2017-07-25 DIAGNOSIS — M5442 Lumbago with sciatica, left side: Secondary | ICD-10-CM | POA: Diagnosis not present

## 2017-07-25 DIAGNOSIS — M5441 Lumbago with sciatica, right side: Secondary | ICD-10-CM | POA: Diagnosis not present

## 2017-07-25 DIAGNOSIS — R7303 Prediabetes: Secondary | ICD-10-CM | POA: Diagnosis not present

## 2017-07-25 DIAGNOSIS — E782 Mixed hyperlipidemia: Secondary | ICD-10-CM | POA: Diagnosis not present

## 2017-08-24 ENCOUNTER — Encounter: Payer: Self-pay | Admitting: Internal Medicine

## 2017-09-11 ENCOUNTER — Ambulatory Visit (INDEPENDENT_AMBULATORY_CARE_PROVIDER_SITE_OTHER): Payer: 59 | Admitting: Physician Assistant

## 2017-09-11 VITALS — BP 121/86 | HR 80 | Temp 98.2°F | Ht 67.0 in | Wt 268.0 lb

## 2017-09-11 DIAGNOSIS — F3289 Other specified depressive episodes: Secondary | ICD-10-CM

## 2017-09-11 DIAGNOSIS — Z9189 Other specified personal risk factors, not elsewhere classified: Secondary | ICD-10-CM | POA: Diagnosis not present

## 2017-09-11 DIAGNOSIS — E559 Vitamin D deficiency, unspecified: Secondary | ICD-10-CM | POA: Diagnosis not present

## 2017-09-11 DIAGNOSIS — Z6841 Body Mass Index (BMI) 40.0 and over, adult: Secondary | ICD-10-CM | POA: Diagnosis not present

## 2017-09-11 MED ORDER — VITAMIN D (ERGOCALCIFEROL) 1.25 MG (50000 UNIT) PO CAPS: 50000.0000 [IU] | ORAL_CAPSULE | ORAL | 0 refills | Status: DC

## 2017-09-11 NOTE — Progress Notes (Signed)
Office: [phone]  /  Fax: [phone]   HPI:   Chief Complaint: OBESITY Nicole Rasmussen is here to discuss her progress with her obesity treatment plan. She is on the Category 2 plan and is following her eating plan approximately 0 % of the time. She states she is doing weights and walking 30 minutes 2 times per week. Nicole Rasmussen's last follow up was on 07/03/17. She states she has not been as mindful of her eating and snacks more. She is motivated to get back on track and continue with weight loss. Her weight is 268 lb (121.6 kg) today and has had a weight gain of 4 pounds over a period of 10 weeks since her last visit. She has lost 13 lbs since starting treatment with [name].  Vitamin D deficiency Nicole Rasmussen has a diagnosis of vitamin D deficiency. She is currently taking vit D and denies nausea, vomiting or muscle weakness.  At risk for osteopenia and osteoporosis Nicole Rasmussen is at higher risk of osteopenia and osteoporosis due to vitamin D deficiency.   Depression with emotional eating behaviors Nicole Rasmussen states she has not started Topiramate and does not want to take it. She states she still has Wellbutrin at home and will go back on it. Nicole Rasmussen struggles with emotional eating and using food for comfort to the extent that it is negatively impacting her health. She often snacks when she is not hungry. Nicole Rasmussen sometimes feels she is out of control and then feels guilty that she made poor food choices. She has been working on behavior modification techniques to help reduce her emotional eating and has been somewhat successful. She shows no sign of suicidal or homicidal ideations.  Depression screen PHQ 2/9 02/09/2017  Decreased Interest 0  Down, Depressed, Hopeless 0  PHQ - 2 Score 0  Altered sleeping 1  Tired, decreased energy 1  Change in appetite 1  Feeling bad or failure about yourself  0  Trouble concentrating 0  Moving slowly or fidgety/restless 0  Suicidal thoughts 0  PHQ-9 Score 3  Difficult doing  work/chores Not difficult at all    ALLERGIES: Allergies  Allergen Reactions  . Latex   . Shellfish Allergy Hives and Swelling  . Sulfonamide Derivatives     MEDICATIONS: Current Outpatient Medications on File Prior to Visit  Medication Sig Dispense Refill  . Coenzyme Q10 (EQL COQ10) 300 MG CAPS Take 1 capsule (300 mg total) by mouth daily at 12 noon.  0  . diclofenac (VOLTAREN) 75 MG EC tablet Take 75 mg by mouth 2 (two) times daily.    . fexofenadine (ALLEGRA) 180 MG tablet Take 180 mg by mouth daily.    Marland Kitchen gabapentin (NEURONTIN) 300 MG capsule Take 1 capsule (300 mg total) by mouth 3 (three) times daily. Can take once daily or 2 times a day, up to 3 times a day. 90 capsule 5  . ibuprofen (ADVIL,MOTRIN) 200 MG tablet Take 200 mg by mouth every 6 (six) hours as needed.    . meloxicam (MOBIC) 15 MG tablet Take 15 mg by mouth daily.    . rosuvastatin (CRESTOR) 10 MG tablet Take 10 mg by mouth daily.     No current facility-administered medications on file prior to visit.     PAST MEDICAL HISTORY: Past Medical History:  Diagnosis Date  . Acid reflux   . Allergy   . Anemia   . Arthritis   . Asthma   . B12 deficiency   . Dysmetabolic syndrome X   .  Dyspnea   . Food allergy    shellfish  . GERD (gastroesophageal reflux disease)   . HLD (hyperlipidemia)   . Joint pain   . Lower back pain   . Nerve pain    top of right thigh  . Prediabetes   . Sinus problem   . Vitamin B 12 deficiency   . Vitamin D deficiency     PAST SURGICAL HISTORY: Past Surgical History:  Procedure Laterality Date  . CESAREAN SECTION    . FOOT SURGERY Bilateral 1980's  . MOUTH SURGERY     2011    SOCIAL HISTORY: Social History   Tobacco Use  . Smoking status: Former Smoker    Packs/day: 1.00    Years: 20.00    Pack years: 20.00    Types: Cigarettes    Last attempt to quit: 05/02/1996    Years since quitting: 21.3  . Smokeless tobacco: Never Used  Substance Use Topics  . Alcohol use:  Yes  . Drug use: No    FAMILY HISTORY: Family History  Problem Relation Age of Onset  . Cancer Mother   . Hypertension Mother   . Hyperlipidemia Mother   . Heart disease Mother   . Cancer Father   . Kidney disease Father   . Hyperlipidemia Sister   . Diabetes Sister   . Hypertension Sister   . Hyperlipidemia Brother   . Hypertension Brother     ROS: Review of Systems  Constitutional: Negative for weight loss.  Gastrointestinal: Negative for nausea and vomiting.  Musculoskeletal:       Negative for muscle weakness  Psychiatric/Behavioral: Positive for depression. Negative for suicidal ideas.    PHYSICAL EXAM: Blood pressure 121/86, pulse 80, temperature 98.2 F (36.8 C), temperature source Oral, height 5\' 7"  (1.702 m), weight 268 lb (121.6 kg), last menstrual period 10/11/2010, SpO2 98 %. Body mass index is 41.97 kg/m. Physical Exam  Constitutional: She is oriented to person, place, and time. She appears well-developed and well-nourished.  Cardiovascular: Normal rate.  Pulmonary/Chest: Effort normal.  Musculoskeletal: Normal range of motion.  Neurological: She is oriented to person, place, and time.  Skin: Skin is warm and dry.  Psychiatric: She has a normal mood and affect. Her behavior is normal.  Vitals reviewed.   RECENT LABS AND TESTS: BMET    Component Value Date/Time   NA 139 02/09/2017 1250   K 3.7 02/09/2017 1250   CL 103 02/09/2017 1250   CO2 23 02/09/2017 1250   GLUCOSE 85 02/09/2017 1250   GLUCOSE 99 06/08/2013 2035   BUN 9 02/09/2017 1250   CREATININE 0.66 02/09/2017 1250   CALCIUM 9.4 02/09/2017 1250   GFRNONAA 98 02/09/2017 1250   GFRAA 113 02/09/2017 1250   Lab Results  Component Value Date   HGBA1C 5.9 (H) 02/09/2017   Lab Results  Component Value Date   INSULIN 9.5 02/09/2017   CBC    Component Value Date/Time   WBC 7.2 02/09/2017 1250   WBC 9.7 06/08/2013 2035   RBC 4.97 02/09/2017 1250   RBC 4.70 06/08/2013 2035   HGB 13.3  02/09/2017 1250   HCT 42.3 02/09/2017 1250   PLT  06/08/2013 2035    PLATELET CLUMPS NOTED ON SMEAR, COUNT APPEARS ADEQUATE   MCV 85 02/09/2017 1250   MCH 26.8 02/09/2017 1250   MCH 27.2 06/08/2013 2035   MCHC 31.4 (L) 02/09/2017 1250   MCHC 32.2 06/08/2013 2035   RDW 14.7 02/09/2017 1250   LYMPHSABS 2.9  02/09/2017 1250   MONOABS 0.8 06/08/2013 2035   EOSABS 0.1 02/09/2017 1250   BASOSABS 0.0 02/09/2017 1250   Iron/TIBC/Ferritin/ %Sat No results found for: IRON, TIBC, FERRITIN, IRONPCTSAT Lipid Panel     Component Value Date/Time   CHOL 216 (H) 02/09/2017 1250   TRIG 66 02/09/2017 1250   HDL 43 02/09/2017 1250   LDLCALC 160 (H) 02/09/2017 1250   Hepatic Function Panel     Component Value Date/Time   PROT 7.0 02/09/2017 1250   ALBUMIN 4.3 02/09/2017 1250   AST 17 02/09/2017 1250   ALT 24 02/09/2017 1250   ALKPHOS 85 02/09/2017 1250   BILITOT 0.4 02/09/2017 1250      Component Value Date/Time   TSH 1.230 02/09/2017 1250   Results for MIOSOTIS, WETSEL (MRN [id number]) as of 09/11/2017 15:57  Ref. Range 02/09/2017 12:50  Vitamin D, 25-Hydroxy Latest Ref Range: 30.0 - 100.0 ng/mL 21.4 (L)   ASSESSMENT AND PLAN: Vitamin D deficiency - Plan: Vitamin D, Ergocalciferol, (DRISDOL) 50000 units CAPS capsule  Other depression - with emotional eating  At risk for osteoporosis  Class 3 severe obesity with serious comorbidity and body mass index (BMI) of 40.0 to 44.9 in adult, unspecified obesity type ([place])  PLAN:  Vitamin D Deficiency Nicole Rasmussen was informed that low vitamin D levels contributes to fatigue and are associated with obesity, breast, and colon cancer. She agrees to continue to take prescription Vit D @50 ,000 IU every week #4 with no refills and will follow up for routine testing of vitamin D, at least 2-3 times per year. She was informed of the risk of over-replacement of vitamin D and agrees to not increase her dose unless she discusses this with [name] first. Nicole Rasmussen  agrees to follow up with our clinic in 2 weeks.  At risk for osteopenia and osteoporosis Nicole Rasmussen is at risk for osteopenia and osteoporosis due to her vitamin D deficiency. She was encouraged to take her vitamin D and follow her higher calcium diet and increase strengthening exercise to help strengthen her bones and decrease her risk of osteopenia and osteoporosis.  Depression with Emotional Eating Behaviors We discussed behavior modification techniques today to help Nicole Rasmussen deal with her emotional eating and depression. She has agreed to continue Wellbutrin SR 200 mg qd and follow up as directed.  Obesity Nicole Rasmussen is currently in the action stage of change. As such, her goal is to continue with weight loss efforts She has agreed to follow the Category 2 plan Nicole Rasmussen has been instructed to work up to a goal of 150 minutes of combined cardio and strengthening exercise per week for weight loss and overall health benefits. We discussed the following Behavioral Modification Strategies today: increasing lean protein intake and work on meal planning and easy cooking plans  Nicole Rasmussen has agreed to follow up with our clinic in 2 weeks. She was informed of the importance of frequent follow up visits to maximize her success with intensive lifestyle modifications for her multiple health conditions.   OBESITY BEHAVIORAL INTERVENTION VISIT  Today's visit was # 8 out of 22.  Starting weight: 281 lbs Starting date: 02/09/17 Today's weight : 268 lbs Today's date: 09/11/2017 Total lbs lost to date: 13 (Patients must lose 7 lbs in the first 6 months to continue with counseling)   ASK: We discussed the diagnosis of obesity with Nicole Rasmussen today and Nicole Rasmussen agreed to give [name] permission to discuss obesity behavioral modification therapy today.  ASSESS: Nicole Rasmussen has the diagnosis of obesity  and her BMI today is 41.96 Nicole Rasmussen is in the action stage of change   ADVISE: Nicole Rasmussen was educated on the multiple health  risks of obesity as well as the benefit of weight loss to improve her health. She was advised of the need for long term treatment and the importance of lifestyle modifications.  AGREE: Multiple dietary modification options and treatment options were discussed and  Nicole Rasmussen agreed to the above obesity treatment plan.   Corey Skains, am acting as transcriptionist for [employer], PA-C I, Lacy Duverney [place], have reviewed this note and agree with its content

## 2017-09-27 ENCOUNTER — Ambulatory Visit (INDEPENDENT_AMBULATORY_CARE_PROVIDER_SITE_OTHER): Payer: 59 | Admitting: Physician Assistant

## 2017-09-27 VITALS — BP 122/80 | HR 76 | Temp 98.3°F | Ht 67.0 in | Wt 271.0 lb

## 2017-09-27 DIAGNOSIS — E66813 Obesity, class 3: Secondary | ICD-10-CM

## 2017-09-27 DIAGNOSIS — Z9189 Other specified personal risk factors, not elsewhere classified: Secondary | ICD-10-CM | POA: Diagnosis not present

## 2017-09-27 DIAGNOSIS — E559 Vitamin D deficiency, unspecified: Secondary | ICD-10-CM

## 2017-09-27 DIAGNOSIS — Z6841 Body Mass Index (BMI) 40.0 and over, adult: Secondary | ICD-10-CM | POA: Diagnosis not present

## 2017-09-27 DIAGNOSIS — F3289 Other specified depressive episodes: Secondary | ICD-10-CM | POA: Diagnosis not present

## 2017-09-27 MED ORDER — BUPROPION HCL ER (SR) 200 MG PO TB12: 200.0000 mg | ORAL_TABLET | Freq: Every day | ORAL | 0 refills | Status: DC

## 2017-09-28 NOTE — Progress Notes (Signed)
Office: [phone]  /  Fax: [phone]   HPI:   Chief Complaint: OBESITY Nicole Rasmussen is here to discuss her progress with her obesity treatment plan. She is on the Category 2 plan and is following her eating plan approximately 15 % of the time. She states she is exercising 0 minutes 0 times per week. Nicole Rasmussen has not been following the meal plan as she had increase in celebration eating. Also, she has noticed increase in her cravings.  Her weight is 271 lb (122.9 kg) today and has gained 3 pounds since her last visit. She has lost 10 lbs since starting treatment with [name].  Vitamin D Deficiency Nicole Rasmussen has a diagnosis of vitamin D deficiency. She is currently taking prescription Vit D and denies nausea, vomiting or muscle weakness.  At risk for osteopenia and osteoporosis Nicole Rasmussen is at higher risk of osteopenia and osteoporosis due to vitamin D deficiency.   Depression with emotional eating behaviors Nicole Rasmussen is struggling with emotional eating and using food for comfort to the extent that it is negatively impacting her health. She often snacks when she is not hungry. Nicole Rasmussen sometimes feels she is out of control and then feels guilty that she made poor food choices. She has been working on behavior modification techniques to help reduce her emotional eating and has been somewhat successful. Her mood is stable and she shows no sign of suicidal or homicidal ideations.  Depression screen PHQ 2/9 02/09/2017  Decreased Interest 0  Down, Depressed, Hopeless 0  PHQ - 2 Score 0  Altered sleeping 1  Tired, decreased energy 1  Change in appetite 1  Feeling bad or failure about yourself  0  Trouble concentrating 0  Moving slowly or fidgety/restless 0  Suicidal thoughts 0  PHQ-9 Score 3  Difficult doing work/chores Not difficult at all    ALLERGIES: Allergies  Allergen Reactions  . Latex   . Shellfish Allergy Hives and Swelling  . Sulfonamide Derivatives     MEDICATIONS: Current Outpatient  Medications on File Prior to Visit  Medication Sig Dispense Refill  . fexofenadine (ALLEGRA) 180 MG tablet Take 180 mg by mouth daily.    Marland Kitchen gabapentin (NEURONTIN) 300 MG capsule Take 1 capsule (300 mg total) by mouth 3 (three) times daily. Can take once daily or 2 times a day, up to 3 times a day. 90 capsule 5  . ibuprofen (ADVIL,MOTRIN) 200 MG tablet Take 200 mg by mouth every 6 (six) hours as needed.    . meloxicam (MOBIC) 15 MG tablet Take 15 mg by mouth daily.    . rosuvastatin (CRESTOR) 10 MG tablet Take 10 mg by mouth daily.    . Vitamin D, Ergocalciferol, (DRISDOL) 50000 units CAPS capsule Take 1 capsule (50,000 Units total) by mouth every 7 (seven) days. 4 capsule 0   No current facility-administered medications on file prior to visit.     PAST MEDICAL HISTORY: Past Medical History:  Diagnosis Date  . Acid reflux   . Allergy   . Anemia   . Arthritis   . Asthma   . B12 deficiency   . Dysmetabolic syndrome X   . Dyspnea   . Food allergy    shellfish  . GERD (gastroesophageal reflux disease)   . HLD (hyperlipidemia)   . Joint pain   . Lower back pain   . Nerve pain    top of right thigh  . Prediabetes   . Sinus problem   . Vitamin B 12 deficiency   .  Vitamin D deficiency     PAST SURGICAL HISTORY: Past Surgical History:  Procedure Laterality Date  . CESAREAN SECTION    . FOOT SURGERY Bilateral 1980's  . MOUTH SURGERY     2011    SOCIAL HISTORY: Social History   Tobacco Use  . Smoking status: Former Smoker    Packs/day: 1.00    Years: 20.00    Pack years: 20.00    Types: Cigarettes    Last attempt to quit: 05/02/1996    Years since quitting: 21.4  . Smokeless tobacco: Never Used  Substance Use Topics  . Alcohol use: Yes  . Drug use: No    FAMILY HISTORY: Family History  Problem Relation Age of Onset  . Cancer Mother   . Hypertension Mother   . Hyperlipidemia Mother   . Heart disease Mother   . Cancer Father   . Kidney disease Father   .  Hyperlipidemia Sister   . Diabetes Sister   . Hypertension Sister   . Hyperlipidemia Brother   . Hypertension Brother     ROS: Review of Systems  Constitutional: Negative for weight loss.  Gastrointestinal: Negative for nausea and vomiting.  Musculoskeletal:       Negative muscle weakness  Psychiatric/Behavioral: Positive for depression. Negative for suicidal ideas.    PHYSICAL EXAM: Blood pressure 122/80, pulse 76, temperature 98.3 F (36.8 C), temperature source Oral, height 5\' 7"  (1.702 m), weight 271 lb (122.9 kg), last menstrual period 10/11/2010, SpO2 97 %. Body mass index is 42.44 kg/m. Physical Exam  Constitutional: She is oriented to person, place, and time. She appears well-developed and well-nourished.  Cardiovascular: Normal rate.  Pulmonary/Chest: Effort normal.  Musculoskeletal: Normal range of motion.  Neurological: She is oriented to person, place, and time.  Skin: Skin is warm and dry.  Psychiatric: She has a normal mood and affect. Her behavior is normal.  Vitals reviewed.   RECENT LABS AND TESTS: BMET    Component Value Date/Time   NA 139 02/09/2017 1250   K 3.7 02/09/2017 1250   CL 103 02/09/2017 1250   CO2 23 02/09/2017 1250   GLUCOSE 85 02/09/2017 1250   GLUCOSE 99 06/08/2013 2035   BUN 9 02/09/2017 1250   CREATININE 0.66 02/09/2017 1250   CALCIUM 9.4 02/09/2017 1250   GFRNONAA 98 02/09/2017 1250   GFRAA 113 02/09/2017 1250   Lab Results  Component Value Date   HGBA1C 5.9 (H) 02/09/2017   Lab Results  Component Value Date   INSULIN 9.5 02/09/2017   CBC    Component Value Date/Time   WBC 7.2 02/09/2017 1250   WBC 9.7 06/08/2013 2035   RBC 4.97 02/09/2017 1250   RBC 4.70 06/08/2013 2035   HGB 13.3 02/09/2017 1250   HCT 42.3 02/09/2017 1250   PLT  06/08/2013 2035    PLATELET CLUMPS NOTED ON SMEAR, COUNT APPEARS ADEQUATE   MCV 85 02/09/2017 1250   MCH 26.8 02/09/2017 1250   MCH 27.2 06/08/2013 2035   MCHC 31.4 (L) 02/09/2017 1250    MCHC 32.2 06/08/2013 2035   RDW 14.7 02/09/2017 1250   LYMPHSABS 2.9 02/09/2017 1250   MONOABS 0.8 06/08/2013 2035   EOSABS 0.1 02/09/2017 1250   BASOSABS 0.0 02/09/2017 1250   Iron/TIBC/Ferritin/ %Sat No results found for: IRON, TIBC, FERRITIN, IRONPCTSAT Lipid Panel     Component Value Date/Time   CHOL 216 (H) 02/09/2017 1250   TRIG 66 02/09/2017 1250   HDL 43 02/09/2017 1250   LDLCALC  160 (H) 02/09/2017 1250   Hepatic Function Panel     Component Value Date/Time   PROT 7.0 02/09/2017 1250   ALBUMIN 4.3 02/09/2017 1250   AST 17 02/09/2017 1250   ALT 24 02/09/2017 1250   ALKPHOS 85 02/09/2017 1250   BILITOT 0.4 02/09/2017 1250      Component Value Date/Time   TSH 1.230 02/09/2017 1250  Results for CLAUDETTE, WERMUTH (MRN [id number]) as of 09/28/2017 07:30  Ref. Range 02/09/2017 12:50  Vitamin D, 25-Hydroxy Latest Ref Range: 30.0 - 100.0 ng/mL 21.4 (L)    ASSESSMENT AND PLAN: Vitamin D deficiency  Other depression - with emotional eating - Plan: buPROPion (WELLBUTRIN SR) 200 MG 12 hr tablet  At risk for osteoporosis  Class 3 severe obesity with serious comorbidity and body mass index (BMI) of 40.0 to 44.9 in adult, unspecified obesity type ([place])  PLAN:  Vitamin D Deficiency Nicole Rasmussen was informed that low vitamin D levels contributes to fatigue and are associated with obesity, breast, and colon cancer. Nicole Rasmussen agrees to continue taking prescription Vit D @50 ,000 IU every week and will follow up for routine testing of vitamin D, at least 2-3 times per year. She was informed of the risk of over-replacement of vitamin D and agrees to not increase her dose unless she discusses this with [name] first. Nicole Rasmussen agrees to follow up with our clinic in 2 weeks.  At risk for osteopenia and osteoporosis Nicole Rasmussen is at risk for osteopenia and osteoporsis due to her vitamin D deficiency. She was encouraged to take her vitamin D and follow her higher calcium diet and increase strengthening  exercise to help strengthen her bones and decrease her risk of osteopenia and osteoporosis.  Depression with Emotional Eating Behaviors We discussed behavior modification techniques today to help Nicole Rasmussen deal with her emotional eating and depression. Nicole Rasmussen agrees to continue taking Wellbutrin SR 200 mg qd #30 and we will refill for 1 month. Nicole Rasmussen agrees to follow up with our clinic in 2 weeks.  Obesity Nicole Rasmussen is currently in the action stage of change. As such, her goal is to continue with weight loss efforts She has agreed to follow the Category 2 plan Nicole Rasmussen has been instructed to work up to a goal of 150 minutes of combined cardio and strengthening exercise per week for weight loss and overall health benefits. We discussed the following Behavioral Modification Strategies today: increasing lean protein intake and work on meal planning and easy cooking plans   Nicole Rasmussen has agreed to follow up with our clinic in 2 weeks. She was informed of the importance of frequent follow up visits to maximize her success with intensive lifestyle modifications for her multiple health conditions.   OBESITY BEHAVIORAL INTERVENTION VISIT  Today's visit was # 9 out of 22.  Starting weight: 281 lbs Starting date: 02/09/17 Today's weight : 271 lbs Today's date: 09/27/2017 Total lbs lost to date: 10 (Patients must lose 7 lbs in the first 6 months to continue with counseling)   ASK: We discussed the diagnosis of obesity with Mickel Crow today and Melodie agreed to give [name] permission to discuss obesity behavioral modification therapy today.  ASSESS: Nahla has the diagnosis of obesity and her BMI today is 42.43 Avaree is in the action stage of change   ADVISE: Vertie was educated on the multiple health risks of obesity as well as the benefit of weight loss to improve her health. She was advised of the need for long term treatment and the importance of  lifestyle modifications.  AGREE: Multiple dietary  modification options and treatment options were discussed and  Marsi agreed to the above obesity treatment plan.   Wilhemena Durie, am acting as transcriptionist for Lacy Duverney, PA-C I, Lacy Duverney [facility], have reviewed this note and agree with its content

## 2017-10-12 ENCOUNTER — Encounter: Payer: Self-pay | Admitting: Internal Medicine

## 2017-10-17 ENCOUNTER — Ambulatory Visit (INDEPENDENT_AMBULATORY_CARE_PROVIDER_SITE_OTHER): Payer: 59 | Admitting: Physician Assistant

## 2017-10-18 ENCOUNTER — Ambulatory Visit (INDEPENDENT_AMBULATORY_CARE_PROVIDER_SITE_OTHER): Payer: 59 | Admitting: Physician Assistant

## 2017-10-18 VITALS — BP 115/81 | HR 92 | Temp 98.2°F | Ht 67.0 in | Wt 271.0 lb

## 2017-10-18 DIAGNOSIS — Z6841 Body Mass Index (BMI) 40.0 and over, adult: Secondary | ICD-10-CM | POA: Diagnosis not present

## 2017-10-18 DIAGNOSIS — R7303 Prediabetes: Secondary | ICD-10-CM

## 2017-10-18 DIAGNOSIS — E559 Vitamin D deficiency, unspecified: Secondary | ICD-10-CM | POA: Diagnosis not present

## 2017-10-18 DIAGNOSIS — Z9189 Other specified personal risk factors, not elsewhere classified: Secondary | ICD-10-CM | POA: Diagnosis not present

## 2017-10-18 DIAGNOSIS — F3289 Other specified depressive episodes: Secondary | ICD-10-CM | POA: Diagnosis not present

## 2017-10-18 MED ORDER — VITAMIN D (ERGOCALCIFEROL) 1.25 MG (50000 UNIT) PO CAPS: 50000.0000 [IU] | ORAL_CAPSULE | ORAL | 0 refills | Status: DC

## 2017-10-18 MED ORDER — TOPIRAMATE 50 MG PO TABS: 50.0000 mg | ORAL_TABLET | Freq: Every day | ORAL | 0 refills | Status: DC

## 2017-10-19 NOTE — Progress Notes (Signed)
Office: [phone]  /  Fax: [phone]   HPI:   Chief Complaint: OBESITY Nicole Rasmussen is here to discuss her progress with her obesity treatment plan. She is on the Category 2 plan and is following her eating plan approximately 30 % of the time. She states she is walking for 30 minutes 2 times per week. Caris maintained her weight. She continues to have challenges with emotional eating and has not been following the meal plan. She requests a different medicine for cravings.  Her weight is 271 lb (122.9 kg) today and has not lost weight since her last visit. She has lost 10 lbs since starting treatment with [name].  Pre-Diabetes Nicole Rasmussen has a diagnosis of pre-diabetes based on her elevated Hgb A1c and was informed this puts her at greater risk of developing diabetes. She is not taking metformin currently and continues to work on diet and exercise to decrease risk of diabetes. She declines any medications and she denies polyphagia, nausea, or hypoglycemia.  At risk for diabetes Nicole Rasmussen is at higher than average risk for developing diabetes due to her obesity and pre-diabetes. She currently denies polyuria or polydipsia.  Vitamin D Deficiency Nicole Rasmussen has a diagnosis of vitamin D deficiency. She is currently taking prescription Vit D and denies nausea, vomiting or muscle weakness.  Depression with emotional eating behaviors Nicole Rasmussen is struggling with emotional eating and using food for comfort to the extent that it is negatively impacting her health. She often snacks when she is not hungry. Nicole Rasmussen sometimes feels she is out of control and then feels guilty that she made poor food choices. She has been working on behavior modification techniques to help reduce her emotional eating and has been somewhat successful. Her mood is stable and she shows no sign of suicidal or homicidal ideations.  Depression screen PHQ 2/9 02/09/2017  Decreased Interest 0  Down, Depressed, Hopeless 0  PHQ - 2 Score 0  Altered  sleeping 1  Tired, decreased energy 1  Change in appetite 1  Feeling bad or failure about yourself  0  Trouble concentrating 0  Moving slowly or fidgety/restless 0  Suicidal thoughts 0  PHQ-9 Score 3  Difficult doing work/chores Not difficult at all    ALLERGIES: Allergies  Allergen Reactions  . Latex   . Shellfish Allergy Hives and Swelling  . Sulfonamide Derivatives     MEDICATIONS: Current Outpatient Medications on File Prior to Visit  Medication Sig Dispense Refill  . fexofenadine (ALLEGRA) 180 MG tablet Take 180 mg by mouth daily.    Marland Kitchen gabapentin (NEURONTIN) 300 MG capsule Take 1 capsule (300 mg total) by mouth 3 (three) times daily. Can take once daily or 2 times a day, up to 3 times a day. 90 capsule 5  . ibuprofen (ADVIL,MOTRIN) 200 MG tablet Take 200 mg by mouth every 6 (six) hours as needed.    . meloxicam (MOBIC) 15 MG tablet Take 15 mg by mouth daily.    . rosuvastatin (CRESTOR) 10 MG tablet Take 10 mg by mouth daily.     No current facility-administered medications on file prior to visit.     PAST MEDICAL HISTORY: Past Medical History:  Diagnosis Date  . Acid reflux   . Allergy   . Anemia   . Arthritis   . Asthma   . B12 deficiency   . Dysmetabolic syndrome X   . Dyspnea   . Food allergy    shellfish  . GERD (gastroesophageal reflux disease)   .  HLD (hyperlipidemia)   . Joint pain   . Lower back pain   . Nerve pain    top of right thigh  . Prediabetes   . Sinus problem   . Vitamin B 12 deficiency   . Vitamin D deficiency     PAST SURGICAL HISTORY: Past Surgical History:  Procedure Laterality Date  . CESAREAN SECTION    . FOOT SURGERY Bilateral 1980's  . MOUTH SURGERY     2011    SOCIAL HISTORY: Social History   Tobacco Use  . Smoking status: Former Smoker    Packs/day: 1.00    Years: 20.00    Pack years: 20.00    Types: Cigarettes    Last attempt to quit: 05/02/1996    Years since quitting: 21.4  . Smokeless tobacco: Never Used    Substance Use Topics  . Alcohol use: Yes  . Drug use: No    FAMILY HISTORY: Family History  Problem Relation Age of Onset  . Cancer Mother   . Hypertension Mother   . Hyperlipidemia Mother   . Heart disease Mother   . Cancer Father   . Kidney disease Father   . Hyperlipidemia Sister   . Diabetes Sister   . Hypertension Sister   . Hyperlipidemia Brother   . Hypertension Brother     ROS: Review of Systems  Constitutional: Negative for weight loss.  Gastrointestinal: Negative for nausea and vomiting.  Genitourinary: Negative for frequency.  Musculoskeletal:       Negative muscle weakness  Endo/Heme/Allergies: Negative for polydipsia.       Negative polyphagia Negative hypoglycemia  Psychiatric/Behavioral: Positive for depression. Negative for suicidal ideas.    PHYSICAL EXAM: Blood pressure 115/81, pulse 92, temperature 98.2 F (36.8 C), temperature source Oral, height 5\' 7"  (1.702 m), weight 271 lb (122.9 kg), last menstrual period 10/11/2010, SpO2 95 %. Body mass index is 42.44 kg/m. Physical Exam  Constitutional: She is oriented to person, place, and time. She appears well-developed and well-nourished.  Cardiovascular: Normal rate.  Pulmonary/Chest: Effort normal.  Musculoskeletal: Normal range of motion.  Neurological: She is oriented to person, place, and time.  Skin: Skin is warm and dry.  Psychiatric: She has a normal mood and affect. Her behavior is normal.  Vitals reviewed.   RECENT LABS AND TESTS: BMET    Component Value Date/Time   NA 139 02/09/2017 1250   K 3.7 02/09/2017 1250   CL 103 02/09/2017 1250   CO2 23 02/09/2017 1250   GLUCOSE 85 02/09/2017 1250   GLUCOSE 99 06/08/2013 2035   BUN 9 02/09/2017 1250   CREATININE 0.66 02/09/2017 1250   CALCIUM 9.4 02/09/2017 1250   GFRNONAA 98 02/09/2017 1250   GFRAA 113 02/09/2017 1250   Lab Results  Component Value Date   HGBA1C 5.9 (H) 02/09/2017   Lab Results  Component Value Date   INSULIN  9.5 02/09/2017   CBC    Component Value Date/Time   WBC 7.2 02/09/2017 1250   WBC 9.7 06/08/2013 2035   RBC 4.97 02/09/2017 1250   RBC 4.70 06/08/2013 2035   HGB 13.3 02/09/2017 1250   HCT 42.3 02/09/2017 1250   PLT  06/08/2013 2035    PLATELET CLUMPS NOTED ON SMEAR, COUNT APPEARS ADEQUATE   MCV 85 02/09/2017 1250   MCH 26.8 02/09/2017 1250   MCH 27.2 06/08/2013 2035   MCHC 31.4 (L) 02/09/2017 1250   MCHC 32.2 06/08/2013 2035   RDW 14.7 02/09/2017 1250   LYMPHSABS  2.9 02/09/2017 1250   MONOABS 0.8 06/08/2013 2035   EOSABS 0.1 02/09/2017 1250   BASOSABS 0.0 02/09/2017 1250   Iron/TIBC/Ferritin/ %Sat No results found for: IRON, TIBC, FERRITIN, IRONPCTSAT Lipid Panel     Component Value Date/Time   CHOL 216 (H) 02/09/2017 1250   TRIG 66 02/09/2017 1250   HDL 43 02/09/2017 1250   LDLCALC 160 (H) 02/09/2017 1250   Hepatic Function Panel     Component Value Date/Time   PROT 7.0 02/09/2017 1250   ALBUMIN 4.3 02/09/2017 1250   AST 17 02/09/2017 1250   ALT 24 02/09/2017 1250   ALKPHOS 85 02/09/2017 1250   BILITOT 0.4 02/09/2017 1250      Component Value Date/Time   TSH 1.230 02/09/2017 1250  Results for BRYCELYN, GAMBINO (MRN [id number]) as of 10/19/2017 08:38  Ref. Range 02/09/2017 12:50  Vitamin D, 25-Hydroxy Latest Ref Range: 30.0 - 100.0 ng/mL 21.4 (L)    ASSESSMENT AND PLAN: Vitamin D deficiency - Plan: Vitamin D, Ergocalciferol, (DRISDOL) 50000 units CAPS capsule  Prediabetes  Other depression - with emotional eating - Plan: topiramate (TOPAMAX) 50 MG tablet  At risk for diabetes mellitus  Class 3 severe obesity with serious comorbidity and body mass index (BMI) of 40.0 to 44.9 in adult, unspecified obesity type [facility])  PLAN:  Pre-Diabetes Nicole Rasmussen will continue to work on weight loss, diet, exercise, and decreasing simple carbohydrates in her diet to help decrease the risk of diabetes. We dicussed metformin including benefits and risks. She was informed  that eating too many simple carbohydrates or too many calories at one sitting increases the likelihood of GI side effects. Nicole Rasmussen declined metformin for now and a prescription was not written today. Nicole Rasmussen agrees to follow up with our clinic in 4 weeks as directed to monitor her progress.  Diabetes risk counselling Nicole Rasmussen was given extended (15 minutes) diabetes prevention counseling today. She is 57 y.o. female and has risk factors for diabetes including obesity and pre-diabetes. We discussed intensive lifestyle modifications today with an emphasis on weight loss as well as increasing exercise and decreasing simple carbohydrates in her diet.  Vitamin D Deficiency Nicole Rasmussen was informed that low vitamin D levels contributes to fatigue and are associated with obesity, breast, and colon cancer. Nicole Rasmussen agrees to continue taking prescription Vit D @50 ,000 IU every week #4 and we will refill for 1 month. She will follow up for routine testing of vitamin D, at least 2-3 times per year. She was informed of the risk of over-replacement of vitamin D and agrees to not increase her dose unless she discusses this with [name] first. Nicole Rasmussen agrees to follow up with our clinic in 4 weeks.  Depression with Emotional Eating Behaviors We discussed behavior modification techniques today to help Nicole Rasmussen deal with her emotional eating and depression. Nicole Rasmussen agrees to start topiramate 50 mg qd #30 with no refills and she agrees to follow up with our clinic in 4 weeks.  Obesity Nicole Rasmussen is currently in the action stage of change. As such, her goal is to continue with weight loss efforts She has agreed to portion control better and make smarter food choices, such as increase vegetables and decrease simple carbohydrates  Nicole Rasmussen has been instructed to work up to a goal of 150 minutes of combined cardio and strengthening exercise per week for weight loss and overall health benefits. We discussed the following Behavioral Modification Strategies  today: increasing lean protein intake and emotional eating strategies   Nicole Rasmussen has agreed to  follow up with our clinic in 4 weeks. She was informed of the importance of frequent follow up visits to maximize her success with intensive lifestyle modifications for her multiple health conditions.   OBESITY BEHAVIORAL INTERVENTION VISIT  Today's visit was # 10 out of 22.  Starting weight: 281 lbs Starting date: 02/09/17 Today's weight : 271 lbs  Today's date: 10/18/2017 Total lbs lost to date: 10 (Patients must lose 7 lbs in the first 6 months to continue with counseling)   ASK: We discussed the diagnosis of obesity with Nicole Rasmussen today and Nicole Rasmussen agreed to give [name] permission to discuss obesity behavioral modification therapy today.  ASSESS: Nicole Rasmussen has the diagnosis of obesity and her BMI today is 42.43 Nicole Rasmussen is in the action stage of change   ADVISE: Nicole Rasmussen was educated on the multiple health risks of obesity as well as the benefit of weight loss to improve her health. She was advised of the need for long term treatment and the importance of lifestyle modifications.  AGREE: Multiple dietary modification options and treatment options were discussed and  Nicole Rasmussen agreed to the above obesity treatment plan.   Wilhemena Durie, am acting as transcriptionist for Lacy Duverney, PA-C I, Lacy Duverney [facility] have reviewed this note and agree with its content

## 2017-11-11 ENCOUNTER — Emergency Department: Payer: 59

## 2017-11-11 ENCOUNTER — Emergency Department
Admission: EM | Admit: 2017-11-11 | Discharge: 2017-11-11 | Disposition: A | Discharge status: Home / Self Care | Payer: 59 | Attending: Emergency Medicine | Admitting: Emergency Medicine

## 2017-11-11 ENCOUNTER — Encounter: Payer: Self-pay | Admitting: Emergency Medicine

## 2017-11-11 ENCOUNTER — Other Ambulatory Visit: Payer: Self-pay

## 2017-11-11 DIAGNOSIS — R0602 Shortness of breath: Secondary | ICD-10-CM | POA: Insufficient documentation

## 2017-11-11 DIAGNOSIS — Z87891 Personal history of nicotine dependence: Secondary | ICD-10-CM | POA: Diagnosis not present

## 2017-11-11 DIAGNOSIS — J45909 Unspecified asthma, uncomplicated: Secondary | ICD-10-CM | POA: Insufficient documentation

## 2017-11-11 DIAGNOSIS — Z79899 Other long term (current) drug therapy: Secondary | ICD-10-CM | POA: Diagnosis not present

## 2017-11-11 DIAGNOSIS — R42 Dizziness and giddiness: Secondary | ICD-10-CM | POA: Diagnosis not present

## 2017-11-11 DIAGNOSIS — R911 Solitary pulmonary nodule: Secondary | ICD-10-CM | POA: Insufficient documentation

## 2017-11-11 DIAGNOSIS — Z91040 Latex allergy status: Secondary | ICD-10-CM | POA: Diagnosis not present

## 2017-11-11 DIAGNOSIS — R002 Palpitations: Secondary | ICD-10-CM | POA: Insufficient documentation

## 2017-11-11 LAB — BASIC METABOLIC PANEL
Anion gap: 8 (ref 5–15)
BUN: 10 mg/dL (ref 6–20)
CALCIUM: 9.1 mg/dL (ref 8.9–10.3)
CO2: 27 mmol/L (ref 22–32)
CREATININE: 0.73 mg/dL (ref 0.44–1.00)
Chloride: 110 mmol/L (ref 98–111)
GFR calc Af Amer: >60 mL/min (ref >60)
Glucose, Bld: 90 mg/dL (ref 70–99)
Potassium: 3.1 mmol/L — ABNORMAL LOW (ref 3.5–5.1)
SODIUM: 145 mmol/L (ref 135–145)

## 2017-11-11 LAB — URINALYSIS, COMPLETE (UACMP) WITH MICROSCOPIC
Bilirubin Urine: NEGATIVE
GLUCOSE, UA: NEGATIVE mg/dL
Ketones, ur: NEGATIVE mg/dL
Leukocytes, UA: NEGATIVE
NITRITE: NEGATIVE
Protein, ur: NEGATIVE mg/dL
SPECIFIC GRAVITY, URINE: 1.016 (ref 1.005–1.030)
pH: 6 (ref 5.0–8.0)

## 2017-11-11 LAB — CBC
HCT: 42.3 % (ref 35.0–47.0)
Hemoglobin: 14.2 g/dL (ref 12.0–16.0)
MCH: 28.1 pg (ref 26.0–34.0)
MCHC: 33.6 g/dL (ref 32.0–36.0)
MCV: 83.8 fL (ref 80.0–100.0)
PLATELETS: 124 10*3/uL — AB (ref 150–440)
RBC: 5.05 MIL/uL (ref 3.80–5.20)
RDW: 14.4 % (ref 11.5–14.5)
WBC: 8 10*3/uL (ref 3.6–11.0)

## 2017-11-11 LAB — MAGNESIUM: Magnesium: 2.1 mg/dL (ref 1.7–2.4)

## 2017-11-11 LAB — FIBRIN DERIVATIVES D-DIMER (ARMC ONLY): Fibrin derivatives D-dimer (ARMC): 510.37 ng/mL (FEU) — ABNORMAL HIGH (ref 0.00–499.00)

## 2017-11-11 LAB — TROPONIN I: Troponin I: <0.03 ng/mL (ref <0.03)

## 2017-11-11 LAB — TSH: TSH: 1.388 u[IU]/mL (ref 0.350–4.500)

## 2017-11-11 MED ORDER — POTASSIUM CHLORIDE CRYS ER 20 MEQ PO TBCR
EXTENDED_RELEASE_TABLET | ORAL | Status: AC
  Filled 2017-11-11: qty 2

## 2017-11-11 MED ORDER — IOPAMIDOL (ISOVUE-370) INJECTION 76%
75.0000 mL | Freq: Once | INTRAVENOUS | Status: AC | PRN
  Administered 2017-11-11: 75 mL via INTRAVENOUS

## 2017-11-11 MED ORDER — POTASSIUM CHLORIDE CRYS ER 20 MEQ PO TBCR
40.0000 meq | EXTENDED_RELEASE_TABLET | Freq: Once | ORAL | Status: AC
  Administered 2017-11-11: 40 meq via ORAL
  Filled 2017-11-11: qty 2

## 2017-11-11 NOTE — ED Provider Notes (Signed)
-----------------------------------------   9:00 PM on 11/11/2017 -----------------------------------------  Assuming care from Dr. Alfred Levins.  In short, Nicole Rasmussen is a 57 y.o. female with a chief complaint of palpitations.  Refer to the original H&P for additional details.  The current plan of care is to follow up CTA results.  Anticipate discharge.   ----------------------------------------- 10:08 PM on 11/11/2017 -----------------------------------------  CT scan is reassuring with no evidence of PE.  Incidental finding of right upper lobe pulmonary nodule was identified.  I reported the results including the pulmonary nodule to the patient verbally as well as in written discharge instructions.  She will follow-up as an outpatient as recommended by Dr. Alfred Levins.  The patient is in no distress and agrees with the plan.  I gave my usual and customary return precautions.    Hinda Kehr, MD 11/11/17 2212

## 2017-11-11 NOTE — ED Provider Notes (Signed)
[facility] Emergency Department Provider Note  ____________________________________________  Time seen: Approximately 8:45 PM  I have reviewed the triage vital signs and the nursing notes.   HISTORY  Chief Complaint Palpitations   HPI Nicole Rasmussen is a 57 y.o. female with history as listed below who presents for evaluation of palpitations.  Patient reports that she was sitting and eating at 3:30 PM when she started having palpitations.  She started feeling dizzy like she was going to pass out and developed shortness of breath.  She denies ever having similar symptoms.  Patient reports that the palpitations left 45 minutes and resolved without intervention.  She reports that her sister was recently diagnosed with some sort of arrhythmia and underwent an ablation.  She denies any personal or family history of coronary artery disease, blood clots, recent travel immobilization, leg pain or swelling, hemoptysis, exogenous hormones, or history of cancer.  She reports that this time that her palpitations are gone, she is no longer feeling dizzy but continues to have persistent mild shortness of breath.  Past Medical History:  Diagnosis Date  . Acid reflux   . Allergy   . Anemia   . Arthritis   . Asthma   . B12 deficiency   . Dysmetabolic syndrome X   . Dyspnea   . Food allergy    shellfish  . GERD (gastroesophageal reflux disease)   . HLD (hyperlipidemia)   . Joint pain   . Lower back pain   . Nerve pain    top of right thigh  . Prediabetes   . Sinus problem   . Vitamin B 12 deficiency   . Vitamin D deficiency     Patient Active Problem List   Diagnosis Date Noted  . Other fatigue 02/09/2017  . Shortness of breath on exertion 02/09/2017  . Hyperglycemia 02/09/2017  . Vitamin D deficiency 02/09/2017  . Dysesthesia 01/27/2016  . 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 Lumbosacral radiculopathy at L5  01/27/2016  . Headache 06/08/2013  . Atypical chest pain 06/08/2013  . HYPERCHOLESTEROLEMIA 09/17/2007  . ARTHRITIS 09/17/2007  . HEARTBURN 09/17/2007  . CHANGE IN BOWELS 09/17/2007    Past Surgical History:  Procedure Laterality Date  . CESAREAN SECTION    . FOOT SURGERY Bilateral 1980's  . MOUTH SURGERY     2011    Prior to Admission medications   Medication Sig Start Date End Date Taking? Authorizing Provider  fexofenadine (ALLEGRA) 180 MG tablet Take 180 mg by mouth daily.    [provider]  gabapentin (NEURONTIN) 300 MG capsule Take 1 capsule (300 mg total) by mouth 3 (three) times daily. Can take once daily or 2 times a day, up to 3 times a day. 04/18/16   Star Age, MD  ibuprofen (ADVIL,MOTRIN) 200 MG tablet Take 200 mg by mouth every 6 (six) hours as needed.    [provider]  meloxicam (MOBIC) 15 MG tablet Take 15 mg by mouth daily.    [provider]  rosuvastatin (CRESTOR) 10 MG tablet Take 10 mg by mouth daily.    [provider]  topiramate (TOPAMAX) 50 MG tablet Take 1 tablet (50 mg total) by mouth daily. 10/18/17   Waldon Merl, PA-C  Vitamin D, Ergocalciferol, (DRISDOL) 50000 units CAPS capsule Take 1 capsule (50,000 Units total) by mouth every 7 (seven) days. 10/18/17   Waldon Merl, PA-C    Allergies Latex; Shellfish allergy; and Sulfonamide  derivatives  Family History  Problem Relation Age of Onset  . Cancer Mother   . Hypertension Mother   . Hyperlipidemia Mother   . Heart disease Mother   . Cancer Father   . Kidney disease Father   . Hyperlipidemia Sister   . Diabetes Sister   . Hypertension Sister   . Hyperlipidemia Brother   . Hypertension Brother     Social History Social History   Tobacco Use  . Smoking status: Former Smoker    Packs/day: 1.00    Years: 20.00    Pack years: 20.00    Types: Cigarettes    Last attempt to quit: 05/02/1996    Years since quitting: 21.5  . Smokeless tobacco: Never  Used  Substance Use Topics  . Alcohol use: Yes  . Drug use: No    Review of Systems  Constitutional: Negative for fever. Eyes: Negative for visual changes. ENT: Negative for sore throat. Neck: No neck pain  Cardiovascular: Negative for chest pain. + palpitations Respiratory: + shortness of breath. Gastrointestinal: Negative for abdominal pain, vomiting or diarrhea. Genitourinary: Negative for dysuria. Musculoskeletal: Negative for back pain. Skin: Negative for rash. Neurological: Negative for headaches, weakness or numbness. Psych: No SI or HI  ____________________________________________   PHYSICAL EXAM:  VITAL SIGNS: ED Triage Vitals  Enc Vitals Group     BP 11/11/17 1543 129/86     Pulse Rate 11/11/17 1543 96     Resp 11/11/17 1543 16     Temp 11/11/17 1543 98.5 F (36.9 C)     Temp Source 11/11/17 1543 Oral     SpO2 11/11/17 1543 97 %     Weight 11/11/17 1541 268 lb (121.6 kg)     Height 11/11/17 1541 5\' 8"  (1.727 m)     Head Circumference --      Peak Flow --      Pain Score 11/11/17 1541 0     Pain Loc --      Pain Edu? --      Excl. in [place]? --     Constitutional: Alert and oriented. Well appearing and in no apparent distress. HEENT:      Head: Normocephalic and atraumatic.         Eyes: Conjunctivae are normal. Sclera is non-icteric.       Mouth/Throat: Mucous membranes are moist.       Neck: Supple with no signs of meningismus. Cardiovascular: Regular rate and rhythm. No murmurs, gallops, or rubs. 2+ symmetrical distal pulses are present in all extremities. No JVD. Respiratory: Normal respiratory effort. Lungs are clear to auscultation bilaterally. No wheezes, crackles, or rhonchi.  Gastrointestinal: Soft, non tender, and non distended with positive bowel sounds. No rebound or guarding. Genitourinary: No CVA tenderness. Musculoskeletal: Nontender with normal range of motion in all extremities. No edema, cyanosis, or erythema of extremities. Neurologic:  Normal speech and language. Face is symmetric. Moving all extremities. No gross focal neurologic deficits are appreciated. Skin: Skin is warm, dry and intact. No rash noted. Psychiatric: Mood and affect are normal. Speech and behavior are normal.  ____________________________________________   LABS (all labs ordered are listed, but only abnormal results are displayed)  Labs Reviewed  BASIC METABOLIC PANEL - Abnormal; Notable for the following components:      Result Value   Potassium 3.1 (*)    All other components within normal limits  CBC - Abnormal; Notable for the following components:   Platelets 124 (*)    All other  components within normal limits  URINALYSIS, COMPLETE (UACMP) WITH MICROSCOPIC - Abnormal; Notable for the following components:   Color, Urine YELLOW (*)    APPearance CLEAR (*)    Hgb urine dipstick SMALL (*)    Bacteria, UA RARE (*)    All other components within normal limits  FIBRIN DERIVATIVES D-DIMER ([facility] ONLY) - Abnormal; Notable for the following components:   Fibrin derivatives D-dimer ([facility]) 510.37 (*)    All other components within normal limits  TROPONIN I  TSH  MAGNESIUM   ____________________________________________  EKG  ED ECG REPORT I, Rudene Re, the attending physician, personally viewed and interpreted this ECG.  Normal sinus rhythm, rate of 87, normal intervals, normal axis, no ST elevations or depressions.  No significant changes when compared to prior from 2018 ____________________________________________  RADIOLOGY  I have personally reviewed the images performed during this visit and I agree with the Radiologist's read.   Interpretation by Radiologist:  Dg Chest 2 View  Result Date: 11/11/2017 CLINICAL DATA:  Former smoker 1998, hx of asthma now with heart palpitations since yesterday EXAM: CHEST - 2 VIEW COMPARISON:  None. FINDINGS: The heart size and mediastinal contours are within normal limits. Both lungs are  clear. The visualized skeletal structures are unremarkable. IMPRESSION: No active cardiopulmonary disease. Electronically Signed   By: Nolon Nations M.D.   On: 11/11/2017 16:25     ____________________________________________   PROCEDURES  Procedure(s) performed: None Procedures Critical Care performed:  None ____________________________________________   INITIAL IMPRESSION / ASSESSMENT AND PLAN / ED COURSE  57 y.o. female with history as listed below who presents for evaluation of palpitations followed by dizziness and shortness of breath.  At this time patient reports of palpitations and shortness of breath has resolved however persistent mild shortness of breath.  She has normal work of breathing, normal sats, vitals are within normal limits, chest x-ray with no evidence of pneumonia or pneumothorax.  EKG showing no evidence of dysrhythmias or ischemia.  Patient will be monitor on telemetry to evaluate for any arrhythmias.  Labs showing mild hypokalemia which was supplemented p.o., elevated d-dimer, CTA pending.  No evidence of anemia.  Troponin is negative.  TSH and magnesium are also pending.  If patient remains with no further episodes of arrhythmias, has normal labs and negative CTA plan to discharge home with follow-up with cardiology.  Otherwise patient will be admitted.  Care transferred to Dr. Karma Greaser.      As part of my medical decision making, I reviewed the following data within the [place] notes reviewed and incorporated, Labs reviewed , EKG interpreted , Old EKG reviewed, Radiograph reviewed , Notes from prior ED visits and [place] Controlled Substance Database    Pertinent labs & imaging results that were available during my care of the patient were reviewed by me and considered in my medical decision making (see chart for details).    ____________________________________________   FINAL CLINICAL IMPRESSION(S) / ED DIAGNOSES  Final  diagnoses:  Palpitations      NEW MEDICATIONS STARTED DURING THIS VISIT:  ED Discharge Orders    None       Note:  This document was prepared using Dragon voice recognition software and may include unintentional dictation errors.    Rudene Re, MD 11/11/17 2049

## 2017-11-11 NOTE — ED Notes (Signed)
Peripheral IV discontinued. Catheter intact. No signs of infiltration or redness. Gauze applied to IV site.    Discharge instructions reviewed with patient. Questions fielded by this RN. Patient verbalizes understanding of instructions. Patient discharged home in stable condition per DrForbach. No acute distress noted at time of discharge.

## 2017-11-11 NOTE — ED Triage Notes (Signed)
Arrives with c/o an episode of palpitations and feeling "like I was going to passout".  Episode occurred while sitting and eating lunch.

## 2017-11-11 NOTE — ED Notes (Signed)
ED Provider at bedside. 

## 2017-11-11 NOTE — Discharge Instructions (Addendum)
Your workup in the Emergency Department today was reassuring.  We did not find any specific abnormalities.  We recommend you drink plenty of fluids, take your regular medications and/or any new ones prescribed today, and follow up with the doctor(s) listed in these documents as recommended.  An incidental finding identified on your chest CT today was that of a pulmonary nodule in the right upper lobe.  Most of the time these are benign and sometimes even resolve on their own.  The radiologist recommended that you talk with your primary care provider about having a repeat CT scan of your chest in 6 to 12 months to see if the nodule has resolved, if it is persistent, or if it is getting larger.  This is unlikely to have anything to do with your symptoms today, but as it was identified on the CT scan, we wanted you to know about it so that you could pass along the information to your doctor.  Return to the Emergency Department if you develop new or worsening symptoms that concern you.

## 2017-11-11 NOTE — ED Notes (Signed)
Patient states she wants two new pills "because you touched it", getting two more for patient to self administer.

## 2017-11-15 ENCOUNTER — Encounter (INDEPENDENT_AMBULATORY_CARE_PROVIDER_SITE_OTHER): Payer: Self-pay

## 2017-11-15 ENCOUNTER — Ambulatory Visit (INDEPENDENT_AMBULATORY_CARE_PROVIDER_SITE_OTHER): Payer: 59 | Admitting: Physician Assistant

## 2017-11-20 ENCOUNTER — Ambulatory Visit (INDEPENDENT_AMBULATORY_CARE_PROVIDER_SITE_OTHER): Payer: 59 | Admitting: Physician Assistant

## 2017-11-20 VITALS — BP 115/79 | HR 77 | Temp 98.3°F | Ht 67.0 in | Wt 272.0 lb

## 2017-11-20 DIAGNOSIS — E559 Vitamin D deficiency, unspecified: Secondary | ICD-10-CM

## 2017-11-20 DIAGNOSIS — Z9189 Other specified personal risk factors, not elsewhere classified: Secondary | ICD-10-CM

## 2017-11-20 DIAGNOSIS — F3289 Other specified depressive episodes: Secondary | ICD-10-CM | POA: Diagnosis not present

## 2017-11-20 DIAGNOSIS — E876 Hypokalemia: Secondary | ICD-10-CM

## 2017-11-20 DIAGNOSIS — Z6841 Body Mass Index (BMI) 40.0 and over, adult: Secondary | ICD-10-CM

## 2017-11-20 DIAGNOSIS — E66813 Obesity, class 3: Secondary | ICD-10-CM

## 2017-11-20 MED ORDER — VITAMIN D (ERGOCALCIFEROL) 1.25 MG (50000 UNIT) PO CAPS: 50000.0000 [IU] | ORAL_CAPSULE | ORAL | 0 refills | Status: DC

## 2017-11-21 NOTE — Progress Notes (Signed)
Office: [phone]  /  Fax: [phone]   HPI:   Chief Complaint: OBESITY Nicole Rasmussen is here to discuss her progress with her obesity treatment plan. She is on the portion control better and make smarter food choices, such as increase vegetables and decrease simple carbohydrates and is following her eating plan approximately 30 % of the time. She states she is walking for 30 minutes 3 times per week. Nicole Rasmussen has a lot of stress going on with family and did not portion control foods. She reports being ready to get back on track.  Her weight is 272 lb (123.4 kg) today and has gained 1 pound since her last visit. She has lost 9 lbs since starting treatment with [name].  Vitamin D Deficiency Nicole Rasmussen has a diagnosis of vitamin D deficiency. She is currently taking prescription Vit D and denies nausea, vomiting or muscle weakness. Nicole Rasmussen is seeing her primary care physician next week and states she will have labs drawn there.  At risk for osteopenia and osteoporosis Nicole Rasmussen is at higher risk of osteopenia and osteoporosis due to vitamin D deficiency.   Hypokalemia Nicole Rasmussen went to the emergency room 2 weeks ago for palpitations. In the emergency room she received potassium tablets and was told to follow up with her primary care physician. She denies palpitations or chest pain today.  Depression with emotional eating behaviors Nicole Rasmussen reports stopping her Topamax due to side effects and does not want to restart. Has been on bupropion and states that it stopped working. Does not want to restart. Nicole Rasmussen struggles with emotional eating and using food for comfort to the extent that it is negatively impacting her health. She often snacks when she is not hungry. Nicole Rasmussen sometimes feels she is out of control and then feels guilty that she made poor food choices. She has been working on behavior modification techniques to help reduce her emotional eating and has been somewhat successful. She shows no sign of suicidal or  homicidal ideations.  Depression screen PHQ 2/9 02/09/2017  Decreased Interest 0  Down, Depressed, Hopeless 0  PHQ - 2 Score 0  Altered sleeping 1  Tired, decreased energy 1  Change in appetite 1  Feeling bad or failure about yourself  0  Trouble concentrating 0  Moving slowly or fidgety/restless 0  Suicidal thoughts 0  PHQ-9 Score 3  Difficult doing work/chores Not difficult at all    ALLERGIES: Allergies  Allergen Reactions  . Latex   . Shellfish Allergy Hives and Swelling  . Sulfonamide Derivatives     MEDICATIONS: Current Outpatient Medications on File Prior to Visit  Medication Sig Dispense Refill  . fexofenadine (ALLEGRA) 180 MG tablet Take 180 mg by mouth daily.    Marland Kitchen gabapentin (NEURONTIN) 300 MG capsule Take 1 capsule (300 mg total) by mouth 3 (three) times daily. Can take once daily or 2 times a day, up to 3 times a day. 90 capsule 5  . ibuprofen (ADVIL,MOTRIN) 200 MG tablet Take 200 mg by mouth every 6 (six) hours as needed.    . meloxicam (MOBIC) 15 MG tablet Take 15 mg by mouth daily.    . rosuvastatin (CRESTOR) 10 MG tablet Take 10 mg by mouth daily.    Marland Kitchen topiramate (TOPAMAX) 50 MG tablet Take 1 tablet (50 mg total) by mouth daily. 30 tablet 0   No current facility-administered medications on file prior to visit.     PAST MEDICAL HISTORY: Past Medical History:  Diagnosis Date  . Acid reflux   .  Allergy   . Anemia   . Arthritis   . Asthma   . B12 deficiency   . Dysmetabolic syndrome X   . Dyspnea   . Food allergy    shellfish  . GERD (gastroesophageal reflux disease)   . HLD (hyperlipidemia)   . Joint pain   . Lower back pain   . Nerve pain    top of right thigh  . Prediabetes   . Sinus problem   . Vitamin B 12 deficiency   . Vitamin D deficiency     PAST SURGICAL HISTORY: Past Surgical History:  Procedure Laterality Date  . CESAREAN SECTION    . FOOT SURGERY Bilateral 1980's  . MOUTH SURGERY     2011    SOCIAL HISTORY: Social  History   Tobacco Use  . Smoking status: Former Smoker    Packs/day: 1.00    Years: 20.00    Pack years: 20.00    Types: Cigarettes    Last attempt to quit: 05/02/1996    Years since quitting: 21.5  . Smokeless tobacco: Never Used  Substance Use Topics  . Alcohol use: Yes  . Drug use: No    FAMILY HISTORY: Family History  Problem Relation Age of Onset  . Cancer Mother   . Hypertension Mother   . Hyperlipidemia Mother   . Heart disease Mother   . Cancer Father   . Kidney disease Father   . Hyperlipidemia Sister   . Diabetes Sister   . Hypertension Sister   . Hyperlipidemia Brother   . Hypertension Brother     ROS: Review of Systems  Constitutional: Negative for weight loss.  Cardiovascular: Negative for chest pain and palpitations.  Gastrointestinal: Negative for nausea and vomiting.  Musculoskeletal:       Negative muscle weakness  Psychiatric/Behavioral: Positive for depression. Negative for suicidal ideas.    PHYSICAL EXAM: Blood pressure 115/79, pulse 77, temperature 98.3 F (36.8 C), temperature source Oral, height 5\' 7"  (1.702 m), weight 272 lb (123.4 kg), last menstrual period 10/11/2010, SpO2 98 %. Body mass index is 42.6 kg/m. Physical Exam  Constitutional: She is oriented to person, place, and time. She appears well-developed and well-nourished.  Cardiovascular: Normal rate.  Pulmonary/Chest: Effort normal.  Musculoskeletal: Normal range of motion.  Neurological: She is oriented to person, place, and time.  Skin: Skin is warm and dry.  Psychiatric: She has a normal mood and affect. Her behavior is normal.  Vitals reviewed.   RECENT LABS AND TESTS: BMET    Component Value Date/Time   NA 145 11/11/2017 1547   NA 139 02/09/2017 1250   K 3.1 (L) 11/11/2017 1547   CL 110 11/11/2017 1547   CO2 27 11/11/2017 1547   GLUCOSE 90 11/11/2017 1547   BUN 10 11/11/2017 1547   BUN 9 02/09/2017 1250   CREATININE 0.73 11/11/2017 1547   CALCIUM 9.1  11/11/2017 1547   GFRNONAA >60 11/11/2017 1547   GFRAA >60 11/11/2017 1547   Lab Results  Component Value Date   HGBA1C 5.9 (H) 02/09/2017   Lab Results  Component Value Date   INSULIN 9.5 02/09/2017   CBC    Component Value Date/Time   WBC 8.0 11/11/2017 1547   RBC 5.05 11/11/2017 1547   HGB 14.2 11/11/2017 1547   HGB 13.3 02/09/2017 1250   HCT 42.3 11/11/2017 1547   HCT 42.3 02/09/2017 1250   PLT 124 (L) 11/11/2017 1547   MCV 83.8 11/11/2017 1547   MCV 85  02/09/2017 1250   MCH 28.1 11/11/2017 1547   MCHC 33.6 11/11/2017 1547   RDW 14.4 11/11/2017 1547   RDW 14.7 02/09/2017 1250   LYMPHSABS 2.9 02/09/2017 1250   MONOABS 0.8 06/08/2013 2035   EOSABS 0.1 02/09/2017 1250   BASOSABS 0.0 02/09/2017 1250   Iron/TIBC/Ferritin/ %Sat No results found for: IRON, TIBC, FERRITIN, IRONPCTSAT Lipid Panel     Component Value Date/Time   CHOL 216 (H) 02/09/2017 1250   TRIG 66 02/09/2017 1250   HDL 43 02/09/2017 1250   LDLCALC 160 (H) 02/09/2017 1250   Hepatic Function Panel     Component Value Date/Time   PROT 7.0 02/09/2017 1250   ALBUMIN 4.3 02/09/2017 1250   AST 17 02/09/2017 1250   ALT 24 02/09/2017 1250   ALKPHOS 85 02/09/2017 1250   BILITOT 0.4 02/09/2017 1250      Component Value Date/Time   TSH 1.388 11/11/2017 1547   TSH 1.230 02/09/2017 1250  Results for FAYOLA, MECKES (MRN [id number]) as of 11/21/2017 09:32  Ref. Range 02/09/2017 12:50  Vitamin D, 25-Hydroxy Latest Ref Range: 30.0 - 100.0 ng/mL 21.4 (L)    ASSESSMENT AND PLAN: Vitamin D deficiency - Plan: Vitamin D, Ergocalciferol, (DRISDOL) 50000 units CAPS capsule  Hypokalemia  Other depression - with emotional eating  At risk for osteoporosis  Class 3 severe obesity with serious comorbidity and body mass index (BMI) of 40.0 to 44.9 in adult, unspecified obesity type ([place])  PLAN:  Vitamin D Deficiency Nicole Rasmussen was informed that low vitamin D levels contributes to fatigue and are associated  with obesity, breast, and colon cancer. Nicole Rasmussen agrees to continue taking prescription Vit D @50 ,000 IU every week #4 and we will refill for 1 month. She will follow up for routine testing of vitamin D, at least 2-3 times per year. She was informed of the risk of over-replacement of vitamin D and agrees to not increase her dose unless she discusses this with [name] first. [place] agrees to follow up with our clinic in 4 weeks.  At risk for osteopenia and osteoporosis Nicole Rasmussen is at risk for osteopenia and osteoporsis due to her vitamin D deficiency. She was encouraged to take her vitamin D and follow her higher calcium diet and increase strengthening exercise to help strengthen her bones and decrease her risk of osteopenia and osteoporosis.  Hypokalemia Nicole Rasmussen has an appointment with her primary care physician next week for a follow up and states she will get labs there. Nicole Rasmussen agrees to follow up with our clinic in 4 weeks.  Depression with Emotional Eating Behaviors We discussed behavior modification techniques today to help Nicole Rasmussen deal with her emotional eating and depression. Nicole Rasmussen agrees to follow up with our clinic in 4 weeks.  Obesity Nicole Rasmussen is currently in the action stage of change. As such, her goal is to continue with weight loss efforts She has agreed to portion control better and make smarter food choices, such as increase vegetables and decrease simple carbohydrates  Nicole Rasmussen has been instructed to work up to a goal of 150 minutes of combined cardio and strengthening exercise per week for weight loss and overall health benefits. We discussed the following Behavioral Modification Strategies today: increasing lean protein intake, decreasing simple carbohydrates, and keeping healthy foods in the home    Nicole Rasmussen has agreed to follow up with our clinic in 4 weeks. She was informed of the importance of frequent follow up visits to maximize her success with intensive lifestyle modifications for her multiple  health conditions.   OBESITY BEHAVIORAL INTERVENTION VISIT  Today's visit was # 11 out of 22.  Starting weight: 281 lbs Starting date: 02/09/17 Today's weight : 272 lbs  Today's date: 11/20/2017 Total lbs lost to date: 9    ASK: We discussed the diagnosis of obesity with Nicole Rasmussen today and Nicole Rasmussen agreed to give [name] permission to discuss obesity behavioral modification therapy today.  ASSESS: Nicole Rasmussen has the diagnosis of obesity and her BMI today is 42.59 Nicole Rasmussen is in the action stage of change   ADVISE: Nicole Rasmussen was educated on the multiple health risks of obesity as well as the benefit of weight loss to improve her health. She was advised of the need for long term treatment and the importance of lifestyle modifications.  AGREE: Multiple dietary modification options and treatment options were discussed and  Nicole Rasmussen agreed to the above obesity treatment plan.  Nicole Rasmussen, am acting as transcriptionist for Abby Potash, PA-C I, Abby Potash, PA-C have reviewed above note and agree with its content

## 2017-12-14 ENCOUNTER — Ambulatory Visit (AMBULATORY_SURGERY_CENTER): Payer: Self-pay

## 2017-12-14 VITALS — Ht 67.0 in | Wt 281.0 lb

## 2017-12-14 DIAGNOSIS — Z1211 Encounter for screening for malignant neoplasm of colon: Secondary | ICD-10-CM

## 2017-12-14 NOTE — Progress Notes (Signed)
Per pt, no allergies to soy or egg products.Pt not taking any weight loss meds or using  O2 at home.  Pt refused emmi video. 

## 2017-12-15 ENCOUNTER — Encounter: Payer: Self-pay | Admitting: Internal Medicine

## 2017-12-18 ENCOUNTER — Ambulatory Visit (INDEPENDENT_AMBULATORY_CARE_PROVIDER_SITE_OTHER): Payer: Self-pay | Admitting: Physician Assistant

## 2017-12-18 ENCOUNTER — Encounter (INDEPENDENT_AMBULATORY_CARE_PROVIDER_SITE_OTHER): Payer: Self-pay

## 2017-12-28 ENCOUNTER — Encounter: Payer: Self-pay | Admitting: Internal Medicine

## 2017-12-28 ENCOUNTER — Ambulatory Visit (AMBULATORY_SURGERY_CENTER): Payer: 59 | Admitting: Internal Medicine

## 2017-12-28 VITALS — BP 118/76 | HR 70 | Temp 97.3°F | Resp 10 | Ht 67.0 in | Wt 272.0 lb

## 2017-12-28 DIAGNOSIS — D120 Benign neoplasm of cecum: Secondary | ICD-10-CM | POA: Diagnosis not present

## 2017-12-28 DIAGNOSIS — Z1211 Encounter for screening for malignant neoplasm of colon: Secondary | ICD-10-CM

## 2017-12-28 MED ORDER — SODIUM CHLORIDE 0.9 % IV SOLN: 500.0000 mL | Freq: Once | INTRAVENOUS | Status: DC

## 2017-12-28 NOTE — Progress Notes (Signed)
Called to room to assist during endoscopic procedure.  Patient ID and intended procedure confirmed with present staff. Received instructions for my participation in the procedure from the performing physician.  

## 2017-12-28 NOTE — Op Note (Signed)
[facility] Patient Name: Nicole Rasmussen Procedure Date: 12/28/2017 7:54 AM MRN: [id number] Endoscopist: Gatha Mayer , MD Age: 57 Referring MD:  Date of Birth: 12-07-1960 Gender: Female Account #: [id number] Procedure:                Colonoscopy Indications:              Screening for colorectal malignant neoplasm, Last                            colonoscopy: 2009 Medicines:                Propofol per Anesthesia, Monitored Anesthesia Care Procedure:                Pre-Anesthesia Assessment:                           - Prior to the procedure, a History and Physical                            was performed, and patient medications and                            allergies were reviewed. The patient's tolerance of                            previous anesthesia was also reviewed. The risks                            and benefits of the procedure and the sedation                            options and risks were discussed with the patient.                            All questions were answered, and informed consent                            was obtained. Prior Anticoagulants: The patient has                            taken no previous anticoagulant or antiplatelet                            agents. ASA Grade Assessment: II - A patient with                            mild systemic disease. After reviewing the risks                            and benefits, the patient was deemed in                            satisfactory condition to undergo the procedure.  After obtaining informed consent, the colonoscope                            was passed under direct vision. Throughout the                            procedure, the patient's blood pressure, pulse, and                            oxygen saturations were monitored continuously. The                            Colonoscope was introduced through the anus and                            advanced to  the the cecum, identified by                            appendiceal orifice and ileocecal valve. The                            colonoscopy was performed without difficulty. The                            patient tolerated the procedure well. The quality                            of the bowel preparation was good. The ileocecal                            valve, appendiceal orifice, and rectum were                            photographed. The bowel preparation used was                            Miralax. Scope In: 8:09:57 AM Scope Out: 8:27:48 AM Scope Withdrawal Time: 0 hours 14 minutes 19 seconds  Total Procedure Duration: 0 hours 17 minutes 51 seconds  Findings:                 The perianal and digital rectal examinations were                            normal.                           A 5 mm polyp was found in the cecum. The polyp was                            flat. The polyp was removed with a cold snare.                            Resection and retrieval were complete. Verification  of patient identification for the specimen was                            done. Estimated blood loss was minimal.                           Multiple diverticula were found in the sigmoid                            colon.                           The exam was otherwise without abnormality on                            direct and retroflexion views. Complications:            No immediate complications. Estimated Blood Loss:     Estimated blood loss was minimal. Impression:               - One 5 mm polyp in the cecum, removed with a cold                            snare. Resected and retrieved.                           - Diverticulosis in the sigmoid colon.                           - The examination was otherwise normal on direct                            and retroflexion views. Recommendation:           - Patient has a contact number available for                             emergencies. The signs and symptoms of potential                            delayed complications were discussed with the                            patient. Return to normal activities tomorrow.                            Written discharge instructions were provided to the                            patient.                           - Resume previous diet.                           - Continue present medications.                           -  Repeat colonoscopy is recommended. The                            colonoscopy date will be determined after pathology                            results from today's exam become available for                            review. Gatha Mayer, MD 12/28/2017 8:34:44 AM This report has been signed electronically.

## 2017-12-28 NOTE — Patient Instructions (Addendum)
I found and removed one small polyp that looks benign.  I will let you know pathology results and when to have another routine colonoscopy by mail and/or My Chart.  You also have a condition called diverticulosis - common and not usually a problem. Please read the handout provided.  I appreciate the opportunity to care for you. Gatha Mayer, MD, FACG YOU HAD AN ENDOSCOPIC PROCEDURE TODAY AT [facility]:   Refer to the procedure report that was given to you for any specific questions about what was found during the examination.  If the procedure report does not answer your questions, please call your gastroenterologist to clarify.  If you requested that your care partner not be given the details of your procedure findings, then the procedure report has been included in a sealed envelope for you to review at your convenience later.  YOU SHOULD EXPECT: Some feelings of bloating in the abdomen. Passage of more gas than usual.  Walking can help get rid of the air that was put into your GI tract during the procedure and reduce the bloating. If you had a lower endoscopy (such as a colonoscopy or flexible sigmoidoscopy) you may notice spotting of blood in your stool or on the toilet paper. If you underwent a bowel prep for your procedure, you may not have a normal bowel movement for a few days.  Please Note:  You might notice some irritation and congestion in your nose or some drainage.  This is from the oxygen used during your procedure.  There is no need for concern and it should clear up in a day or so.  SYMPTOMS TO REPORT IMMEDIATELY:   Following lower endoscopy (colonoscopy or flexible sigmoidoscopy):  Excessive amounts of blood in the stool  Significant tenderness or worsening of abdominal pains  Swelling of the abdomen that is new, acute  Fever of 100F or higher  For urgent or emergent issues, a gastroenterologist can be reached at any hour by calling [phone]  [phone].   DIET:  We do recommend a small meal at first, but then you may proceed to your regular diet.  Drink plenty of fluids but you should avoid alcoholic beverages for 24 hours.  ACTIVITY:  You should plan to take it easy for the rest of today and you should NOT DRIVE or use heavy machinery until tomorrow (because of the sedation medicines used during the test).    FOLLOW UP: Our staff will call the number listed on your records the next business day following your procedure to check on you and address any questions or concerns that you may have regarding the information given to you following your procedure. If we do not reach you, we will leave a message.  However, if you are feeling well and you are not experiencing any problems, there is no need to return our call.  We will assume that you have returned to your regular daily activities without incident.  If any biopsies were taken you will be contacted by phone or by letter within the next 1-3 weeks.  Please call us at [phone] if you have not heard about the biopsies in 3 weeks.   Await for biopsy results to determine next repeat Colonoscopy screening Polyps (handout given) Diverticulosis (handout given)   SIGNATURES/CONFIDENTIALITY: You and/or your care partner have signed paperwork which will be entered into your electronic medical record.  These signatures attest to the fact that that the information above on your  After Visit Summary has been reviewed and is understood.  Full responsibility of the confidentiality of this discharge information lies with you and/or your care-partner.

## 2018-01-02 ENCOUNTER — Telehealth: Payer: Self-pay

## 2018-01-02 NOTE — Telephone Encounter (Signed)
  Follow up Call-  Call back number 12/28/2017  Post procedure Call Back phone  # [phone]  Permission to leave phone message Yes  Some recent data might be hidden     Patient questions:  Do you have a fever, pain , or abdominal swelling? No. Pain Score  0 *  Have you tolerated food without any problems? Yes.    Have you been able to return to your normal activities? Yes.    Do you have any questions about your discharge instructions: Diet   No. Medications  No. Follow up visit  No.  Do you have questions or concerns about your Care? No.  Actions: * If pain score is 4 or above: No action needed, pain <4.  Per pt she had cramping the day after her colonoscopy.  No problem the second day after.  Back to normal now. maw

## 2018-01-12 ENCOUNTER — Encounter: Payer: Self-pay | Admitting: Internal Medicine

## 2018-01-12 NOTE — Progress Notes (Signed)
Benign mucosa Not a polyp Recall 10 yrs 2029 My [facility]

## 2018-10-04 ENCOUNTER — Emergency Department (HOSPITAL_COMMUNITY)
Admission: EM | Admit: 2018-10-04 | Discharge: 2018-10-05 | Disposition: A | Discharge status: Home / Self Care | Payer: 59 | Attending: Emergency Medicine | Admitting: Emergency Medicine

## 2018-10-04 ENCOUNTER — Other Ambulatory Visit: Payer: Self-pay

## 2018-10-04 ENCOUNTER — Emergency Department (HOSPITAL_COMMUNITY): Payer: 59

## 2018-10-04 DIAGNOSIS — R0789 Other chest pain: Secondary | ICD-10-CM | POA: Diagnosis not present

## 2018-10-04 DIAGNOSIS — R05 Cough: Secondary | ICD-10-CM | POA: Insufficient documentation

## 2018-10-04 DIAGNOSIS — R519 Headache, unspecified: Secondary | ICD-10-CM

## 2018-10-04 DIAGNOSIS — J029 Acute pharyngitis, unspecified: Secondary | ICD-10-CM | POA: Diagnosis not present

## 2018-10-04 DIAGNOSIS — R51 Headache: Secondary | ICD-10-CM | POA: Insufficient documentation

## 2018-10-04 DIAGNOSIS — Z20828 Contact with and (suspected) exposure to other viral communicable diseases: Secondary | ICD-10-CM | POA: Diagnosis not present

## 2018-10-04 DIAGNOSIS — Z87891 Personal history of nicotine dependence: Secondary | ICD-10-CM | POA: Diagnosis not present

## 2018-10-04 MED ORDER — SODIUM CHLORIDE 0.9% FLUSH: 3.0000 mL | Freq: Once | INTRAVENOUS | Status: DC

## 2018-10-04 NOTE — ED Triage Notes (Signed)
Per pt she was dx Sunday with sinus infection and was given antibiotics and inhaler. Pt says tonight having more sob and slight chest pressure. Possible anxiety pt stated. No obvious distress pt 98% ra

## 2018-10-05 LAB — CBC
HCT: 41.1 % (ref 36.0–46.0)
Hemoglobin: 13 g/dL (ref 12.0–15.0)
MCH: 27.3 pg (ref 26.0–34.0)
MCHC: 31.6 g/dL (ref 30.0–36.0)
MCV: 86.2 fL (ref 80.0–100.0)
Platelets: UNDETERMINED 10*3/uL (ref 150–400)
RBC: 4.77 MIL/uL (ref 3.87–5.11)
RDW: 13.7 % (ref 11.5–15.5)
WBC: 10.5 10*3/uL (ref 4.0–10.5)
nRBC: 0 % (ref 0.0–0.2)

## 2018-10-05 LAB — BASIC METABOLIC PANEL
Anion gap: 11 (ref 5–15)
BUN: 12 mg/dL (ref 6–20)
CO2: 27 mmol/L (ref 22–32)
Calcium: 9.2 mg/dL (ref 8.9–10.3)
Chloride: 106 mmol/L (ref 98–111)
Creatinine, Ser: 0.8 mg/dL (ref 0.44–1.00)
GFR calc Af Amer: >60 mL/min (ref >60)
GFR calc non Af Amer: >60 mL/min (ref >60)
Glucose, Bld: 127 mg/dL — ABNORMAL HIGH (ref 70–99)
Potassium: 3.5 mmol/L (ref 3.5–5.1)
Sodium: 144 mmol/L (ref 135–145)

## 2018-10-05 LAB — TROPONIN I: Troponin I: <0.03 ng/mL (ref <0.03)

## 2018-10-05 LAB — I-STAT BETA HCG BLOOD, ED (MC, WL, AP ONLY): I-stat hCG, quantitative: <5 m[IU]/mL (ref <5)

## 2018-10-05 MED ORDER — DOXYCYCLINE HYCLATE 100 MG PO CAPS: 100.0000 mg | ORAL_CAPSULE | Freq: Two times a day (BID) | ORAL | 0 refills | Status: DC

## 2018-10-05 MED ORDER — DOXYCYCLINE HYCLATE 100 MG PO TABS
100.0000 mg | ORAL_TABLET | Freq: Once | ORAL | Status: AC
  Administered 2018-10-05: 100 mg via ORAL
  Filled 2018-10-05: qty 1

## 2018-10-05 MED ORDER — SODIUM CHLORIDE-SODIUM BICARB 2300-700 MG NA KIT: PACK | NASAL | 0 refills | Status: DC

## 2018-10-05 NOTE — ED Notes (Signed)
C/o not feeling well since Friday was seen by her PCP on Sunday was started on antibiotic for sinus infection. States she took med. For 3 days and she was feeling worse and so she stopped. C/o congestion in face and ears, c/o pain in her face.

## 2018-10-05 NOTE — ED Provider Notes (Signed)
Emergency Department Provider Note   I have reviewed the triage vital signs and the nursing notes.   HISTORY  Chief Complaint Shortness of Breath and Chest Pain   HPI Nicole Rasmussen is a 58 y.o. female with multiple medical problems who presents the emergency department today with headache.  Patient states she was diagnosed with sinusitis, recurrent, last Friday and started on Levaquin.  This medication seem too strong so she stopped taking it after 3 days and she states her symptoms get worse since then.  She has frontal headache, right sinus pain and she also has been experiencing some sore throat and chest pain associated with coughing.  Has been taking Zyrtec but no other medications.  Is never seen ear nose and throat doctor.   No other associated or modifying symptoms.    Past Medical History:  Diagnosis Date  . Acid reflux   . Allergy   . Anemia   . Arthritis   . Asthma    no inhalers  . B12 deficiency   . Dysmetabolic syndrome X   . Dyspnea   . Food allergy    shellfish  . GERD (gastroesophageal reflux disease)   . HLD (hyperlipidemia)   . Joint pain    knees and back  . Lower back pain   . Nerve pain    top of right thigh  . Neuromuscular disorder ([place])    bilateral leg pain from back issue  . Prediabetes   . Sinus problem   . Vitamin B 12 deficiency   . Vitamin D deficiency     Patient Active Problem List   Diagnosis Date Noted  . Other fatigue 02/09/2017  . Shortness of breath on exertion 02/09/2017  . Hyperglycemia 02/09/2017  . Vitamin D deficiency 02/09/2017  . Dysesthesia 01/27/2016  . 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 Lumbosacral radiculopathy at L5 01/27/2016  . Headache 06/08/2013  . Atypical chest pain 06/08/2013  . HYPERCHOLESTEROLEMIA 09/17/2007  . ARTHRITIS 09/17/2007  . HEARTBURN 09/17/2007  . CHANGE IN BOWELS 09/17/2007    Past Surgical History:  Procedure Laterality Date  .  CESAREAN SECTION     1 time  . FOOT SURGERY Bilateral 1980's   bunionectomy and hammer toe on both feet  . MOUTH SURGERY  2011   had implants/ root canal/bridges  . WISDOM TOOTH EXTRACTION     with the implants    Current Outpatient Rx  . Order #: [id number] Class: Historical Med  . Order #: [id number] Class: Historical Med  . Order #: [id number] Class: Historical Med  . Order #: [id number] Class: Normal  . Order #: [id number] Class: Historical Med  . Order #: [id number] Class: Normal  . Order #: [id number] Class: Historical Med  . Order #: [id number] Class: Historical Med  . Order #: [id number] Class: Historical Med  . Order #: [id number] Class: Normal  . Order #: [id number] Class: Normal  . Order #: [id number] Class: Normal    Allergies Shellfish allergy; Latex; and Sulfonamide derivatives  Family History  Problem Relation Age of Onset  . Cancer Mother   . Hypertension Mother   . Hyperlipidemia Mother   . Heart disease Mother   . Breast cancer Mother   . Cancer Father   . Kidney disease Father   . Prostate cancer Father   . Hyperlipidemia Sister   . Diabetes Sister   . Hypertension Sister   . Hyperlipidemia Brother   . Hypertension Brother   . Prostate cancer Brother   . Hypertension Sister   .  Hypertension Sister   . Hypertension Sister   . Hypertension Brother   . Hypertension Brother   . Hypertension Brother     Social History Social History   Tobacco Use  . Smoking status: Former Smoker    Packs/day: 1.00    Years: 20.00    Pack years: 20.00    Types: Cigarettes    Last attempt to quit: 05/02/1996    Years since quitting: 22.4  . Smokeless tobacco: Never Used  Substance Use Topics  . Alcohol use: Yes    Comment: social  . Drug use: No    Review of Systems  All other systems negative except as documented in the HPI. All pertinent positives and negatives as reviewed in the HPI. ____________________________________________   PHYSICAL EXAM:  VITAL SIGNS: ED  Triage Vitals  Enc Vitals Group     BP 10/04/18 2318 133/77     Pulse Rate 10/04/18 2318 86     Resp 10/04/18 2318 19     Temp 10/04/18 2318 98.3 F (36.8 C)     Temp Source 10/04/18 2318 Oral     SpO2 10/04/18 2318 99 %    Constitutional: Alert and oriented. Well appearing and in no acute distress. Eyes: Conjunctivae are normal. PERRL. EOMI. Head: Atraumatic. Nose: R>L narrowing, bilateral mucousy rhinorrhea Mouth/Throat: Mucous membranes are moist.  Oropharynx non-erythematous. Neck: No stridor.  No meningeal signs.   Cardiovascular: Normal rate, regular rhythm. Good peripheral circulation. Grossly normal heart sounds.   Respiratory: Normal respiratory effort.  No retractions. Lungs CTAB. Gastrointestinal: Soft and nontender. No distention.  Musculoskeletal: No lower extremity tenderness nor edema. No gross deformities of extremities. Neurologic:  Normal speech and language. No gross focal neurologic deficits are appreciated.  Skin:  Skin is warm, dry and intact. No rash noted.   ____________________________________________   LABS (all labs ordered are listed, but only abnormal results are displayed)  Labs Reviewed  BASIC METABOLIC PANEL - Abnormal; Notable for the following components:      Result Value   Glucose, Bld 127 (*)    All other components within normal limits  NOVEL CORONAVIRUS, NAA (HOSPITAL ORDER, SEND-OUT TO REF LAB)  CBC  TROPONIN I  I-STAT BETA HCG BLOOD, ED ([facility], [facility], [facility] ONLY)   ____________________________________________  EKG   EKG Interpretation  Date/Time:  Friday October 05 2018 02:57:19 EDT Ventricular Rate:  70 PR Interval:    QRS Duration: 99 QT Interval:  401 QTC Calculation: 433 R Axis:   20 Text Interpretation:  Sinus rhythm Borderline prolonged PR interval Low voltage, precordial leads No significant change since last tracing Confirmed by Merrily Pew [phone]) on 10/05/2018 3:53:56 AM       ____________________________________________   RADIOLOGY  Dg Chest 2 View  Result Date: 10/04/2018 CLINICAL DATA:  Shortness of breath EXAM: CHEST - 2 VIEW COMPARISON:  11/11/2017 FINDINGS: The heart size and mediastinal contours are within normal limits. Both lungs are clear. The visualized skeletal structures are unremarkable. IMPRESSION: No active cardiopulmonary disease. Electronically Signed   By: Constance Holster M.D.   On: 10/04/2018 23:59    ____________________________________________   PROCEDURES  Procedure(s) performed:   Procedures   ____________________________________________   INITIAL IMPRESSION / ASSESSMENT AND PLAN / ED COURSE  Likely sinus headache with sinusitis and PND. Switch to doxycycline. Add on neti pots. Symptomatic care at home otherwise. ENT follow up .  Pertinent labs & imaging results that were available during my care of the patient were reviewed by  me and considered in my medical decision making (see chart for details).  A medical screening exam was performed and I feel the patient has had an appropriate workup for their chief complaint at this time and likelihood of emergent condition existing is low. They have been counseled on decision, discharge, follow up and which symptoms necessitate immediate return to the emergency department. They or their family verbally stated understanding and agreement with plan and discharged in stable condition.   ____________________________________________  FINAL CLINICAL IMPRESSION(S) / ED DIAGNOSES  Final diagnoses:  Nonintractable headache, unspecified chronicity pattern, unspecified headache type     MEDICATIONS GIVEN DURING THIS VISIT:  Medications  sodium chloride flush (NS) 0.9 % injection 3 mL (has no administration in time range)  doxycycline (VIBRA-TABS) tablet 100 mg (has no administration in time range)     NEW OUTPATIENT MEDICATIONS STARTED DURING THIS VISIT:  New Prescriptions   DOXYCYCLINE (VIBRAMYCIN) 100 MG CAPSULE    Take 1  capsule (100 mg total) by mouth 2 (two) times daily. One po bid x 7 days   SODIUM CHLORIDE-SODIUM BICARB (NETI POT SINUS [place]) 2300-700 MG KIT    One daily as needed for congestion    Note:  This note was prepared with assistance of Dragon voice recognition software. Occasional wrong-word or sound-a-like substitutions may have occurred due to the inherent limitations of voice recognition software.   Naod Sweetland, [name] Cornea, MD 10/05/18 [phone]

## 2018-10-06 LAB — NOVEL CORONAVIRUS, NAA (HOSP ORDER, SEND-OUT TO REF LAB; TAT 18-24 HRS): SARS-CoV-2, NAA: NOT DETECTED

## 2018-10-15 ENCOUNTER — Telehealth (HOSPITAL_COMMUNITY): Payer: Self-pay

## 2018-10-19 DIAGNOSIS — J329 Chronic sinusitis, unspecified: Secondary | ICD-10-CM | POA: Insufficient documentation

## 2018-11-08 ENCOUNTER — Encounter: Payer: Self-pay | Admitting: Pulmonary Disease

## 2018-11-08 ENCOUNTER — Other Ambulatory Visit: Payer: Self-pay

## 2018-11-08 ENCOUNTER — Ambulatory Visit: Payer: 59 | Admitting: Pulmonary Disease

## 2018-11-08 VITALS — BP 136/80 | HR 83 | Temp 98.9°F | Ht 68.0 in | Wt 284.8 lb

## 2018-11-08 DIAGNOSIS — R9389 Abnormal findings on diagnostic imaging of other specified body structures: Secondary | ICD-10-CM | POA: Diagnosis not present

## 2018-11-08 NOTE — Patient Instructions (Signed)
CT scans revealing 9 mm right lung nodule Was present on previous CT of about a year ago Characteristics remain about the same  We will obtain a PET scan  If PET scan does not reveal significant activity in this area We will follow-up with CT scans to ensure stability  Use albuterol as needed for your asthma Avoid known triggers  We will see you in about 3 months

## 2018-11-08 NOTE — Addendum Note (Signed)
Addended by: [name] Prince on: 11/08/2018 11:29 AM   Modules accepted: Orders

## 2018-11-08 NOTE — Progress Notes (Signed)
Subjective:     Patient ID: Nicole Rasmussen, female   DOB: 1960/10/04, 58 y.o.   MRN: [id number]  Patient being seen for an abnormal CT scan of the chest  Patient had a recent CT scan at [facility]  She was being treated for a sinus infection I believe in reviewing her records there was an abnormal CT scan noted from 2019 She had a repeat CT CT reveals a 9 mm right upper lobe nodule  The nodule has not changed from the previous CT performed about a year earlier  She is a reformed smoker  No personal history of cancer No family history of lung cancer  No pertinent occupational history No increased risk profile for lung cancer  Her sister was on a video call during this visit  She has a history of asthma Has not needed to use any inhalers in the last couple years She used inhalers during the episode of sinus infection Has not needed to use it in about a week    Review of Systems  Constitutional: Negative.   HENT: Positive for sinus pressure.   Respiratory: Positive for cough, shortness of breath and wheezing.   Cardiovascular: Negative.   Gastrointestinal: Negative.   Endocrine: Negative.   Genitourinary: Negative.   Musculoskeletal: Positive for back pain.  Skin: Negative.   Allergic/Immunologic: Positive for environmental allergies and food allergies.  Neurological: Positive for headaches.  Hematological: Negative.   Psychiatric/Behavioral: Negative.    Past Medical History:  Diagnosis Date  . Acid reflux   . Allergy   . Anemia   . Arthritis   . Asthma    no inhalers  . B12 deficiency   . Dysmetabolic syndrome X   . Dyspnea   . Food allergy    shellfish  . GERD (gastroesophageal reflux disease)   . HLD (hyperlipidemia)   . Joint pain    knees and back  . Lower back pain   . Nerve pain    top of right thigh  . Neuromuscular disorder ([place])    bilateral leg pain from back issue  . Prediabetes   . Sinus problem   . Vitamin B 12  deficiency   . Vitamin D deficiency    Social History   Socioeconomic History  . Marital status: Married    Spouse name: Izell [facility] [facility]  . Number of children: 1  . Years of education: BA  . Highest education level: Not on file  Occupational History  . Occupation: Retired, Engineer, maintenance: [employer]  . Financial resource strain: Not on file  . Food insecurity    Worry: Not on file    Inability: Not on file  . Transportation needs    Medical: Not on file    Non-medical: Not on file  Tobacco Use  . Smoking status: Former Smoker    Packs/day: 1.00    Years: 20.00    Pack years: 20.00    Types: Cigarettes    Quit date: 05/02/1996    Years since quitting: 22.5  . Smokeless tobacco: Never Used  Substance and Sexual Activity  . Alcohol use: Yes    Comment: social  . Drug use: No  . Sexual activity: Not on file  Lifestyle  . Physical activity    Days per week: Not on file    Minutes per session: Not on file  . Stress: Not on file  Relationships  . Social connections    Talks  on phone: Not on file    Gets together: Not on file    Attends religious service: Not on file    Active member of club or organization: Not on file    Attends meetings of clubs or organizations: Not on file    Relationship status: Not on file  . Intimate partner violence    Fear of current or ex partner: Not on file    Emotionally abused: Not on file    Physically abused: Not on file    Forced sexual activity: Not on file  Other Topics Concern  . Not on file  Social History Narrative   Occasional caffeine use    Family History  Problem Relation Age of Onset  . Cancer Mother   . Hypertension Mother   . Hyperlipidemia Mother   . Heart disease Mother   . Breast cancer Mother   . Cancer Father   . Kidney disease Father   . Prostate cancer Father   . Hyperlipidemia Sister   . Diabetes Sister   . Hypertension Sister   . Hyperlipidemia Brother   . Hypertension Brother    . Prostate cancer Brother   . Hypertension Sister   . Hypertension Sister   . Hypertension Sister   . Hypertension Brother   . Hypertension Brother   . Hypertension Brother       Objective:   Physical Exam Constitutional:      Appearance: Normal appearance.  HENT:     Head: Normocephalic and atraumatic.  Eyes:     General:        Right eye: No discharge.        Left eye: No discharge.  Neck:     Musculoskeletal: Normal range of motion. No neck rigidity or muscular tenderness.  Cardiovascular:     Rate and Rhythm: Normal rate and regular rhythm.     Heart sounds: No murmur.  Pulmonary:     Effort: Pulmonary effort is normal. No respiratory distress.     Breath sounds: Normal breath sounds. No stridor. No wheezing or rhonchi.  Abdominal:     General: There is no distension.     Tenderness: There is no abdominal tenderness.  Musculoskeletal: Normal range of motion.        General: No swelling or tenderness.  Skin:    General: Skin is warm.     Coloration: Skin is not jaundiced or pale.  Neurological:     General: No focal deficit present.     Mental Status: She is alert.  Psychiatric:        Mood and Affect: Mood normal.    CT scan from July 2019 reviewed with the patient CT scan from June 2020 reviewed by myself Nodule remains about 9 mm    Assessment:     Abnormal CT scan of the chest showing a 9 mm right lung nodule -Only concern is because of a poorly defined margins of the lesion -Unchanged compared to a CT performed a year ago -Reformed smoker -Denies any other symptoms at present  History of asthma -Recent sinus infection -Recent use of rescue inhalers  Obesity    Plan:     We will obtain a PET scan to assess activity in this nodule  If PET scan is negative then will repeat CT in a year  Encouraged to use rescue inhalers as needed  I will see her back in the office in about 3 months Encouraged to call if any significant  concerns  Possibility of requiring a biopsy versus excision discussed-this will depend on PET scan findings

## 2018-11-14 ENCOUNTER — Encounter (HOSPITAL_COMMUNITY)
Admission: RE | Admit: 2018-11-14 | Discharge: 2018-11-14 | Disposition: A | Discharge status: Home / Self Care | Payer: 59 | Source: Ambulatory Visit | Attending: Pulmonary Disease | Admitting: Pulmonary Disease

## 2018-11-14 ENCOUNTER — Other Ambulatory Visit: Payer: Self-pay

## 2018-11-14 DIAGNOSIS — R9389 Abnormal findings on diagnostic imaging of other specified body structures: Secondary | ICD-10-CM | POA: Insufficient documentation

## 2018-11-14 LAB — GLUCOSE, CAPILLARY: Glucose-Capillary: 87 mg/dL (ref 70–99)

## 2018-11-14 MED ORDER — FLUDEOXYGLUCOSE F - 18 (FDG) INJECTION
13.4000 | Freq: Once | INTRAVENOUS | Status: AC | PRN
  Administered 2018-11-14: 13.4 via INTRAVENOUS

## 2018-11-22 ENCOUNTER — Telehealth: Payer: Self-pay | Admitting: Pulmonary Disease

## 2018-11-22 NOTE — Telephone Encounter (Signed)
PET scan showed: Right upper lobe 0.9 cm pulmonary nodule likely benign. Suggest chest CT follow-up in 6 months.  Also showed two nonenlarged hypermetabolic right inguinal lymph nodes, nonspecific. Suggest follow-up CT abdomen/pelvis with oral and IV contrast in 3-6 months to document stability.  If patient has any questions please route to Dr. Ander Slade. He will be back on Monday. Thanks.

## 2018-11-22 NOTE — Telephone Encounter (Signed)
LMTCB. Left message per patient request with results. Nothing further is needed at this time.

## 2018-11-22 NOTE — Telephone Encounter (Signed)
Called and spoke with Patient. Patient requested results to her PET scan, 11/14/18.  Patient stated she was ok with someone leaving results on her VM.  Dr Ander Slade is currently at the hospital. Will route message to [facility] (provider of the day)

## 2018-11-28 ENCOUNTER — Emergency Department (HOSPITAL_COMMUNITY): Payer: 59

## 2018-11-28 ENCOUNTER — Emergency Department (HOSPITAL_COMMUNITY)
Admission: EM | Admit: 2018-11-28 | Discharge: 2018-11-28 | Disposition: A | Discharge status: Home / Self Care | Payer: 59 | Attending: Emergency Medicine | Admitting: Emergency Medicine

## 2018-11-28 ENCOUNTER — Encounter (HOSPITAL_COMMUNITY): Payer: Self-pay

## 2018-11-28 ENCOUNTER — Other Ambulatory Visit: Payer: Self-pay

## 2018-11-28 DIAGNOSIS — Z87891 Personal history of nicotine dependence: Secondary | ICD-10-CM | POA: Diagnosis not present

## 2018-11-28 DIAGNOSIS — Z91040 Latex allergy status: Secondary | ICD-10-CM | POA: Diagnosis not present

## 2018-11-28 DIAGNOSIS — Z79899 Other long term (current) drug therapy: Secondary | ICD-10-CM | POA: Insufficient documentation

## 2018-11-28 DIAGNOSIS — J45909 Unspecified asthma, uncomplicated: Secondary | ICD-10-CM | POA: Insufficient documentation

## 2018-11-28 DIAGNOSIS — Z7982 Long term (current) use of aspirin: Secondary | ICD-10-CM | POA: Insufficient documentation

## 2018-11-28 DIAGNOSIS — R58 Hemorrhage, not elsewhere classified: Secondary | ICD-10-CM | POA: Diagnosis not present

## 2018-11-28 DIAGNOSIS — K068 Other specified disorders of gingiva and edentulous alveolar ridge: Secondary | ICD-10-CM

## 2018-11-28 DIAGNOSIS — R51 Headache: Secondary | ICD-10-CM | POA: Insufficient documentation

## 2018-11-28 DIAGNOSIS — R519 Headache, unspecified: Secondary | ICD-10-CM

## 2018-11-28 LAB — CBC WITH DIFFERENTIAL/PLATELET
Abs Immature Granulocytes: 0.02 10*3/uL (ref 0.00–0.07)
Basophils Absolute: 0 10*3/uL (ref 0.0–0.1)
Basophils Relative: 1 %
Eosinophils Absolute: 0.1 10*3/uL (ref 0.0–0.5)
Eosinophils Relative: 2 %
HCT: 43.2 % (ref 36.0–46.0)
Hemoglobin: 14 g/dL (ref 12.0–15.0)
Immature Granulocytes: 0 %
Lymphocytes Relative: 32 %
Lymphs Abs: 2.1 10*3/uL (ref 0.7–4.0)
MCH: 27.6 pg (ref 26.0–34.0)
MCHC: 32.4 g/dL (ref 30.0–36.0)
MCV: 85 fL (ref 80.0–100.0)
Monocytes Absolute: 0.5 10*3/uL (ref 0.1–1.0)
Monocytes Relative: 8 %
Neutro Abs: 3.7 10*3/uL (ref 1.7–7.7)
Neutrophils Relative %: 57 %
RBC: 5.08 MIL/uL (ref 3.87–5.11)
RDW: 13.2 % (ref 11.5–15.5)
WBC: 6.5 10*3/uL (ref 4.0–10.5)
nRBC: 0 % (ref 0.0–0.2)

## 2018-11-28 LAB — BASIC METABOLIC PANEL
Anion gap: 9 (ref 5–15)
BUN: 8 mg/dL (ref 6–20)
CO2: 24 mmol/L (ref 22–32)
Calcium: 9.1 mg/dL (ref 8.9–10.3)
Chloride: 107 mmol/L (ref 98–111)
Creatinine, Ser: 0.64 mg/dL (ref 0.44–1.00)
GFR calc Af Amer: >60 mL/min (ref >60)
GFR calc non Af Amer: >60 mL/min (ref >60)
Glucose, Bld: 101 mg/dL — ABNORMAL HIGH (ref 70–99)
Potassium: 3.6 mmol/L (ref 3.5–5.1)
Sodium: 140 mmol/L (ref 135–145)

## 2018-11-28 MED ORDER — DIPHENHYDRAMINE HCL 25 MG PO CAPS
25.0000 mg | ORAL_CAPSULE | Freq: Once | ORAL | Status: AC
  Administered 2018-11-28: 25 mg via ORAL
  Filled 2018-11-28: qty 1

## 2018-11-28 MED ORDER — METOCLOPRAMIDE HCL 10 MG PO TABS
10.0000 mg | ORAL_TABLET | Freq: Once | ORAL | Status: AC
  Administered 2018-11-28: 10 mg via ORAL
  Filled 2018-11-28: qty 1

## 2018-11-28 MED ORDER — DEXAMETHASONE 4 MG PO TABS
4.0000 mg | ORAL_TABLET | Freq: Once | ORAL | Status: AC
  Administered 2018-11-28: 4 mg via ORAL
  Filled 2018-11-28: qty 1

## 2018-11-28 NOTE — ED Notes (Signed)
Patient transported to X-ray 

## 2018-11-28 NOTE — Discharge Instructions (Signed)
You have been seen today for headache and bleeding. Please read and follow all provided instructions. Return to the emergency room for worsening condition or new concerning symptoms.    Your blood pressure was elevated today when you came in.  Recommend you have it rechecked within 1 week by primary care doctor.  1. Medications:  Continue usual home medications.  You can take Tylenol for your headache as needed.  Please take as directed on the bottle.  2. Treatment: rest, drink plenty of fluids  3. Follow Up:  -Please follow-up with your pulmonologist as we discussed.  -Please follow up with your primary doctor in 2-5 days for discussion of your diagnoses and further evaluation after today's visit; Call today to arrange your follow up.    ?

## 2018-11-28 NOTE — ED Triage Notes (Signed)
Patient complains of frontal and occipital headache intermittently for days. Reports increased stress due to spouse being hospitalized. This am noticed blood after brushing teeth and rinsing. Patient alert and oriented, states headache frontal and occipital, denies trauma

## 2018-11-28 NOTE — ED Provider Notes (Signed)
[place] EMERGENCY DEPARTMENT Provider Note   CSN: [id number] Arrival date & time: 11/28/18  7616    History   Chief Complaint Chief Complaint  Patient presents with  . headache/ stress    HPI Nicole Rasmussen is a 58 y.o. female with past medical history of anemia, asthma, GERd, back pain presents to emergency department today with chief complaint of headache and oral bleeding.  She states headache x2 days. She is under a lot of stress lately as her husband is hospitalized and s/p amputation,their only child is going away to college, and waiting fo results of recent PET scan for pulmonary nodule. Headaches feels like previous ones. Pain is located across back of her head and is throbbing. She rates pain 7 out of 10 in severity. Denies sudden onset or radiation of pain. She did not take anything for pain prior to arrival.   She also reports bleeding in her mouth when she was brushing her teeth this morning. She is unable to tell if it was coming from her gums or throat. She denies any associated pain. Denies history of similar. Also denies fever, syncope, head trauma, photophobia, phonophobia, UL throbbing pain, N/V, visual changes, stiff neck, neck pain, rash, seizure, jaw claudication, difficulty breathing, shortness of breath.History provided by patient with additional history obtained from chart review.       Past Medical History:  Diagnosis Date  . Acid reflux   . Allergy   . Anemia   . Arthritis   . Asthma    no inhalers  . B12 deficiency   . Dysmetabolic syndrome X   . Dyspnea   . Food allergy    shellfish  . GERD (gastroesophageal reflux disease)   . HLD (hyperlipidemia)   . Joint pain    knees and back  . Lower back pain   . Nerve pain    top of right thigh  . Neuromuscular disorder ([place])    bilateral leg pain from back issue  . Prediabetes   . Sinus problem   . Vitamin B 12 deficiency   . Vitamin D deficiency     Patient Active  Problem List   Diagnosis Date Noted  . Other fatigue 02/09/2017  . Shortness of breath on exertion 02/09/2017  . Hyperglycemia 02/09/2017  . Vitamin D deficiency 02/09/2017  . Dysesthesia 01/27/2016  . 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 Lumbosacral radiculopathy at L5 01/27/2016  . Headache 06/08/2013  . Atypical chest pain 06/08/2013  . HYPERCHOLESTEROLEMIA 09/17/2007  . ARTHRITIS 09/17/2007  . HEARTBURN 09/17/2007  . CHANGE IN BOWELS 09/17/2007    Past Surgical History:  Procedure Laterality Date  . CESAREAN SECTION     1 time  . FOOT SURGERY Bilateral 1980's   bunionectomy and hammer toe on both feet  . MOUTH SURGERY  2011   had implants/ root canal/bridges  . WISDOM TOOTH EXTRACTION     with the implants     OB History    Gravida  4   Para      Term      Preterm      AB  3   Living  1     SAB  3   TAB      Ectopic      Multiple      Live Births               Home Medications    Prior to  Admission medications   Medication Sig Start Date End Date Taking? Authorizing Provider  aspirin EC 81 MG tablet Take 81 mg by mouth daily.    [provider]  cetirizine (ZYRTEC) 10 MG tablet Take 10 mg by mouth as needed for allergies.    [provider]  cyclobenzaprine (FLEXERIL) 10 MG tablet Take 10 mg by mouth 3 (three) times daily as needed for muscle spasms.    [provider]  fexofenadine (ALLEGRA) 180 MG tablet Take 180 mg by mouth as needed.     [provider]  fluticasone (FLONASE) 50 MCG/ACT nasal spray Place into both nostrils daily.    [provider]  gabapentin (NEURONTIN) 300 MG capsule Take 1 capsule (300 mg total) by mouth 3 (three) times daily. Can take once daily or 2 times a day, up to 3 times a day. Patient taking differently: Take 300 mg by mouth as needed. Can take once daily or 2 times a day, up to 3 times a day. 04/18/16   Star Age, MD   ibuprofen (ADVIL,MOTRIN) 200 MG tablet Take 200 mg by mouth every 6 (six) hours as needed.    [provider]  meloxicam (MOBIC) 15 MG tablet Take 15 mg by mouth as needed.     [provider]  rosuvastatin (CRESTOR) 10 MG tablet Take 10 mg by mouth daily.    [provider]  topiramate (TOPAMAX) 50 MG tablet Take 1 tablet (50 mg total) by mouth daily. Patient not taking: Reported on 11/08/2018 10/18/17   Waldon Merl, PA-C  Vitamin D, Ergocalciferol, (DRISDOL) 50000 units CAPS capsule Take 1 capsule (50,000 Units total) by mouth every 7 (seven) days. 11/20/17   Abby Potash, PA-C    Family History Family History  Problem Relation Age of Onset  . Cancer Mother   . Hypertension Mother   . Hyperlipidemia Mother   . Heart disease Mother   . Breast cancer Mother   . Cancer Father   . Kidney disease Father   . Prostate cancer Father   . Hyperlipidemia Sister   . Diabetes Sister   . Hypertension Sister   . Hyperlipidemia Brother   . Hypertension Brother   . Prostate cancer Brother   . Hypertension Sister   . Hypertension Sister   . Hypertension Sister   . Hypertension Brother   . Hypertension Brother   . Hypertension Brother     Social History Social History   Tobacco Use  . Smoking status: Former Smoker    Packs/day: 1.00    Years: 20.00    Pack years: 20.00    Types: Cigarettes    Quit date: 05/02/1996    Years since quitting: 22.5  . Smokeless tobacco: Never Used  Substance Use Topics  . Alcohol use: Yes    Comment: social  . Drug use: No     Allergies   Shellfish allergy, Latex, and Sulfonamide derivatives   Review of Systems Review of Systems  Constitutional: Negative for chills and fever.  HENT: Negative for dental problem, facial swelling, mouth sores, sore throat and trouble swallowing.   Eyes: Negative for pain and visual disturbance.  Gastrointestinal: Negative for abdominal pain, diarrhea, nausea and vomiting.   Musculoskeletal: Negative for neck pain.  Skin: Negative for rash and wound.  Neurological: Positive for headaches. Negative for dizziness, syncope, facial asymmetry, weakness, light-headedness and numbness.     Physical Exam Updated Vital Signs BP 128/85 (BP Location: Right Arm)   Pulse  72   Temp 98.3 F (36.8 C) (Oral)   Resp 16   LMP 10/11/2010   SpO2 95%   Physical Exam Vitals signs and nursing note reviewed.  Constitutional:      Appearance: She is well-developed. She is not ill-appearing or toxic-appearing.  HENT:     Head: Normocephalic and atraumatic.     Comments: No sinus or temporal tenderness.    Nose: Nose normal.     Mouth/Throat:     Lips: Pink. No lesions.     Mouth: Mucous membranes are moist. No oral lesions.     Dentition: No dental tenderness, gingival swelling or gum lesions.     Tongue: No lesions.     Pharynx: Oropharynx is clear. Uvula midline. No pharyngeal swelling or posterior oropharyngeal erythema.  Eyes:     General: No scleral icterus.       Right eye: No discharge.        Left eye: No discharge.     Conjunctiva/sclera: Conjunctivae normal.  Neck:     Musculoskeletal: Normal range of motion.     Thyroid: No thyromegaly.     Vascular: No JVD.     Trachea: Trachea normal.  Cardiovascular:     Rate and Rhythm: Normal rate and regular rhythm.     Pulses: Normal pulses.     Heart sounds: Normal heart sounds.  Pulmonary:     Effort: Pulmonary effort is normal.     Breath sounds: Normal breath sounds.  Abdominal:     General: There is no distension.  Musculoskeletal: Normal range of motion.  Lymphadenopathy:     Cervical: No cervical adenopathy.  Skin:    General: Skin is warm and dry.  Neurological:     Mental Status: She is oriented to person, place, and time.     GCS: GCS eye subscore is 4. GCS verbal subscore is 5. GCS motor subscore is 6.     Comments: Speech is clear and goal oriented, follows commands CN III-XII intact, no  facial droop Normal strength in upper and lower extremities bilaterally including dorsiflexion and plantar flexion, strong and equal grip strength Sensation normal to light and sharp touch Moves extremities without ataxia, coordination intact Normal finger to nose and rapid alternating movements Normal gait and balance   Psychiatric:        Mood and Affect: Affect is tearful.        Behavior: Behavior normal.      ED Treatments / Results  Labs (all labs ordered are listed, but only abnormal results are displayed) Labs Reviewed  BASIC METABOLIC PANEL - Abnormal; Notable for the following components:      Result Value   Glucose, Bld 101 (*)    All other components within normal limits  CBC WITH DIFFERENTIAL/PLATELET    EKG None  Radiology Dg Chest 2 View  Result Date: 11/28/2018 CLINICAL DATA:  Headaches.  Coughing. EXAM: CHEST - 2 VIEW COMPARISON:  Chest x-ray 10/04/2018 FINDINGS: The cardiac silhouette, mediastinal and hilar contours are normal and stable. The lungs are clear. No pulmonary lesions. The bony thorax is intact. IMPRESSION: No acute cardiopulmonary findings. Electronically Signed   By: Marijo Sanes M.D.   On: 11/28/2018 12:07    Procedures Procedures (including critical care time)  Medications Ordered in ED Medications  dexamethasone (DECADRON) tablet 4 mg (4 mg Oral Given 11/28/18 1133)  diphenhydrAMINE (BENADRYL) capsule 25 mg (25 mg Oral Given 11/28/18 1133)  metoCLOPramide (REGLAN) tablet 10 mg (10  mg Oral Given 11/28/18 1133)     Initial Impression / Assessment and Plan / ED Course  I have reviewed the triage vital signs and the nursing notes.  Pertinent labs & imaging results that were available during my care of the patient were reviewed by me and considered in my medical decision making (see chart for details).  Pt HA treated with PO medications and improved while in ED.  Presentation is like pts typical HA and non concerning for [facility], [facility],  Meningitis, or temporal arteritis. Pt is afebrile with no focal neuro deficits, nuchal rigidity, or change in vision.   On exam no signs of oral mucosal bleeding. Posterior oropharynx is clear, no edema or bleeding noted. CBC with platelet clumping, pt reports history of same and has been evaluated for it. Hemoglobin stable at 14. BMP unremarkable. Chest xray viewed by me without infiltrate, unremarkable. Blood pressure improved on recheck with once pain improved.  Patient is hemodynamically stable, in NAD, and able to ambulate in the ED. Evaluation does not show pathology that would require ongoing emergent intervention or inpatient treatment. I explained the diagnosis to the patient. Pain has been managed and has no complaints prior to discharge. Patient is comfortable with above plan and is stable for discharge at this time. All questions were answered prior to disposition. Strict return precautions for returning to the ED were discussed. Encouraged follow up with PCP.   This note was prepared using Dragon voice recognition software and may include unintentional dictation errors due to the inherent limitations of voice recognition software.    Final Clinical Impressions(s) / ED Diagnoses   Final diagnoses:  Nonintractable headache, unspecified chronicity pattern, unspecified headache type  Bleeding gums    ED Discharge Orders    None       Cherre Robins, PA-C 11/29/18 [id number]    Malvin Johns, MD 11/29/18 [phone]

## 2019-01-30 ENCOUNTER — Ambulatory Visit: Payer: 59 | Admitting: Pulmonary Disease

## 2019-02-07 ENCOUNTER — Ambulatory Visit: Payer: 59 | Admitting: Pulmonary Disease

## 2019-02-19 ENCOUNTER — Encounter: Payer: Self-pay | Admitting: Pulmonary Disease

## 2019-02-19 ENCOUNTER — Ambulatory Visit: Payer: 59 | Admitting: Pulmonary Disease

## 2019-02-19 ENCOUNTER — Other Ambulatory Visit: Payer: Self-pay

## 2019-02-19 DIAGNOSIS — R918 Other nonspecific abnormal finding of lung field: Secondary | ICD-10-CM

## 2019-02-19 NOTE — Patient Instructions (Signed)
No significant symptoms since her last visit Your PET/CT scan as we reviewed  We will repeat your CT scan in June 2021 I will see you after your CT scan  Call with significant concerns  Avoid known triggers for your asthma

## 2019-02-19 NOTE — Progress Notes (Signed)
Subjective:     Patient ID: Nicole Rasmussen, female   DOB: 04-10-1961, 58 y.o.   MRN: [id number]  Patient being seen for an abnormal CT scan of the chest  Patient had a recent CT scan [facility] She did have a PET scan performed-no significant activity in the lung nodule There was some activity in the groin-she just had a repeat CT scan of the abdomen and pelvis-results not available to me  Asthma has been well controlled Rarely needs albuterol  CT reveals a 9 mm right upper lobe nodule Nodule size remains unchanged on the PET scan  She is a reformed smoker  No personal history of cancer No family history of lung cancer  No pertinent occupational history No increased risk profile for lung cancer    She has a history of asthma Has not needed to use any inhalers apart from albuterol which she uses rarely    Review of Systems  Constitutional: Negative.   HENT: Negative for sinus pressure.   Respiratory: Negative for cough, shortness of breath and wheezing.   Cardiovascular: Negative.   Gastrointestinal: Negative.   Endocrine: Negative.   Genitourinary: Negative.   Musculoskeletal: Negative for back pain.  Skin: Negative.   Allergic/Immunologic: Negative for environmental allergies and food allergies.  Neurological: Negative for headaches.  Hematological: Negative.   Psychiatric/Behavioral: Negative.    Past Medical History:  Diagnosis Date  . Acid reflux   . Allergy   . Anemia   . Arthritis   . Asthma    no inhalers  . B12 deficiency   . Dysmetabolic syndrome X   . Dyspnea   . Food allergy    shellfish  . GERD (gastroesophageal reflux disease)   . HLD (hyperlipidemia)   . Joint pain    knees and back  . Lower back pain   . Nerve pain    top of right thigh  . Neuromuscular disorder ([place])    bilateral leg pain from back issue  . Prediabetes   . Sinus problem   . Vitamin B 12 deficiency   . Vitamin D deficiency    Social History    Socioeconomic History  . Marital status: Married    Spouse name: Izell [place]  . Number of children: 1  . Years of education: BA  . Highest education level: Not on file  Occupational History  . Occupation: Retired, Engineer, maintenance: [employer]  . Financial resource strain: Not on file  . Food insecurity    Worry: Not on file    Inability: Not on file  . Transportation needs    Medical: Not on file    Non-medical: Not on file  Tobacco Use  . Smoking status: Former Smoker    Packs/day: 1.00    Years: 20.00    Pack years: 20.00    Types: Cigarettes    Quit date: 05/02/1996    Years since quitting: 22.8  . Smokeless tobacco: Never Used  Substance and Sexual Activity  . Alcohol use: Yes    Comment: social  . Drug use: No  . Sexual activity: Not on file  Lifestyle  . Physical activity    Days per week: Not on file    Minutes per session: Not on file  . Stress: Not on file  Relationships  . Social Herbalist on phone: Not on file    Gets together: Not on file    Attends  religious service: Not on file    Active member of club or organization: Not on file    Attends meetings of clubs or organizations: Not on file    Relationship status: Not on file  . Intimate partner violence    Fear of current or ex partner: Not on file    Emotionally abused: Not on file    Physically abused: Not on file    Forced sexual activity: Not on file  Other Topics Concern  . Not on file  Social History Narrative   Occasional caffeine use    Family History  Problem Relation Age of Onset  . Cancer Mother   . Hypertension Mother   . Hyperlipidemia Mother   . Heart disease Mother   . Breast cancer Mother   . Cancer Father   . Kidney disease Father   . Prostate cancer Father   . Hyperlipidemia Sister   . Diabetes Sister   . Hypertension Sister   . Hyperlipidemia Brother   . Hypertension Brother   . Prostate cancer Brother   . Hypertension Sister    . Hypertension Sister   . Hypertension Sister   . Hypertension Brother   . Hypertension Brother   . Hypertension Brother       Objective:   Physical Exam Constitutional:      Appearance: Normal appearance.  HENT:     Head: Normocephalic and atraumatic.  Eyes:     General:        Right eye: No discharge.        Left eye: No discharge.  Neck:     Musculoskeletal: Normal range of motion. No neck rigidity or muscular tenderness.  Cardiovascular:     Rate and Rhythm: Normal rate and regular rhythm.     Heart sounds: No murmur.  Pulmonary:     Effort: Pulmonary effort is normal. No respiratory distress.     Breath sounds: Normal breath sounds. No stridor. No wheezing or rhonchi.  Abdominal:     General: There is no distension.     Tenderness: There is no abdominal tenderness.  Neurological:     Mental Status: She is alert.    CT scan from July 2019 reviewed with the patient CT scan from June 2020 reviewed by myself PET scan was reviewed with the patient Nodule remains about 9 mm    Assessment:     Abnormal CT scan of the chest showing a 9 mm right lung nodule PET scan reviewed with the patient showing no significant activity Groin activity noted for which she had repeat CT scan of the abdomen and pelvis -CT remains unchanged -Reformed smoker -Denies any significant symptoms -Risk of a neoplastic process is present but less likely  Radiological follow-up of nodule is appropriate   History of asthma -No recent exacerbation -Rarely uses rescue inhalers -Stable  Obesity    Plan:     We will repeat a CT scan in a year  Encouraged to have rescue inhalers available for use as needed  I will see her after CT scan repeat in July 2021  Encouraged to call with any significant concerns

## 2019-03-12 ENCOUNTER — Ambulatory Visit: Payer: 59 | Admitting: Internal Medicine

## 2019-03-20 ENCOUNTER — Ambulatory Visit: Payer: 59 | Admitting: Nurse Practitioner

## 2019-04-04 ENCOUNTER — Ambulatory Visit: Payer: 59 | Admitting: Nurse Practitioner

## 2019-04-05 ENCOUNTER — Other Ambulatory Visit: Payer: Self-pay

## 2019-04-05 ENCOUNTER — Encounter: Payer: Self-pay | Admitting: Nurse Practitioner

## 2019-04-05 ENCOUNTER — Ambulatory Visit (INDEPENDENT_AMBULATORY_CARE_PROVIDER_SITE_OTHER): Payer: 59 | Admitting: Nurse Practitioner

## 2019-04-05 VITALS — BP 143/80 | HR 74 | Temp 97.6°F | Ht 68.0 in | Wt 278.0 lb

## 2019-04-05 DIAGNOSIS — Z1159 Encounter for screening for other viral diseases: Secondary | ICD-10-CM | POA: Diagnosis not present

## 2019-04-05 DIAGNOSIS — R0789 Other chest pain: Secondary | ICD-10-CM | POA: Diagnosis not present

## 2019-04-05 DIAGNOSIS — R1310 Dysphagia, unspecified: Secondary | ICD-10-CM | POA: Diagnosis not present

## 2019-04-05 MED ORDER — OMEPRAZOLE 20 MG PO CPDR: 20.0000 mg | DELAYED_RELEASE_CAPSULE | ORAL | 3 refills | Status: DC

## 2019-04-05 NOTE — Patient Instructions (Signed)
If you are age 60 or older, your body mass index should be between 23-30. Your Body mass index is 42.27 kg/m. If this is out of the aforementioned range listed, please consider follow up with your Primary Care Provider.  If you are age 39 or younger, your body mass index should be between 19-25. Your Body mass index is 42.27 kg/m. If this is out of the aformentioned range listed, please consider follow up with your Primary Care Provider.   You have been scheduled for an endoscopy. Please follow written instructions given to you at your visit today. If you use inhalers (even only as needed), please bring them with you on the day of your procedure. Your physician has requested that you go to www.startemmi.com and enter the access code given to you at your visit today. This web site gives a general overview about your procedure. However, you should still follow specific instructions given to you by our office regarding your preparation for the procedure.  We have sent the following medications to your pharmacy for you to pick up at your convenience: Omeprazole 20 mg  Thank you for choosing me and [facility].   Tye Savoy, NP

## 2019-04-08 ENCOUNTER — Encounter: Payer: Self-pay | Admitting: Nurse Practitioner

## 2019-04-08 NOTE — Progress Notes (Signed)
Chief Complaint: Burning in chest, swallowing problems  IMPRESSION and PLAN:     58 year old female with 3 to 23-month history of intermittent burning in chest associated with eating in addition to intermittent dysphagia.  Does not feel like food gets stuck in the esophagus but she has a sensation that sometimes food is going down slower than it should --Trial of Omeprazole qam --Further evaluation patient will be scheduled for EGD. The risks and benefits of EGD were discussed and the patient agrees to proceed.   HPI:     Patient is a 58 year old female known to Dr. Carlean Purl from screening colonoscopies, last 11/30/2017.  She does not need another screening colonoscopy until August 2029.   Nicole Rasmussen comes in today for evaluation of some swallowing problems.  She has burning in her chest but only when eating.  Her symptoms have been ongoing for 3 to 4 months now . she has a history of GERD but has been " fine" off medications for years.  Burning in her chest when eating is intermittent occurring only few times a week.  The type of food consumed is irrelevant.  She sometimes feels like food is not going down her esophagus like it should.  This does not occur with any particular type of food nor does it occur every time she eats.  She has not had any recent steroids or antibiotics putting her at risk for Candida esophagitis.  She has no odynophagia.  No other GI complaints. Weight is stable   Review of systems:    no SOB, no fevers, no urinary sx   Past Medical History:  Diagnosis Date  . Acid reflux   . Allergy   . Anemia   . Arthritis   . Asthma    no inhalers  . B12 deficiency   . Dysmetabolic syndrome X   . Dyspnea   . Food allergy    shellfish  . GERD (gastroesophageal reflux disease)   . HLD (hyperlipidemia)   . Joint pain    knees and back  . Lower back pain   . Nerve pain    top of right thigh  . Neuromuscular disorder ([place])    bilateral leg pain from back issue  .  Prediabetes   . Sinus problem     Patient's surgical history, family medical history, social history, medications and allergies were all reviewed in Epic     Current Outpatient Medications  Medication Sig Dispense Refill  . albuterol (VENTOLIN HFA) 108 (90 Base) MCG/ACT inhaler Inhale into the lungs every 6 (six) hours as needed for wheezing or shortness of breath.    Marland Kitchen aspirin EC 81 MG tablet Take 81 mg by mouth daily.    . cetirizine (ZYRTEC) 10 MG tablet Take 10 mg by mouth as needed for allergies.    . cyclobenzaprine (FLEXERIL) 10 MG tablet Take 10 mg by mouth 3 (three) times daily as needed for muscle spasms.    . fluticasone (FLONASE) 50 MCG/ACT nasal spray Place into both nostrils daily.    Marland Kitchen gabapentin (NEURONTIN) 300 MG capsule Take 1 capsule (300 mg total) by mouth 3 (three) times daily. Can take once daily or 2 times a day, up to 3 times a day. (Patient taking differently: Take 300 mg by mouth as needed. Can take once daily or 2 times a day, up to 3 times a day.) 90 capsule 5  . meloxicam (MOBIC) 15 MG tablet Take 15 mg by mouth  as needed.     . rosuvastatin (CRESTOR) 10 MG tablet Take 10 mg by mouth daily.    Marland Kitchen omeprazole (PRILOSEC) 20 MG capsule Take 1 capsule (20 mg total) by mouth every morning. Take 30-60 minutes before breakfast. 30 capsule 3   Current Facility-Administered Medications  Medication Dose Route Frequency Provider Last Rate Last Dose  . 0.9 %  sodium chloride infusion  500 mL Intravenous Once Gatha Mayer, MD        Physical Exam:     BP (!) 143/80   Pulse 74   Temp 97.6 F (36.4 C)   Ht 5\' 8"  (1.727 m)   Wt 278 lb (126.1 kg)   LMP 10/11/2010   SpO2 98%   BMI 42.27 kg/m   GENERAL:  Pleasant female in NAD PSYCH: : Cooperative, normal affect EENT:  conjunctiva pink, mucous membranes moist, neck supple without masses CARDIAC:  RRR, no peripheral edema PULM: Normal respiratory effort, lungs CTA bilaterally, no wheezing ABDOMEN:  Nondistended,  soft, nontender. No obvious masses, no hepatomegaly,  normal bowel sounds SKIN:  turgor, no lesions seen Musculoskeletal:  Normal muscle tone, normal strength NEURO: Alert and oriented x 3, no focal neurologic deficits   Tye Savoy , NP 04/08/2019, 11:33 AM

## 2019-04-18 ENCOUNTER — Other Ambulatory Visit: Payer: Self-pay | Admitting: Internal Medicine

## 2019-04-18 ENCOUNTER — Ambulatory Visit (INDEPENDENT_AMBULATORY_CARE_PROVIDER_SITE_OTHER): Payer: 59

## 2019-04-18 DIAGNOSIS — Z1159 Encounter for screening for other viral diseases: Secondary | ICD-10-CM

## 2019-04-18 LAB — SARS CORONAVIRUS 2 (TAT 6-24 HRS): SARS Coronavirus 2: NEGATIVE

## 2019-04-19 ENCOUNTER — Encounter: Payer: Self-pay | Admitting: Internal Medicine

## 2019-04-22 ENCOUNTER — Encounter: Payer: 59 | Admitting: Internal Medicine

## 2019-04-22 ENCOUNTER — Telehealth: Payer: Self-pay | Admitting: Gastroenterology

## 2019-04-22 NOTE — Telephone Encounter (Signed)
Patient has been rescheduled for 05/09/19.  She has also been rescheduled for a COVID screen

## 2019-04-22 NOTE — Telephone Encounter (Signed)
Her covid test was negative.  She started having URI symptoms on Saturday and went to Urgent clinic, they felt she had sinusitis and put her on Abx.  On phone she was clearly congested sounding and coughing a bit.  No fevers.  I recommended postponing her EGD 2-3 (that is on for today, Monday 21st) weeks to when she is feeling better and told her I would pass the word on.  Glendell Docker and [place], [place]

## 2019-04-25 ENCOUNTER — Emergency Department (HOSPITAL_COMMUNITY): Payer: 59

## 2019-04-25 ENCOUNTER — Emergency Department (HOSPITAL_COMMUNITY)
Admission: EM | Admit: 2019-04-25 | Discharge: 2019-04-25 | Disposition: A | Discharge status: Home / Self Care | Payer: 59 | Attending: Emergency Medicine | Admitting: Emergency Medicine

## 2019-04-25 ENCOUNTER — Encounter (HOSPITAL_COMMUNITY): Payer: Self-pay | Admitting: Emergency Medicine

## 2019-04-25 ENCOUNTER — Other Ambulatory Visit: Payer: Self-pay

## 2019-04-25 DIAGNOSIS — Z87891 Personal history of nicotine dependence: Secondary | ICD-10-CM | POA: Insufficient documentation

## 2019-04-25 DIAGNOSIS — R0609 Other forms of dyspnea: Secondary | ICD-10-CM | POA: Insufficient documentation

## 2019-04-25 DIAGNOSIS — Z79899 Other long term (current) drug therapy: Secondary | ICD-10-CM | POA: Insufficient documentation

## 2019-04-25 DIAGNOSIS — R519 Headache, unspecified: Secondary | ICD-10-CM | POA: Insufficient documentation

## 2019-04-25 DIAGNOSIS — G8929 Other chronic pain: Secondary | ICD-10-CM | POA: Diagnosis not present

## 2019-04-25 DIAGNOSIS — R072 Precordial pain: Secondary | ICD-10-CM | POA: Insufficient documentation

## 2019-04-25 DIAGNOSIS — Z6841 Body Mass Index (BMI) 40.0 and over, adult: Secondary | ICD-10-CM | POA: Diagnosis not present

## 2019-04-25 DIAGNOSIS — E669 Obesity, unspecified: Secondary | ICD-10-CM | POA: Diagnosis not present

## 2019-04-25 DIAGNOSIS — Z7982 Long term (current) use of aspirin: Secondary | ICD-10-CM | POA: Diagnosis not present

## 2019-04-25 DIAGNOSIS — R0789 Other chest pain: Secondary | ICD-10-CM

## 2019-04-25 LAB — BASIC METABOLIC PANEL
Anion gap: 8 (ref 5–15)
BUN: 11 mg/dL (ref 6–20)
CO2: 25 mmol/L (ref 22–32)
Calcium: 9.1 mg/dL (ref 8.9–10.3)
Chloride: 109 mmol/L (ref 98–111)
Creatinine, Ser: 0.8 mg/dL (ref 0.44–1.00)
GFR calc Af Amer: >60 mL/min (ref >60)
GFR calc non Af Amer: >60 mL/min (ref >60)
Glucose, Bld: 92 mg/dL (ref 70–99)
Potassium: 3.6 mmol/L (ref 3.5–5.1)
Sodium: 142 mmol/L (ref 135–145)

## 2019-04-25 LAB — CBC
HCT: 41.1 % (ref 36.0–46.0)
Hemoglobin: 13.2 g/dL (ref 12.0–15.0)
MCH: 27.4 pg (ref 26.0–34.0)
MCHC: 32.1 g/dL (ref 30.0–36.0)
MCV: 85.3 fL (ref 80.0–100.0)
Platelets: 173 10*3/uL (ref 150–400)
RBC: 4.82 MIL/uL (ref 3.87–5.11)
RDW: 13.7 % (ref 11.5–15.5)
WBC: 8.8 10*3/uL (ref 4.0–10.5)
nRBC: 0 % (ref 0.0–0.2)

## 2019-04-25 LAB — TROPONIN I (HIGH SENSITIVITY)
Troponin I (High Sensitivity): 3 ng/L (ref <18)
Troponin I (High Sensitivity): <2 ng/L (ref <18)

## 2019-04-25 MED ORDER — IOHEXOL 350 MG/ML SOLN
100.0000 mL | Freq: Once | INTRAVENOUS | Status: AC | PRN
  Administered 2019-04-25: 100 mL via INTRAVENOUS

## 2019-04-25 MED ORDER — SODIUM CHLORIDE 0.9% FLUSH: 3.0000 mL | Freq: Once | INTRAVENOUS | Status: DC
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
## 2019-04-25 NOTE — Discharge Instructions (Signed)
Please take 40mg  of Prilosec (Omeprazole) Take Tylenol for pain as needed Use flonase and allergy medicine as needed for sinus congestion  Please follow up with your GI doctor

## 2019-04-25 NOTE — ED Triage Notes (Signed)
Pt to ED with c/o mid upper chest pain for several days.  St's now pain feels like it is in her throat.  Pt also st's she has noticed that she is short of breath when she exercises

## 2019-04-25 NOTE — ED Notes (Signed)
Patient verbalizes understanding of discharge instructions. Opportunity for questioning and answers were provided. pt discharged from ED with family.   

## 2019-05-06 ENCOUNTER — Other Ambulatory Visit: Payer: Self-pay | Admitting: Internal Medicine

## 2019-05-07 ENCOUNTER — Ambulatory Visit (INDEPENDENT_AMBULATORY_CARE_PROVIDER_SITE_OTHER): Payer: 59

## 2019-05-07 DIAGNOSIS — Z1159 Encounter for screening for other viral diseases: Secondary | ICD-10-CM

## 2019-05-07 LAB — SARS CORONAVIRUS 2 (TAT 6-24 HRS): SARS Coronavirus 2: NEGATIVE

## 2019-05-08 ENCOUNTER — Telehealth: Payer: Self-pay | Admitting: Internal Medicine
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
## 2019-05-08 NOTE — Telephone Encounter (Signed)
OK  No charge/no fee

## 2019-05-09 ENCOUNTER — Encounter: Payer: 59 | Admitting: Internal Medicine

## 2019-06-26 ENCOUNTER — Ambulatory Visit (INDEPENDENT_AMBULATORY_CARE_PROVIDER_SITE_OTHER): Payer: 59 | Admitting: Bariatrics

## 2019-06-26 ENCOUNTER — Other Ambulatory Visit: Payer: Self-pay

## 2019-06-26 ENCOUNTER — Encounter (INDEPENDENT_AMBULATORY_CARE_PROVIDER_SITE_OTHER): Payer: Self-pay | Admitting: Bariatrics

## 2019-06-26 VITALS — BP 145/86 | HR 83 | Temp 97.9°F | Ht 68.0 in | Wt 287.0 lb

## 2019-06-26 DIAGNOSIS — K219 Gastro-esophageal reflux disease without esophagitis: Secondary | ICD-10-CM

## 2019-06-26 DIAGNOSIS — Z6841 Body Mass Index (BMI) 40.0 and over, adult: Secondary | ICD-10-CM

## 2019-06-26 DIAGNOSIS — E7800 Pure hypercholesterolemia, unspecified: Secondary | ICD-10-CM | POA: Diagnosis not present

## 2019-06-26 DIAGNOSIS — R7303 Prediabetes: Secondary | ICD-10-CM

## 2019-06-26 DIAGNOSIS — Z0289 Encounter for other administrative examinations: Secondary | ICD-10-CM

## 2019-06-26 DIAGNOSIS — Z1331 Encounter for screening for depression: Secondary | ICD-10-CM | POA: Diagnosis not present

## 2019-06-26 DIAGNOSIS — Z9189 Other specified personal risk factors, not elsewhere classified: Secondary | ICD-10-CM | POA: Diagnosis not present

## 2019-06-26 DIAGNOSIS — R0602 Shortness of breath: Secondary | ICD-10-CM | POA: Diagnosis not present

## 2019-06-26 DIAGNOSIS — M5417 Radiculopathy, lumbosacral region: Secondary | ICD-10-CM

## 2019-06-26 DIAGNOSIS — R5383 Other fatigue: Secondary | ICD-10-CM | POA: Diagnosis not present

## 2019-06-26 DIAGNOSIS — E559 Vitamin D deficiency, unspecified: Secondary | ICD-10-CM

## 2019-06-26 NOTE — Progress Notes (Signed)
Chief Complaint:   OBESITY Nicole Rasmussen (MR# [id number]) is a 59 y.o. female who presents for evaluation and treatment of obesity and related comorbidities. Current BMI is Body mass index is 43.64 kg/m.Marland Kitchen Nicole Rasmussen has been struggling with her weight for many years and has been unsuccessful in either losing weight, maintaining weight loss, or reaching her healthy weight goal.  Nicole Rasmussen is currently in the action stage of change and ready to dedicate time achieving and maintaining a healthier weight. Nicole Rasmussen is interested in becoming our patient and working on intensive lifestyle modifications including (but not limited to) diet and exercise for weight loss.  Nicole Rasmussen does like to cook and notes obstacles as fatigue. She craves sweets. She does skip meals, mainly breakfast and lunch.  Nicole Rasmussen's habits were reviewed today and are as follows: Her family eats meals together, she thinks her family will eat healthier with her, her desired weight loss is 117 lbs, she started gaining weight in high school, she craves sweets, she snacks frequently in the evenings, she skips breakfast and lunch frequently, she is frequently drinking liquids with calories, she frequently eats larger portions than normal and she struggles with emotional eating.  Depression Screen Nicole Rasmussen's Food and Mood (modified PHQ-9) score was 2.  Depression screen Nicole Rasmussen 2/9 06/26/2019  Decreased Interest 0  Down, Depressed, Hopeless 0  PHQ - 2 Score 0  Altered sleeping 0  Tired, decreased energy 1  Change in appetite 1  Feeling bad or failure about yourself  0  Trouble concentrating 0  Moving slowly or fidgety/restless 0  Suicidal thoughts 0  PHQ-9 Score 2  Difficult doing work/chores Not difficult at all   Subjective:   Other fatigue. [name] denies daytime somnolence and denies waking up still tired. Nicole Rasmussen generally gets 5-7 hours of sleep per night, and states that she has generally restful sleep. Snoring is sometimes present.  Apneic episodes are not present. Epworth Sleepiness Score is 6.  SOB (shortness of breath) on exertion. Nicole Rasmussen notes increasing shortness of breath with certain activities and seems to be worsening over time with weight gain. She notes getting out of breath sooner with activity than she used to. This has gotten worse recently. Nicole Rasmussen denies shortness of breath at rest or orthopnea.  Hypercholesterolemia. Nicole Rasmussen is taking Lipitor. No myalgias.  Prediabetes. Nicole Rasmussen has a diagnosis of prediabetes based on her elevated HgA1c and was informed this puts her at greater risk of developing diabetes. She continues to work on diet and exercise to decrease her risk of diabetes. She denies nausea or hypoglycemia. Nicole Rasmussen is on no medications.   Lab Results  Component Value Date   HGBA1C 5.9 (H) 02/09/2017   Lab Results  Component Value Date   INSULIN 9.5 02/09/2017   Vitamin D deficiency. Nicole Rasmussen is taking high dose Vitamin D, One-A-Day vitamins and calcium.  Lumbosacral radiculopathy at L5. Nicole Rasmussen is taking Flexeril, Neurontin, and Mobic.  Gastroesophageal reflux disease, unspecified whether esophagitis present. Nicole Rasmussen is taking Prilosec. GERD is controlled with medication.  Depression screening. Nicole Rasmussen had a negative depression screen with a PHQ-9 score of 2.  At risk for osteoporosis. Nicole Rasmussen is at higher risk of osteopenia and osteoporosis due to Vitamin D deficiency.   Assessment/Plan:   Other fatigue. Nicole Rasmussen does feel that her weight is causing her energy to be lower than it should be. Fatigue may be related to obesity, depression or many other causes. Labs will be ordered, and in the meanwhile, Nicole Rasmussen will focus on self care  including making healthy food choices, increasing physical activity and focusing on stress reduction. EKG 12-Lead, Vitamin B12, T3, T4, free, TSH ordered.  SOB (shortness of breath) on exertion. Nicole Rasmussen does feel that she gets out of breath more easily that she used to when she exercises.  Nicole Rasmussen's shortness of breath appears to be obesity related and exercise induced. She has agreed to work on weight loss and gradually increase exercise to treat her exercise induced shortness of breath. Will continue to monitor closely.  Hypercholesterolemia. Nicole Rasmussen will continue Lipitor as directed.  Prediabetes. Nicole Rasmussen will continue to work on weight loss, exercise, increasing healthy fats and protein, and decreasing simple carbohydrates to help decrease the risk of diabetes. Insulin, random labs ordered.  Vitamin D deficiency. Low Vitamin D level contributes to fatigue and are associated with obesity, breast, and colon cancer. She agrees to continue to take Vitamin D and will follow-up for routine testing of Vitamin D, at least 2-3 times per year to avoid over-replacement.  Lumbosacral radiculopathy at L5. Nicole Rasmussen will continue her medications as directed.  Gastroesophageal reflux disease, unspecified whether esophagitis present. Intensive lifestyle modifications are the first line treatment for this issue. We discussed several lifestyle modifications today and she will continue to work on diet, exercise and weight loss efforts. Orders and follow up as documented in patient record. [name] will continue her medications as directed and will avoid triggers.  Counseling . If a person has gastroesophageal reflux disease (GERD), food and stomach acid move back up into the esophagus and cause symptoms or problems such as damage to the esophagus. . Anti-reflux measures include: raising the head of the bed, avoiding tight clothing or belts, avoiding eating late at night, not lying down shortly after mealtime, and achieving weight loss. . Avoid ASA, NSAID's, caffeine, alcohol, and tobacco.  . OTC Pepcid and/or Tums are often very helpful for as needed use.  Marland Kitchen However, for persisting chronic or daily symptoms, stronger medications like Omeprazole may be needed. . You may need to avoid foods and drinks such  as: ? Coffee and tea (with or without caffeine). ? Drinks that contain alcohol. ? Energy drinks and sports drinks. ? Bubbly (carbonated) drinks or sodas. ? Chocolate and cocoa. ? Peppermint and mint flavorings. ? Garlic and onions. ? Horseradish. ? Spicy and acidic foods. These include peppers, chili powder, curry powder, vinegar, hot sauces, and BBQ sauce. ? Citrus fruit juices and citrus fruits, such as oranges, lemons, and limes. ? Tomato-based foods. These include red sauce, chili, salsa, and pizza with red sauce. ? Fried and fatty foods. These include donuts, french fries, potato chips, and high-fat dressings. ? High-fat meats. These include hot dogs, rib eye steak, sausage, ham, and bacon.  Depression screening. Kynadie had a negative depression screen.  At risk for osteoporosis. Nicole Rasmussen was given approximately 15 minutes of osteoporosis prevention counseling today. Nicole Rasmussen is at risk for osteopenia and osteoporosis due to her Vitamin D deficiency. She was encouraged to take her Vitamin D and follow her higher calcium diet and increase strengthening exercise to help strengthen her bones and decrease her risk of osteopenia and osteoporosis.  Repetitive spaced learning was employed today to elicit superior memory formation and behavioral change.  Class 3 severe obesity with serious comorbidity and body mass index (BMI) of 40.0 to 44.9 in adult, unspecified obesity type ([place]).  Nicole Rasmussen is currently in the action stage of change and her goal is to continue with weight loss efforts. I recommend Nicole Rasmussen begin the structured  treatment plan as follows:  She has agreed to the Category 4 Plan minus 100 calories (take away calories for condiments).  We reviewed lab results from 04/25/2019 with the patient including BMP, CBC, and glucose.  She will work on meal planning.  Exercise goals: All adults should avoid inactivity. Some physical activity is better than none, and adults who participate in any  amount of physical activity gain some health benefits.   Behavioral modification strategies: increasing lean protein intake, decreasing simple carbohydrates, increasing vegetables, increasing water intake, decreasing eating out, no skipping meals, meal planning and cooking strategies and keeping healthy foods in the home.  She was informed of the importance of frequent follow-up visits to maximize her success with intensive lifestyle modifications for her multiple health conditions. She was informed we would discuss her lab results at her next visit unless there is a critical issue that needs to be addressed sooner. Nicole Rasmussen agreed to keep her next visit at the agreed upon time to discuss these results.  Objective:   Blood pressure (!) 145/86, pulse 83, temperature 97.9 F (36.6 C), temperature source Oral, height 5\' 8"  (1.727 m), weight 287 lb (130.2 kg), last menstrual period 10/11/2010, SpO2 98 %. Body mass index is 43.64 kg/m.  EKG: Sinus  Rhythm with a rate of 85 BPM. Low voltage in precordial leads. Old anterior infarct. Abnormal.   Indirect Calorimeter completed today shows a VO2 of 316 and a REE of 2205.  Her calculated basal metabolic rate is [id number] thus her basal metabolic rate is better than expected.  General: Cooperative, alert, well developed, in no acute distress. HEENT: Conjunctivae and lids unremarkable. Cardiovascular: Regular rhythm.  Lungs: Normal work of breathing. Neurologic: No focal deficits.   Lab Results  Component Value Date   CREATININE 0.80 04/25/2019   BUN 11 04/25/2019   NA 142 04/25/2019   K 3.6 04/25/2019   CL 109 04/25/2019   CO2 25 04/25/2019   Lab Results  Component Value Date   ALT 24 02/09/2017   AST 17 02/09/2017   ALKPHOS 85 02/09/2017   BILITOT 0.4 02/09/2017   Lab Results  Component Value Date   HGBA1C 5.9 (H) 02/09/2017   Lab Results  Component Value Date   INSULIN 9.5 02/09/2017   Lab Results  Component Value Date   TSH 1.388  11/11/2017   Lab Results  Component Value Date   CHOL 216 (H) 02/09/2017   HDL 43 02/09/2017   LDLCALC 160 (H) 02/09/2017   TRIG 66 02/09/2017   Lab Results  Component Value Date   WBC 8.8 04/25/2019   HGB 13.2 04/25/2019   HCT 41.1 04/25/2019   MCV 85.3 04/25/2019   PLT 173 04/25/2019   No results found for: IRON, TIBC, FERRITIN  Attestation Statements:   Reviewed by clinician on day of visit: allergies, medications, problem list, medical history, surgical history, family history, social history, and previous encounter notes.  Migdalia Dk, am acting as Location manager for [employer], DO   I have reviewed the above documentation for accuracy and completeness, and I agree with the above. Jearld Lesch, DO

## 2019-06-27 LAB — VITAMIN B12: Vitamin B-12: 535 pg/mL (ref 232–1245)

## 2019-06-27 LAB — TSH: TSH: 2.5 u[IU]/mL (ref 0.450–4.500)

## 2019-06-27 LAB — T4, FREE: Free T4: 0.97 ng/dL (ref 0.82–1.77)

## 2019-06-27 LAB — T3: T3, Total: 145 ng/dL (ref 71–180)

## 2019-06-27 LAB — INSULIN, RANDOM: INSULIN: 13.1 u[IU]/mL (ref 2.6–24.9)

## 2019-07-10 ENCOUNTER — Ambulatory Visit (INDEPENDENT_AMBULATORY_CARE_PROVIDER_SITE_OTHER): Payer: 59 | Admitting: Bariatrics

## 2019-07-10 ENCOUNTER — Other Ambulatory Visit: Payer: Self-pay

## 2019-07-10 ENCOUNTER — Encounter (INDEPENDENT_AMBULATORY_CARE_PROVIDER_SITE_OTHER): Payer: Self-pay | Admitting: Bariatrics

## 2019-07-10 VITALS — BP 123/83 | HR 92 | Temp 98.0°F | Ht 68.0 in | Wt 277.0 lb

## 2019-07-10 DIAGNOSIS — Z6841 Body Mass Index (BMI) 40.0 and over, adult: Secondary | ICD-10-CM

## 2019-07-10 DIAGNOSIS — E559 Vitamin D deficiency, unspecified: Secondary | ICD-10-CM | POA: Diagnosis not present

## 2019-07-10 DIAGNOSIS — E7800 Pure hypercholesterolemia, unspecified: Secondary | ICD-10-CM

## 2019-07-10 DIAGNOSIS — R7303 Prediabetes: Secondary | ICD-10-CM

## 2019-07-10 NOTE — Progress Notes (Signed)
Chief Complaint:   OBESITY Nicole Rasmussen is here to discuss her progress with her obesity treatment plan along with follow-up of her obesity related diagnoses. Nicole Rasmussen is on the Category 4 Plan minus 100 calories and states she is following her eating plan approximately 95% of the time. Nicole Rasmussen states she is walking 60 minutes 3 times per week.  Today's visit was #: 13 Starting weight: 281 lbs Starting date: 02/09/2017 Today's weight: 277 lbs Today's date: 07/10/2019 Total lbs lost to date: 4 Total lbs lost since last in-office visit: 10  Interim History: Nicole Rasmussen is down 10 lbs from her initial visit. She liked the fact that she stayed full, but sometimes it was hard to get everything in. She does not like the milk choices.  Subjective:   Prediabetes. Nicole Rasmussen has a diagnosis of prediabetes based on her elevated HgA1c and was informed this puts her at greater risk of developing diabetes. She continues to work on diet and exercise to decrease her risk of diabetes. She denies nausea or hypoglycemia.  Lab Results  Component Value Date   HGBA1C 5.9 (H) 02/09/2017   Lab Results  Component Value Date   INSULIN 13.1 06/26/2019   INSULIN 9.5 02/09/2017   Hypercholesteremia. Nicole Rasmussen is taking Lipitor.  Vitamin D deficiency. Nicole Rasmussen is taking high dose Vitamin D.  Assessment/Plan:   Prediabetes. Nicole Rasmussen will continue to work on weight loss, exercise, increasing healthy fats and protein, and decreasing simple carbohydrates to help decrease the risk of diabetes.   Hypercholesteremia. Nicole Rasmussen will continue Lipitor as directed.  Vitamin D deficiency. Low Vitamin D level contributes to fatigue and are associated with obesity, breast, and colon cancer. She agrees to continue to take Vitamin D and will follow-up for routine testing of Vitamin D, at least 2-3 times per year to avoid over-replacement.  Class 3 severe obesity with serious comorbidity and body mass index (BMI) of 40.0 to 44.9 in adult,  unspecified obesity type (Nicole Rasmussen).  Nicole Rasmussen is currently in the action stage of change. As such, her goal is to continue with weight loss efforts. She has agreed to the Category 4 Plan minus 100 calories with additional lunch and dinner options.   She will work on meal planning and intentional eating.  We independently reviewed labs from 06/26/2019 with the patient including B12, insulin, and thyroid panel.  Exercise goals: Nicole Rasmussen will continue walking 60 minutes 3 days a week.  Behavioral modification strategies: increasing lean protein intake, decreasing simple carbohydrates, increasing vegetables, increasing water intake, decreasing eating out, no skipping meals, meal planning and cooking strategies, keeping healthy foods in the home, better snacking choices, emotional eating strategies and planning for success.  Nicole Rasmussen has agreed to follow-up with our clinic in 2 weeks. She was informed of the importance of frequent follow-up visits to maximize her success with intensive lifestyle modifications for her multiple health conditions.   Objective:   Blood pressure 123/83, pulse 92, temperature 98 F (36.7 C), temperature source Oral, height 5\' 8"  (1.727 m), weight 277 lb (125.6 kg), last menstrual period 10/11/2010, SpO2 96 %. Body mass index is 42.12 kg/m.  General: Cooperative, alert, well developed, in no acute distress. HEENT: Conjunctivae and lids unremarkable. Cardiovascular: Regular rhythm.  Lungs: Normal work of breathing. Neurologic: No focal deficits.   Lab Results  Component Value Date   CREATININE 0.80 04/25/2019   BUN 11 04/25/2019   NA 142 04/25/2019   K 3.6 04/25/2019   CL 109 04/25/2019   CO2 25  04/25/2019   Lab Results  Component Value Date   ALT 24 02/09/2017   AST 17 02/09/2017   ALKPHOS 85 02/09/2017   BILITOT 0.4 02/09/2017   Lab Results  Component Value Date   HGBA1C 5.9 (H) 02/09/2017   Lab Results  Component Value Date   INSULIN 13.1 06/26/2019    INSULIN 9.5 02/09/2017   Lab Results  Component Value Date   TSH 2.500 06/26/2019   Lab Results  Component Value Date   CHOL 216 (H) 02/09/2017   HDL 43 02/09/2017   LDLCALC 160 (H) 02/09/2017   TRIG 66 02/09/2017   Lab Results  Component Value Date   WBC 8.8 04/25/2019   HGB 13.2 04/25/2019   HCT 41.1 04/25/2019   MCV 85.3 04/25/2019   PLT 173 04/25/2019   No results found for: IRON, TIBC, FERRITIN  Attestation Statements:   Reviewed by clinician on day of visit: allergies, medications, problem list, medical history, surgical history, family history, social history, and previous encounter notes.  Time spent on visit including pre-visit chart review and post-visit charting and care was 30 minutes.   Migdalia Dk, am acting as Location manager for [employer], DO   I have reviewed the above documentation for accuracy and completeness, and I agree with the above. Jearld Lesch, DO

## 2019-07-11 ENCOUNTER — Encounter (INDEPENDENT_AMBULATORY_CARE_PROVIDER_SITE_OTHER): Payer: Self-pay | Admitting: Bariatrics

## 2019-07-11 ENCOUNTER — Ambulatory Visit: Payer: 59 | Attending: Internal Medicine

## 2019-07-11 DIAGNOSIS — Z23 Encounter for immunization: Secondary | ICD-10-CM

## 2019-07-11 NOTE — Progress Notes (Signed)
   Covid-19 Vaccination Clinic  Name:  Nicole Rasmussen    MRN: [id number] DOB: [birth year]  07/11/2019  [name] was observed post Covid-19 immunization for 15 minutes without incident. She was provided with Vaccine Information Sheet and instruction to access the V-Safe system.   [name] was instructed to call 911 with any severe reactions post vaccine: Marland Kitchen Difficulty breathing  . Swelling of face and throat  . A fast heartbeat  . A bad rash all over body  . Dizziness and weakness   Immunizations Administered    Name Date Dose VIS Date Route   Pfizer COVID-19 Vaccine 07/11/2019  4:01 PM 0.3 mL 04/12/2019 Intramuscular   Manufacturer: [place]   Lot: [id number]   [place]: [id number]

## 2019-07-25 ENCOUNTER — Encounter (INDEPENDENT_AMBULATORY_CARE_PROVIDER_SITE_OTHER): Payer: Self-pay | Admitting: Bariatrics

## 2019-07-25 ENCOUNTER — Other Ambulatory Visit: Payer: Self-pay

## 2019-07-25 ENCOUNTER — Ambulatory Visit (INDEPENDENT_AMBULATORY_CARE_PROVIDER_SITE_OTHER): Payer: 59 | Admitting: Bariatrics

## 2019-07-25 VITALS — BP 121/82 | HR 88 | Temp 98.4°F | Ht 68.0 in | Wt 270.0 lb

## 2019-07-25 DIAGNOSIS — E7800 Pure hypercholesterolemia, unspecified: Secondary | ICD-10-CM | POA: Diagnosis not present

## 2019-07-25 DIAGNOSIS — Z6841 Body Mass Index (BMI) 40.0 and over, adult: Secondary | ICD-10-CM

## 2019-07-25 DIAGNOSIS — R7303 Prediabetes: Secondary | ICD-10-CM

## 2019-07-25 NOTE — Progress Notes (Signed)
Chief Complaint:   OBESITY Nicole Rasmussen is here to discuss her progress with her obesity treatment plan along with follow-up of her obesity related diagnoses. Nicole Rasmussen is on the Category 4 Plan minus 100 calories and states she is following her eating plan approximately 95% of the time. Nicole Rasmussen states she is walking 60 minutes 1-2 times per week.  Today's visit was #: 14 Starting weight: 281 lbs Starting date: 02/09/2017 Today's weight: 270 lbs Today's date: 07/25/2019 Total lbs lost to date: 11 Total lbs lost since last in-office visit: 7  Interim History: Nicole Rasmussen is down 7 lbs and doing well overall. She reports getting in enough protein.  Subjective:   Hypercholesterolemia. Nicole Rasmussen is taking Lipitor.  Prediabetes. Nicole Rasmussen has a diagnosis of prediabetes based on her elevated HgA1c and was informed this puts her at greater risk of developing diabetes. She continues to work on diet and exercise to decrease her risk of diabetes. She denies nausea or hypoglycemia. No polyphagia.  Lab Results  Component Value Date   HGBA1C 5.9 (H) 02/09/2017   Lab Results  Component Value Date   INSULIN 13.1 06/26/2019   INSULIN 9.5 02/09/2017   Assessment/Plan:   Hypercholesterolemia. Nicole Rasmussen will continue Lipitor as directed.  Prediabetes. Nicole Rasmussen will continue to work on weight loss, exercise, increasing healthy fats and protein, and decreasing simple carbohydrates to help decrease the risk of diabetes.   Class 3 severe obesity with serious comorbidity and body mass index (BMI) of 40.0 to 44.9 in adult, unspecified obesity type (Nicole Rasmussen).  Nicole Rasmussen is currently in the action stage of change. As such, her goal is to continue with weight loss efforts. She has agreed to the Category 4 Plan minus 100 calories and will journal 550-650 calories and 40 grams of protein at supper using Lose It App.   Exercise goals: Nicole Rasmussen will continue walking 60 minutes 2 days per week and will increase over  time.  Behavioral modification strategies: increasing lean protein intake, decreasing simple carbohydrates, increasing vegetables, increasing water intake, decreasing eating out, no skipping meals, meal planning and cooking strategies and keeping healthy foods in the home.  Nicole Rasmussen has agreed to follow-up with our clinic in 2 weeks. She was informed of the importance of frequent follow-up visits to maximize her success with intensive lifestyle modifications for her multiple health conditions.   Objective:   Blood pressure 121/82, pulse 88, temperature 98.4 F (36.9 C), height 5\' 8"  (1.727 m), weight 270 lb (122.5 kg), last menstrual period 10/11/2010, SpO2 95 %. Body mass index is 41.05 kg/m.  General: Cooperative, alert, well developed, in no acute distress. HEENT: Conjunctivae and lids unremarkable. Cardiovascular: Regular rhythm.  Lungs: Normal work of breathing. Neurologic: No focal deficits.   Lab Results  Component Value Date   CREATININE 0.80 04/25/2019   BUN 11 04/25/2019   NA 142 04/25/2019   K 3.6 04/25/2019   CL 109 04/25/2019   CO2 25 04/25/2019   Lab Results  Component Value Date   ALT 24 02/09/2017   AST 17 02/09/2017   ALKPHOS 85 02/09/2017   BILITOT 0.4 02/09/2017   Lab Results  Component Value Date   HGBA1C 5.9 (H) 02/09/2017   Lab Results  Component Value Date   INSULIN 13.1 06/26/2019   INSULIN 9.5 02/09/2017   Lab Results  Component Value Date   TSH 2.500 06/26/2019   Lab Results  Component Value Date   CHOL 216 (H) 02/09/2017   HDL 43 02/09/2017   [place]  160 (H) 02/09/2017   TRIG 66 02/09/2017   Lab Results  Component Value Date   WBC 8.8 04/25/2019   HGB 13.2 04/25/2019   HCT 41.1 04/25/2019   MCV 85.3 04/25/2019   PLT 173 04/25/2019   No results found for: IRON, TIBC, FERRITIN  Attestation Statements:   Reviewed by clinician on day of visit: allergies, medications, problem list, medical history, surgical history, family history,  social history, and previous encounter notes.  Time spent on visit including pre-visit chart review and post-visit charting and care was 20 minutes.   Migdalia Dk, am acting as Location manager for [employer], DO   I have reviewed the above documentation for accuracy and completeness, and I agree with the above. Jearld Lesch, DO

## 2019-08-08 ENCOUNTER — Ambulatory Visit (INDEPENDENT_AMBULATORY_CARE_PROVIDER_SITE_OTHER): Payer: 59 | Admitting: Bariatrics

## 2019-08-08 ENCOUNTER — Other Ambulatory Visit: Payer: Self-pay

## 2019-08-08 ENCOUNTER — Encounter (INDEPENDENT_AMBULATORY_CARE_PROVIDER_SITE_OTHER): Payer: Self-pay | Admitting: Bariatrics

## 2019-08-08 VITALS — BP 114/81 | HR 98 | Temp 98.2°F | Ht 68.0 in | Wt 266.0 lb

## 2019-08-08 DIAGNOSIS — E559 Vitamin D deficiency, unspecified: Secondary | ICD-10-CM | POA: Diagnosis not present

## 2019-08-08 DIAGNOSIS — R7303 Prediabetes: Secondary | ICD-10-CM | POA: Diagnosis not present

## 2019-08-08 DIAGNOSIS — Z6841 Body Mass Index (BMI) 40.0 and over, adult: Secondary | ICD-10-CM

## 2019-08-12 ENCOUNTER — Encounter (INDEPENDENT_AMBULATORY_CARE_PROVIDER_SITE_OTHER): Payer: Self-pay | Admitting: Bariatrics

## 2019-08-12 NOTE — Progress Notes (Signed)
Chief Complaint:   OBESITY Nicole Rasmussen is here to discuss her progress with her obesity treatment plan along with follow-up of her obesity related diagnoses. Nicole Rasmussen is on the Category 4 Plan minus 100 calories and states she is following her eating plan approximately 85-90% of the time. Nicole Rasmussen states she is walking 10,000 steps 5-7 times per week.  Today's visit was #: 15 Starting weight: 281 lbs Starting date: 02/09/2017 Today's weight: 266 lbs Today's date: 08/08/2019 Total lbs lost to date: 15 Total lbs lost since last in-office visit: 4  Interim History: Nicole Rasmussen is down 4 lbs. Her clothes are more loose.  Subjective:   Vitamin D deficiency. [name] is taking Vitamin D. No nausea, vomiting, or muscle weakness,  Prediabetes. Nicole Rasmussen has a diagnosis of prediabetes based on her elevated HgA1c and was informed this puts her at greater risk of developing diabetes. She continues to work on diet and exercise to decrease her risk of diabetes. She denies nausea or hypoglycemia. Nicole Rasmussen is on no medications.  Lab Results  Component Value Date   HGBA1C 5.9 (H) 02/09/2017   Lab Results  Component Value Date   INSULIN 13.1 06/26/2019   INSULIN 9.5 02/09/2017   Assessment/Plan:   Vitamin D deficiency. Low Vitamin D level contributes to fatigue and are associated with obesity, breast, and colon cancer. She agrees to continue to take Vitamin D and will follow-up for routine testing of Vitamin D, at least 2-3 times per year to avoid over-replacement.  Prediabetes. [name] will continue to work on weight loss, exercise, increasing healthy fats and protein, and decreasing simple carbohydrates to help decrease the risk of diabetes.   Class 3 severe obesity with serious comorbidity and body mass index (BMI) of 40.0 to 44.9 in adult, unspecified obesity type (Nicole Rasmussen).  Nicole Rasmussen is currently in the action stage of change. As such, her goal is to continue with weight loss efforts. She has agreed to the  Category 4 Plan minus 100 calories and journal 550-650 calories and 40 grams of protein at supper.   She will work on meal planning and intentional eating. Handout was given on Protein Equivalents.  Exercise goals: Nicole Rasmussen will continue walking 10,000 steps daily.  Behavioral modification strategies: increasing lean protein intake, decreasing simple carbohydrates, increasing vegetables, increasing water intake, decreasing eating out, no skipping meals, meal planning and cooking strategies, keeping healthy foods in the home, ways to avoid boredom eating, ways to avoid night time snacking, better snacking choices, emotional eating strategies and planning for success.  Nicole Rasmussen has agreed to follow-up with our clinic in 2 weeks. She was informed of the importance of frequent follow-up visits to maximize her success with intensive lifestyle modifications for her multiple health conditions.   Objective:   Blood pressure 114/81, pulse 98, temperature 98.2 F (36.8 C), height 5\' 8"  (1.727 m), weight 266 lb (120.7 kg), last menstrual period 10/11/2010, SpO2 97 %. Body mass index is 40.45 kg/m.  General: Cooperative, alert, well developed, in no acute distress. HEENT: Conjunctivae and lids unremarkable. Cardiovascular: Regular rhythm.  Lungs: Normal work of breathing. Neurologic: No focal deficits.   Lab Results  Component Value Date   CREATININE 0.80 04/25/2019   BUN 11 04/25/2019   NA 142 04/25/2019   K 3.6 04/25/2019   CL 109 04/25/2019   CO2 25 04/25/2019   Lab Results  Component Value Date   ALT 24 02/09/2017   AST 17 02/09/2017   ALKPHOS 85 02/09/2017   BILITOT 0.4  02/09/2017   Lab Results  Component Value Date   HGBA1C 5.9 (H) 02/09/2017   Lab Results  Component Value Date   INSULIN 13.1 06/26/2019   INSULIN 9.5 02/09/2017   Lab Results  Component Value Date   TSH 2.500 06/26/2019   Lab Results  Component Value Date   CHOL 216 (H) 02/09/2017   HDL 43 02/09/2017    LDLCALC 160 (H) 02/09/2017   TRIG 66 02/09/2017   Lab Results  Component Value Date   WBC 8.8 04/25/2019   HGB 13.2 04/25/2019   HCT 41.1 04/25/2019   MCV 85.3 04/25/2019   PLT 173 04/25/2019   No results found for: IRON, TIBC, FERRITIN  Attestation Statements:   Reviewed by clinician on day of visit: allergies, medications, problem list, medical history, surgical history, family history, social history, and previous encounter notes.  Time spent on visit including pre-visit chart review and post-visit charting and care was 20 minutes.   Migdalia Dk, am acting as Location manager for [employer], DO   I have reviewed the above documentation for accuracy and completeness, and I agree with the above. Jearld Lesch, DO

## 2019-08-20 LAB — LIPID PANEL
Cholesterol: 163 (ref 0–200)
HDL: 48 (ref 35–70)
LDL Cholesterol: 103
Triglycerides: 60 (ref 40–160)

## 2019-08-20 LAB — IRON,TIBC AND FERRITIN PANEL: Ferritin: 167.4

## 2019-08-20 LAB — COMPREHENSIVE METABOLIC PANEL
Albumin: 4.2 (ref 3.5–5.0)
GFR calc Af Amer: 77
GFR calc non Af Amer: 64
Globulin: 2.7

## 2019-08-20 LAB — VITAMIN D 25 HYDROXY (VIT D DEFICIENCY, FRACTURES): Vit D, 25-Hydroxy: 58.56

## 2019-08-20 LAB — TSH: TSH: 1.44 (ref 0.41–5.90)

## 2019-08-20 LAB — HEPATIC FUNCTION PANEL
ALT: 38 — AB (ref 7–35)
AST: 27 (ref 13–35)
Alkaline Phosphatase: 59 (ref 25–125)

## 2019-08-20 LAB — HEMOGLOBIN A1C: Hemoglobin A1C: 6.3

## 2019-08-20 LAB — BASIC METABOLIC PANEL
BUN: 19 (ref 4–21)
Creatinine: 0.9 (ref 0.5–1.1)
Glucose: 96

## 2019-08-21 ENCOUNTER — Other Ambulatory Visit (HOSPITAL_BASED_OUTPATIENT_CLINIC_OR_DEPARTMENT_OTHER): Payer: Self-pay | Admitting: Family Medicine

## 2019-08-21 DIAGNOSIS — R911 Solitary pulmonary nodule: Secondary | ICD-10-CM

## 2019-08-22 ENCOUNTER — Other Ambulatory Visit: Payer: Self-pay

## 2019-08-22 ENCOUNTER — Ambulatory Visit (INDEPENDENT_AMBULATORY_CARE_PROVIDER_SITE_OTHER): Payer: 59 | Admitting: Family Medicine

## 2019-08-22 ENCOUNTER — Encounter (INDEPENDENT_AMBULATORY_CARE_PROVIDER_SITE_OTHER): Payer: Self-pay | Admitting: Family Medicine

## 2019-08-22 VITALS — BP 125/76 | HR 75 | Temp 98.1°F | Ht 68.0 in | Wt 266.0 lb

## 2019-08-22 DIAGNOSIS — R7303 Prediabetes: Secondary | ICD-10-CM

## 2019-08-22 DIAGNOSIS — Z6841 Body Mass Index (BMI) 40.0 and over, adult: Secondary | ICD-10-CM | POA: Diagnosis not present

## 2019-08-26 ENCOUNTER — Encounter (INDEPENDENT_AMBULATORY_CARE_PROVIDER_SITE_OTHER): Payer: Self-pay | Admitting: Family Medicine

## 2019-08-26 DIAGNOSIS — R7303 Prediabetes: Secondary | ICD-10-CM | POA: Insufficient documentation

## 2019-08-26 NOTE — Progress Notes (Signed)
Chief Complaint:   OBESITY Reata Westrick is here to discuss her progress with her obesity treatment plan along with follow-up of her obesity related diagnoses. Dymon is on the Category 4 Plan and states she is following her eating plan approximately 95% of the time. Nash states she is walking 60 minutes 4-5 times per week.  Today's visit was #: 3 Starting weight: 281 lbs Starting date: 02/09/2017 Today's weight: 266 lbs Today's date: 08/22/2019 Total lbs lost to date: 15 Total lbs lost since last in-office visit: 0  Interim History: Kaylnn struggles to eat all of the meat at times and reports she is using protein equivalents to meet her protein goals. Hunger is satisfied overall.  Subjective:   Prediabetes. Lusia has a diagnosis of prediabetes based on her elevated HgA1c and was informed this puts her at greater risk of developing diabetes. She continues to work on diet and exercise to decrease her risk of diabetes. She denies nausea or hypoglycemia. Haydn reports getting labs at her PCP's office this past week. Her A1c was 6.0 in January. She is not on metformin and would like to avoid taking it if possible. She reports having a family history of diabetes.   Lab Results  Component Value Date   HGBA1C 5.9 (H) 02/09/2017   Lab Results  Component Value Date   INSULIN 13.1 06/26/2019   INSULIN 9.5 02/09/2017   Assessment/Plan:   Prediabetes. Zhariah will continue to work on weight loss, exercise, and decreasing simple carbohydrates to help decrease the risk of diabetes. She will continue her current meal plan and will bring labs to her next visit.  Class 3 severe obesity with serious comorbidity and body mass index (BMI) of 40.0 to 44.9 in adult, unspecified obesity type ([place]).  Penelopi is currently in the action stage of change. As such, her goal is to continue with weight loss efforts. She has agreed to the Category 4 Plan minus 100 calories and will journal 550-650 calories  + 40 grams of protein at supper.   Exercise goals: Quanita will continue her current exercise regimen.  Behavioral modification strategies: increasing lean protein intake and increasing water intake.  Jasi has agreed to follow-up with our clinic in 2 weeks. She was informed of the importance of frequent follow-up visits to maximize her success with intensive lifestyle modifications for her multiple health conditions.   Objective:   Blood pressure 125/76, pulse 75, temperature 98.1 F (36.7 C), temperature source Oral, height 5\' 8"  (1.727 m), weight 266 lb (120.7 kg), last menstrual period 10/11/2010, SpO2 98 %. Body mass index is 40.45 kg/m.  General: Cooperative, alert, well developed, in no acute distress. HEENT: Conjunctivae and lids unremarkable. Cardiovascular: Regular rhythm.  Lungs: Normal work of breathing. Neurologic: No focal deficits.   Lab Results  Component Value Date   CREATININE 0.80 04/25/2019   BUN 11 04/25/2019   NA 142 04/25/2019   K 3.6 04/25/2019   CL 109 04/25/2019   CO2 25 04/25/2019   Lab Results  Component Value Date   ALT 24 02/09/2017   AST 17 02/09/2017   ALKPHOS 85 02/09/2017   BILITOT 0.4 02/09/2017   Lab Results  Component Value Date   HGBA1C 5.9 (H) 02/09/2017   Lab Results  Component Value Date   INSULIN 13.1 06/26/2019   INSULIN 9.5 02/09/2017   Lab Results  Component Value Date   TSH 2.500 06/26/2019   Lab Results  Component Value Date   CHOL 216 (  H) 02/09/2017   HDL 43 02/09/2017   LDLCALC 160 (H) 02/09/2017   TRIG 66 02/09/2017   Lab Results  Component Value Date   WBC 8.8 04/25/2019   HGB 13.2 04/25/2019   HCT 41.1 04/25/2019   MCV 85.3 04/25/2019   PLT 173 04/25/2019   No results found for: IRON, TIBC, FERRITIN  Attestation Statements:   Reviewed by clinician on day of visit: allergies, medications, problem list, medical history, surgical history, family history, social history, and previous encounter  notes.  IMichaelene Song, am acting as Location manager for [employer], FNP   I have reviewed the above documentation for accuracy and completeness, and I agree with the above. -  Georgianne Fick, FNP

## 2019-08-27 ENCOUNTER — Ambulatory Visit (INDEPENDENT_AMBULATORY_CARE_PROVIDER_SITE_OTHER): Payer: 59 | Admitting: Bariatrics

## 2019-09-09 ENCOUNTER — Encounter (INDEPENDENT_AMBULATORY_CARE_PROVIDER_SITE_OTHER): Payer: Self-pay | Admitting: Family Medicine

## 2019-09-09 ENCOUNTER — Other Ambulatory Visit: Payer: Self-pay

## 2019-09-09 ENCOUNTER — Ambulatory Visit (INDEPENDENT_AMBULATORY_CARE_PROVIDER_SITE_OTHER): Payer: 59 | Admitting: Family Medicine

## 2019-09-09 VITALS — BP 118/80 | HR 87 | Temp 98.0°F | Ht 68.0 in | Wt 265.0 lb

## 2019-09-09 DIAGNOSIS — E559 Vitamin D deficiency, unspecified: Secondary | ICD-10-CM

## 2019-09-09 DIAGNOSIS — Z6841 Body Mass Index (BMI) 40.0 and over, adult: Secondary | ICD-10-CM

## 2019-09-09 NOTE — Progress Notes (Signed)
Chief Complaint:   OBESITY Nicole Rasmussen is here to discuss her progress with her obesity treatment plan along with follow-up of her obesity related diagnoses. Nicole Rasmussen is on the Category 4 Plan and keeping a food journal and adhering to recommended goals of 550-650 calories and 40+ grams of protein at supper daily and states she is following her eating plan approximately 60% of the time. Nicole Rasmussen states she is walking for 60 minutes 3-4 times per week.  Today's visit was #: 21 Starting weight: 281 lbs Starting date: 02/09/2017 Today's weight: 265 lbs Today's date: 09/09/2019 Total lbs lost to date: 16 Total lbs lost since last in-office visit: 1  Interim History: Nicole Rasmussen notes some recent celebrations but she did well on the plan between celebrations and she is happy with a 1 lb loss. She is working on getting protein in and she does have protein shakes at times. Her hunger is satisfied. She does note sweet cravings. She journals at supper sometimes. Her water intake is good at home but not at work.  Subjective:   1. Vitamin D deficiency Nicole Rasmussen's primary care physician checked her Vit D and told her it was at goal. Her primary care physician took her off prescription Vit D. She is on OTC calcium with Vit D.  Assessment/Plan:   1. Vitamin D deficiency Low Vitamin D level contributes to fatigue and are associated with obesity, breast, and colon cancer. Nicole Rasmussen agreed to continue taking OTC calcium with Vit D. She is to bring in her labs to her next office visit. She will follow-up for routine testing of Vitamin D, at least 2-3 times per year to avoid over-replacement.  2. Class 3 severe obesity with serious comorbidity and body mass index (BMI) of 40.0 to 44.9 in adult, unspecified obesity type [facility]) Nicole Rasmussen is currently in the action stage of change. As such, her goal is to continue with weight loss efforts. She has agreed to the Category 4 Plan.   Exercise goals: As is.  Behavioral modification  strategies: increasing lean protein intake and increasing water intake.  Nicole Rasmussen has agreed to follow-up with our clinic in 2 weeks. She was informed of the importance of frequent follow-up visits to maximize her success with intensive lifestyle modifications for her multiple health conditions.   Objective:   Blood pressure 118/80, pulse 87, temperature 98 F (36.7 C), temperature source Oral, height 5\' 8"  (1.727 m), weight 265 lb (120.2 kg), last menstrual period 10/11/2010, SpO2 98 %. Body mass index is 40.29 kg/m.  General: Cooperative, alert, well developed, in no acute distress. HEENT: Conjunctivae and lids unremarkable. Cardiovascular: Regular rhythm.  Lungs: Normal work of breathing. Neurologic: No focal deficits.   Lab Results  Component Value Date   CREATININE 0.80 04/25/2019   BUN 11 04/25/2019   NA 142 04/25/2019   K 3.6 04/25/2019   CL 109 04/25/2019   CO2 25 04/25/2019   Lab Results  Component Value Date   ALT 24 02/09/2017   AST 17 02/09/2017   ALKPHOS 85 02/09/2017   BILITOT 0.4 02/09/2017   Lab Results  Component Value Date   HGBA1C 5.9 (H) 02/09/2017   Lab Results  Component Value Date   INSULIN 13.1 06/26/2019   INSULIN 9.5 02/09/2017   Lab Results  Component Value Date   TSH 2.500 06/26/2019   Lab Results  Component Value Date   CHOL 216 (H) 02/09/2017   HDL 43 02/09/2017   LDLCALC 160 (H) 02/09/2017   TRIG  66 02/09/2017   Lab Results  Component Value Date   WBC 8.8 04/25/2019   HGB 13.2 04/25/2019   HCT 41.1 04/25/2019   MCV 85.3 04/25/2019   PLT 173 04/25/2019   No results found for: IRON, TIBC, FERRITIN  Attestation Statements:   Reviewed by clinician on day of visit: allergies, medications, problem list, medical history, surgical history, family history, social history, and previous encounter notes.   Nicole Rasmussen, am acting as Location manager for [employer], FNP-C.  I have reviewed the above documentation for accuracy  and completeness, and I agree with the above. -  Nicole Fick, FNP

## 2019-09-10 ENCOUNTER — Ambulatory Visit
Admission: RE | Admit: 2019-09-10 | Discharge: 2019-09-10 | Disposition: A | Discharge status: Home / Self Care | Payer: 59 | Source: Ambulatory Visit | Attending: Family Medicine | Admitting: Family Medicine

## 2019-09-10 DIAGNOSIS — R911 Solitary pulmonary nodule: Secondary | ICD-10-CM

## 2019-09-23 ENCOUNTER — Ambulatory Visit (INDEPENDENT_AMBULATORY_CARE_PROVIDER_SITE_OTHER): Payer: 59 | Admitting: Family Medicine

## 2019-10-07 ENCOUNTER — Other Ambulatory Visit: Payer: Self-pay

## 2019-10-07 ENCOUNTER — Encounter (INDEPENDENT_AMBULATORY_CARE_PROVIDER_SITE_OTHER): Payer: Self-pay | Admitting: Family Medicine

## 2019-10-07 ENCOUNTER — Ambulatory Visit (INDEPENDENT_AMBULATORY_CARE_PROVIDER_SITE_OTHER): Payer: 59 | Admitting: Family Medicine

## 2019-10-07 VITALS — BP 121/83 | HR 102 | Temp 98.0°F | Ht 68.0 in | Wt 263.0 lb

## 2019-10-07 DIAGNOSIS — E559 Vitamin D deficiency, unspecified: Secondary | ICD-10-CM | POA: Diagnosis not present

## 2019-10-07 DIAGNOSIS — R7303 Prediabetes: Secondary | ICD-10-CM | POA: Diagnosis not present

## 2019-10-07 DIAGNOSIS — Z6841 Body Mass Index (BMI) 40.0 and over, adult: Secondary | ICD-10-CM

## 2019-10-08 NOTE — Progress Notes (Signed)
Chief Complaint:   OBESITY Nicole Rasmussen is here to discuss her progress with her obesity treatment plan along with follow-up of her obesity related diagnoses. Nicole Rasmussen is on the Category 4 Plan and states she is following her eating plan approximately 85-90% of the time. Nicole Rasmussen states she is walking for 60 minutes 3-4 times per week.  Today's visit was #: 18 Starting weight: 281 lbs Starting date: 01/30/2017 Today's weight: 263 lbs Today's date: 10/07/2019 Total lbs lost to date: 18 Total lbs lost since last in-office visit: 2  Interim History: Nicole Rasmussen is doing well on the plan. She has lost 24 lbs since February 2021. This is her second stint in the program. She came to our clinic from Oct. 2018 to July 2019 and lost 9 lbs (272 lbs). She then restarted Feb of this year at 287 lbs. She is eating all of the prescribed protein. She uses snack calories.  Subjective:   1. Vitamin D deficiency Nicole Rasmussen's last Vit D was 58.56 on 08/02/2019. Her primary care physician took her off prescription Vit D.  2. Pre-diabetes Nicole Rasmussen has a diagnosis of pre-diabetes based on her elevated Hgb A1c and was informed this puts her at greater risk of developing diabetes. She notes some hunger and cravings occasionally. She continues to work on diet and exercise to decrease her risk of diabetes.   Lab Results  Component Value Date   HGBA1C 5.9 (H) 02/09/2017   Lab Results  Component Value Date   INSULIN 13.1 06/26/2019   INSULIN 9.5 02/09/2017   Assessment/Plan:   1. Vitamin D deficiency Low Vitamin D level contributes to fatigue and are associated with obesity, breast, and colon cancer. [place] agreed to start OTC Vit D3 2,000 IU daily and will follow-up for routine testing of Vitamin D, at least 2-3 times per year to avoid over-replacement.  2. Pre-diabetes Nicole Rasmussen will continue her meal plan, and will continue to work on weight loss, exercise, and decreasing simple carbohydrates to help decrease the risk of diabetes.     3. Class 3 severe obesity with serious comorbidity and body mass index (BMI) of 40.0 to 44.9 in adult, unspecified obesity type Nicole Rasmussen) Nicole Rasmussen is currently in the action stage of change. As such, her goal is to continue with weight loss efforts. She has agreed to the Category 3 Plan.   Handouts given today: Recipes, and Lunch Options.  Exercise goals: As is.  Behavioral modification strategies: increasing lean protein intake and decreasing simple carbohydrates.  Cadince has agreed to follow-up with our clinic in 3 weeks. She was informed of the importance of frequent follow-up visits to maximize her success with intensive lifestyle modifications for her multiple health conditions.   Objective:   Blood pressure 121/83, pulse (!) 102, temperature 98 F (36.7 C), temperature source Oral, height 5\' 8"  (1.727 m), weight 263 lb (119.3 kg), last menstrual period 10/11/2010, SpO2 98 %. Body mass index is 39.99 kg/m.  General: Cooperative, alert, well developed, in no acute distress. HEENT: Conjunctivae and lids unremarkable. Cardiovascular: Regular rhythm.  Lungs: Normal work of breathing. Neurologic: No focal deficits.   Lab Results  Component Value Date   CREATININE 0.80 04/25/2019   BUN 11 04/25/2019   NA 142 04/25/2019   K 3.6 04/25/2019   CL 109 04/25/2019   CO2 25 04/25/2019   Lab Results  Component Value Date   ALT 24 02/09/2017   AST 17 02/09/2017   ALKPHOS 85 02/09/2017   BILITOT 0.4 02/09/2017  Lab Results  Component Value Date   HGBA1C 5.9 (H) 02/09/2017   Lab Results  Component Value Date   INSULIN 13.1 06/26/2019   INSULIN 9.5 02/09/2017   Lab Results  Component Value Date   TSH 2.500 06/26/2019   Lab Results  Component Value Date   CHOL 216 (H) 02/09/2017   HDL 43 02/09/2017   LDLCALC 160 (H) 02/09/2017   TRIG 66 02/09/2017   Lab Results  Component Value Date   WBC 8.8 04/25/2019   HGB 13.2 04/25/2019   HCT 41.1 04/25/2019   MCV 85.3 04/25/2019    PLT 173 04/25/2019   No results found for: IRON, TIBC, FERRITIN  Attestation Statements:   Reviewed by clinician on day of visit: allergies, medications, problem list, medical history, surgical history, family history, social history, and previous encounter notes.   Nicole Rasmussen, am acting as Location manager for [employer], FNP-C.  I have reviewed the above documentation for accuracy and completeness, and I agree with the above. -  Nicole Fick, FNP

## 2019-10-09 ENCOUNTER — Encounter (INDEPENDENT_AMBULATORY_CARE_PROVIDER_SITE_OTHER): Payer: Self-pay | Admitting: Family Medicine

## 2019-10-09 MED ORDER — VITAMIN D 50 MCG (2000 UT) PO CAPS: 1.0000 | ORAL_CAPSULE | Freq: Every day | ORAL | 0 refills | Status: DC

## 2019-10-11 ENCOUNTER — Ambulatory Visit: Payer: Self-pay

## 2019-10-11 ENCOUNTER — Other Ambulatory Visit: Payer: Self-pay

## 2019-10-11 ENCOUNTER — Ambulatory Visit: Payer: 59 | Admitting: Orthopaedic Surgery

## 2019-10-11 ENCOUNTER — Encounter: Payer: Self-pay | Admitting: Orthopaedic Surgery

## 2019-10-11 DIAGNOSIS — M545 Low back pain, unspecified: Secondary | ICD-10-CM

## 2019-10-11 DIAGNOSIS — Z6841 Body Mass Index (BMI) 40.0 and over, adult: Secondary | ICD-10-CM | POA: Diagnosis not present

## 2019-10-11 DIAGNOSIS — M25552 Pain in left hip: Secondary | ICD-10-CM

## 2019-10-11 DIAGNOSIS — M25551 Pain in right hip: Secondary | ICD-10-CM

## 2019-10-11 MED ORDER — DICLOFENAC SODIUM 75 MG PO TBEC: 75.0000 mg | DELAYED_RELEASE_TABLET | Freq: Two times a day (BID) | ORAL | 2 refills | Status: DC

## 2019-10-11 NOTE — Progress Notes (Signed)
Office Visit Note   Patient: Nicole Rasmussen           Date of Birth: 1960-06-17           MRN: [id number] Visit Date: 10/11/2019              Requested by: Glendon Axe, MD [place],  [place] [id number] PCP: Glendon Axe, MD   Assessment & Plan: Visit Diagnoses:  1. Low back pain, unspecified back pain laterality, unspecified chronicity, unspecified whether sciatica present   2. Bilateral hip pain   3. Body mass index 40.0-44.9, adult ([place])   4. Morbid obesity ([place])     Plan: Impression is chronic low back pain.  We discussed the importance of weight loss and core strengthening.  She would like to try diclofenac.  We made a referral for PT and home exercises.  Questions encouraged and answered.  Follow-up as needed.  Follow-Up Instructions: Return if symptoms worsen or fail to improve.   Orders:  Orders Placed This Encounter  Procedures  . XR HIPS BILAT W OR W/O PELVIS 3-4 VIEWS  . XR Lumbar Spine 2-3 Views  . Ambulatory referral to Physical Therapy   Meds ordered this encounter  Medications  . diclofenac (VOLTAREN) 75 MG EC tablet    Sig: Take 1 tablet (75 mg total) by mouth 2 (two) times daily.    Dispense:  30 tablet    Refill:  2      Procedures: No procedures performed   Clinical Data: No additional findings.   Subjective: Chief Complaint  Patient presents with  . Right Hip - Pain  . Left Hip - Pain  . 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  She has occasional radiculopathy and occasional groin hip pain but mainly low back pain.  She has been taking on and off Flexeril, gabapentin, Tylenol, meloxicam.  Denies any constitutional symptoms or focal bowel or bladder dysfunction.  Denies any injuries.   Review of Systems  Constitutional: Negative.   HENT: Negative.   Eyes: Negative.   Respiratory: Negative.     Cardiovascular: Negative.   Endocrine: Negative.   Musculoskeletal: Negative.   Neurological: Negative.   Hematological: Negative.   Psychiatric/Behavioral: Negative.   All other systems reviewed and are negative.    Objective: Vital Signs: LMP 10/11/2010   Physical Exam Vitals and nursing note reviewed.  Constitutional:      Appearance: She is well-developed.  HENT:     Head: Normocephalic and atraumatic.  Pulmonary:     Effort: Pulmonary effort is normal.  Abdominal:     Palpations: Abdomen is soft.  Musculoskeletal:     Cervical back: Neck supple.  Skin:    General: Skin is warm.     Capillary Refill: Capillary refill takes less than 2 seconds.  Neurological:     Mental Status: She is alert and oriented to person, place, and time.  Psychiatric:        Behavior: Behavior normal.        Thought Content: Thought content normal.        Judgment: Judgment normal.     Ortho Exam Low back is tender to palpation.  Hip exams are relatively benign. Specialty Comments:  No specialty comments available.  Imaging: XR HIPS BILAT W OR W/O PELVIS 3-4 VIEWS  Result Date:  10/11/2019 Moderate degenerative changes of the right hip joint  XR Lumbar Spine 2-3 Views  Result Date: 10/11/2019 Lumbar spondylosis with a grade 1 spondylolisthesis of L5-S1    PMFS History: Patient Active Problem List   Diagnosis Date Noted  . Prediabetes 08/26/2019  . Class 3 severe obesity with serious comorbidity and body mass index (BMI) of 40.0 to 44.9 in adult ([place]) 07/11/2019  . Recurrent sinusitis 10/19/2018  . Other fatigue 02/09/2017  . Shortness of breath on exertion 02/09/2017  . Hyperglycemia 02/09/2017  . Vitamin D deficiency 02/09/2017  . Dysesthesia 01/27/2016  . 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 Lumbosacral radiculopathy at L5 01/27/2016  . Headache 06/08/2013  . Atypical chest pain 06/08/2013  . HYPERCHOLESTEROLEMIA 09/17/2007   . ARTHRITIS 09/17/2007  . HEARTBURN 09/17/2007   Past Medical History:  Diagnosis Date  . Acid reflux   . Allergy   . Anemia   . Arthritis   . Asthma    no inhalers  . B12 deficiency   . Dysmetabolic syndrome X   . Dyspnea   . Food allergy    shellfish  . GERD (gastroesophageal reflux disease)   . HLD (hyperlipidemia)   . Joint pain    knees and back  . Lower back pain   . Nerve pain    top of right thigh  . Neuromuscular disorder ([place])    bilateral leg pain from back issue  . Prediabetes   . Shortness of breath   . Sinus problem   . Swallowing difficulty   . Vitamin B 12 deficiency   . Vitamin D deficiency     Family History  Problem Relation Age of Onset  . Cancer Mother   . Hypertension Mother   . Hyperlipidemia Mother   . Heart disease Mother   . Breast cancer Mother   . Cancer Father   . Kidney disease Father   . Prostate cancer Father   . Hyperlipidemia Sister   . Diabetes Sister   . Hypertension Sister   . Hyperlipidemia Brother   . Hypertension Brother   . Prostate cancer Brother   . Hypertension Sister   . Hypertension Sister   . Hypertension Sister   . Hypertension Brother   . Hypertension Brother   . Hypertension Brother     Past Surgical History:  Procedure Laterality Date  . CESAREAN SECTION     1 time  . FOOT SURGERY Bilateral 1980's   bunionectomy and hammer toe on both feet  . MOUTH SURGERY  2011   had implants/ root canal/bridges  . WISDOM TOOTH EXTRACTION     with the implants   Social History   Occupational History  . Occupation: Retired, Engineer, maintenance: [employer]  Tobacco Use  . Smoking status: Former Smoker    Packs/day: 1.00    Years: 20.00    Pack years: 20.00    Types: Cigarettes    Quit date: 05/02/1996    Years since quitting: 23.4  . Smokeless tobacco: Never Used  Vaping Use  . Vaping Use: Never used  Substance and Sexual Activity  . Alcohol use: Yes    Comment: social  . Drug use: No  .  Sexual activity: Not on file

## 2019-10-18 ENCOUNTER — Ambulatory Visit: Payer: 59 | Admitting: Rehabilitative and Restorative Service Providers"

## 2019-10-29 ENCOUNTER — Ambulatory Visit (INDEPENDENT_AMBULATORY_CARE_PROVIDER_SITE_OTHER): Payer: 59 | Admitting: Family Medicine

## 2019-10-29 ENCOUNTER — Other Ambulatory Visit: Payer: Self-pay

## 2019-10-29 ENCOUNTER — Encounter (INDEPENDENT_AMBULATORY_CARE_PROVIDER_SITE_OTHER): Payer: Self-pay | Admitting: Family Medicine

## 2019-10-29 VITALS — BP 120/85 | HR 86 | Temp 98.2°F | Ht 68.0 in | Wt 264.0 lb

## 2019-10-29 DIAGNOSIS — R7303 Prediabetes: Secondary | ICD-10-CM | POA: Diagnosis not present

## 2019-10-29 DIAGNOSIS — Z6841 Body Mass Index (BMI) 40.0 and over, adult: Secondary | ICD-10-CM | POA: Diagnosis not present

## 2019-10-30 ENCOUNTER — Encounter (INDEPENDENT_AMBULATORY_CARE_PROVIDER_SITE_OTHER): Payer: Self-pay | Admitting: Family Medicine

## 2019-10-30 NOTE — Progress Notes (Signed)
Chief Complaint:   OBESITY Nicole Rasmussen is here to discuss her progress with her obesity treatment plan along with follow-up of her obesity related diagnoses. Nicole Rasmussen is on the Category 3 Plan and states she is following her eating plan approximately 80% of the time. Nicole Rasmussen states she is walking for 60 minutes 2-5 times per week.  Today's visit was #: 44 Starting weight: 281 lbs Starting date: 01/30/2017 Today's weight: 264 lbs Today's date: 10/29/2019 Total lbs lost to date: 17 Total lbs lost since last in-office visit: 0  Interim History: [name] notes having several celebrations over the past few weeks. She tried to make good choices.  Subjective:   1. Pre-diabetes Nicole Rasmussen has a diagnosis of pre-diabetes based on her elevated Hgb A1c and was informed this puts her at greater risk of developing diabetes. Last A1c was round 6.1. She is not on metformin and she denies polyphagia. She notes cravings. She continues to work on diet and exercise to decrease her risk of diabetes. She denies nausea or hypoglycemia.  Lab Results  Component Value Date   HGBA1C 5.9 (H) 02/09/2017   Lab Results  Component Value Date   INSULIN 13.1 06/26/2019   INSULIN 9.5 02/09/2017   Assessment/Plan:   1. Pre-diabetes Nicole Rasmussen will continue her meal plan, and will continue to work on weight loss, exercise, and decreasing simple carbohydrates to help decrease the risk of diabetes. She will bring her labs to her next office visit.   2. Class 3 severe obesity with serious comorbidity and body mass index (BMI) of 40.0 to 44.9 in adult, unspecified obesity type [facility]) Nicole Rasmussen is currently in the action stage of change. As such, her goal is to continue with weight loss efforts. She has agreed to the Category 3 Plan and keeping a food journal and adhering to recommended goals of 450-600 calories and 40 grams of protein at supper daily.   Handouts given today: Eating Out, and Journaling.  Exercise goals: As is.  Behavioral  modification strategies: increasing lean protein intake, decreasing simple carbohydrates, celebration eating strategies and planning for success.  Nicole Rasmussen has agreed to follow-up with our clinic in 2 weeks. She was informed of the importance of frequent follow-up visits to maximize her success with intensive lifestyle modifications for her multiple health conditions.   Objective:   Blood pressure 120/85, pulse 86, temperature 98.2 F (36.8 C), temperature source Oral, height 5\' 8"  (1.727 m), weight 264 lb (119.7 kg), last menstrual period 10/11/2010, SpO2 99 %. Body mass index is 40.14 kg/m.  General: Cooperative, alert, well developed, in no acute distress. HEENT: Conjunctivae and lids unremarkable. Cardiovascular: Regular rhythm.  Lungs: Normal work of breathing. Neurologic: No focal deficits.   Lab Results  Component Value Date   CREATININE 0.80 04/25/2019   BUN 11 04/25/2019   NA 142 04/25/2019   K 3.6 04/25/2019   CL 109 04/25/2019   CO2 25 04/25/2019   Lab Results  Component Value Date   ALT 24 02/09/2017   AST 17 02/09/2017   ALKPHOS 85 02/09/2017   BILITOT 0.4 02/09/2017   Lab Results  Component Value Date   HGBA1C 5.9 (H) 02/09/2017   Lab Results  Component Value Date   INSULIN 13.1 06/26/2019   INSULIN 9.5 02/09/2017   Lab Results  Component Value Date   TSH 2.500 06/26/2019   Lab Results  Component Value Date   CHOL 216 (H) 02/09/2017   HDL 43 02/09/2017   LDLCALC 160 (H) 02/09/2017  TRIG 66 02/09/2017   Lab Results  Component Value Date   WBC 8.8 04/25/2019   HGB 13.2 04/25/2019   HCT 41.1 04/25/2019   MCV 85.3 04/25/2019   PLT 173 04/25/2019   No results found for: IRON, TIBC, FERRITIN  Attestation Statements:   Reviewed by clinician on day of visit: allergies, medications, problem list, medical history, surgical history, family history, social history, and previous encounter notes.   Wilhemena Durie, am acting as Location manager for  [employer], FNP-C.  I have reviewed the above documentation for accuracy and completeness, and I agree with the above. -  Georgianne Fick, FNP

## 2019-11-06 ENCOUNTER — Ambulatory Visit: Payer: 59 | Admitting: Pulmonary Disease

## 2019-11-12 ENCOUNTER — Ambulatory Visit (INDEPENDENT_AMBULATORY_CARE_PROVIDER_SITE_OTHER): Payer: 59 | Admitting: Family Medicine

## 2019-11-12 ENCOUNTER — Other Ambulatory Visit: Payer: Self-pay

## 2019-11-12 ENCOUNTER — Encounter (INDEPENDENT_AMBULATORY_CARE_PROVIDER_SITE_OTHER): Payer: Self-pay | Admitting: Family Medicine

## 2019-11-12 VITALS — BP 119/80 | HR 75 | Temp 97.9°F | Ht 68.0 in | Wt 260.0 lb

## 2019-11-12 DIAGNOSIS — R7303 Prediabetes: Secondary | ICD-10-CM | POA: Diagnosis not present

## 2019-11-12 DIAGNOSIS — Z6839 Body mass index (BMI) 39.0-39.9, adult: Secondary | ICD-10-CM

## 2019-11-13 ENCOUNTER — Encounter (INDEPENDENT_AMBULATORY_CARE_PROVIDER_SITE_OTHER): Payer: Self-pay

## 2019-11-14 ENCOUNTER — Encounter (INDEPENDENT_AMBULATORY_CARE_PROVIDER_SITE_OTHER): Payer: Self-pay | Admitting: Family Medicine

## 2019-11-14 NOTE — Progress Notes (Signed)
Chief Complaint:   OBESITY Nicole Rasmussen is here to discuss her progress with her obesity treatment plan along with follow-up of her obesity related diagnoses. Nicole Rasmussen is on the Category 3 Plan and keeping a food journal and adhering to recommended goals of 450-600 calories and 40 grams of protein at supper daily and states she is following her eating plan approximately 85-90% of the time. Nicole Rasmussen states she is walking for 60 minutes 2 times per week.  Today's visit was #: 20 Starting weight: 281 lbs Starting date: 01/30/2017 Today's weight: 260 lbs Today's date: 11/12/2019 Total lbs lost to date: 21 Total lbs lost since last in-office visit: 4  Interim History: Nicole Rasmussen did well on the plan over the 4th of July and she is proud of that. She has lost 21 lbs since starting back on the program in February 24th 2021. She is weighing her meat and getting her protein in. She has a protein shake for breakfast, lunch is a frozen meal, and dinner is salad.  Subjective:   1. Pre-diabetes Nicole Rasmussen has a diagnosis of pre-diabetes based on her elevated Hgb A1c and was informed this puts her at greater risk of developing diabetes. Last A1c was 6.3 on 08/20/2019. She does not want to take metformin. Her primary care physician will recheck her labs within a month. She continues to work on diet and exercise to decrease her risk of diabetes. She denies nausea or hypoglycemia.  Lab Results  Component Value Date   HGBA1C 6.3 08/20/2019   Lab Results  Component Value Date   INSULIN 13.1 06/26/2019   INSULIN 9.5 02/09/2017   Assessment/Plan:   1. Pre-diabetes Nicole Rasmussen will continue metformin, and will continue to work on weight loss, exercise, and decreasing simple carbohydrates to help decrease the risk of diabetes.   2. Class 2 severe obesity with serious comorbidity and body mass index (BMI) of 39.0 to 39.9 in adult, unspecified obesity type [facility]) Nicole Rasmussen is currently in the action stage of change. As such, her goal  is to continue with weight loss efforts. She has agreed to the Category 3 Plan.   Exercise goals: Nicole Rasmussen will increase walking to 150 minutes per week.  Behavioral modification strategies: planning for success.  Nicole Rasmussen has agreed to follow-up with our clinic in 2 weeks with Dr. Raliegh Scarlet. She was informed of the importance of frequent follow-up visits to maximize her success with intensive lifestyle modifications for her multiple health conditions.   Objective:   Blood pressure 119/80, pulse 75, temperature 97.9 F (36.6 C), temperature source Oral, height 5\' 8"  (1.727 m), weight 260 lb (117.9 kg), last menstrual period 10/11/2010, SpO2 96 %. Body mass index is 39.53 kg/m.  General: Cooperative, alert, well developed, in no acute distress. HEENT: Conjunctivae and lids unremarkable. Cardiovascular: Regular rhythm.  Lungs: Normal work of breathing. Neurologic: No focal deficits.   Lab Results  Component Value Date   CREATININE 0.9 08/20/2019   BUN 19 08/20/2019   NA 142 04/25/2019   K 3.6 04/25/2019   CL 109 04/25/2019   CO2 25 04/25/2019   Lab Results  Component Value Date   ALT 38 (A) 08/20/2019   AST 27 08/20/2019   ALKPHOS 59 08/20/2019   BILITOT 0.4 02/09/2017   Lab Results  Component Value Date   HGBA1C 6.3 08/20/2019   HGBA1C 5.9 (H) 02/09/2017   Lab Results  Component Value Date   INSULIN 13.1 06/26/2019   INSULIN 9.5 02/09/2017   Lab Results  Component Value Date   TSH 1.44 08/20/2019   Lab Results  Component Value Date   CHOL 163 08/20/2019   HDL 48 08/20/2019   LDLCALC 103 08/20/2019   TRIG 60 08/20/2019   Lab Results  Component Value Date   WBC 8.8 04/25/2019   HGB 13.2 04/25/2019   HCT 41.1 04/25/2019   MCV 85.3 04/25/2019   PLT 173 04/25/2019   Lab Results  Component Value Date   FERRITIN 167.40 08/20/2019   Attestation Statements:   Reviewed by clinician on day of visit: allergies, medications, problem list, medical history, surgical  history, family history, social history, and previous encounter notes.   Wilhemena Durie, am acting as Location manager for [employer], FNP-C.  I have reviewed the above documentation for accuracy and completeness, and I agree with the above. -  Georgianne Fick, FNP

## 2019-11-20 ENCOUNTER — Telehealth (HOSPITAL_COMMUNITY): Payer: Self-pay | Admitting: Pulmonary Disease

## 2019-11-20 NOTE — Telephone Encounter (Signed)
Previously scheduled upcoming CT canceled  CT from 09/10/2019 reviewed showing stable lung nodule-9 mm lung nodule-dates back to 2019  Schedule for repeat in 1 year  Patient has a follow-up appointment July 2021

## 2019-11-20 NOTE — Telephone Encounter (Signed)
LMTCB Pt has f/u on 11/25/19.

## 2019-11-21 NOTE — Telephone Encounter (Signed)
Spoke with the pt and notified of response per Dr Ander Slade  Pt verbalized understanding

## 2019-11-21 NOTE — Telephone Encounter (Signed)
Patient is returning phone call. Patient phone number is [phone].

## 2019-11-25 ENCOUNTER — Ambulatory Visit: Payer: 59 | Admitting: Pulmonary Disease

## 2019-12-04 ENCOUNTER — Encounter (INDEPENDENT_AMBULATORY_CARE_PROVIDER_SITE_OTHER): Payer: Self-pay | Admitting: Family Medicine

## 2019-12-04 ENCOUNTER — Ambulatory Visit (INDEPENDENT_AMBULATORY_CARE_PROVIDER_SITE_OTHER): Payer: 59 | Admitting: Family Medicine

## 2019-12-04 ENCOUNTER — Other Ambulatory Visit: Payer: Self-pay

## 2019-12-04 VITALS — BP 129/86 | HR 76 | Temp 97.9°F | Ht 68.0 in | Wt 264.0 lb

## 2019-12-04 DIAGNOSIS — Z6841 Body Mass Index (BMI) 40.0 and over, adult: Secondary | ICD-10-CM | POA: Diagnosis not present

## 2019-12-04 DIAGNOSIS — R7303 Prediabetes: Secondary | ICD-10-CM

## 2019-12-04 NOTE — Progress Notes (Signed)
Chief Complaint:   OBESITY Nicole Rasmussen is here to discuss her progress with her obesity treatment plan along with follow-up of her obesity related diagnoses. Nicole Rasmussen is on the Category 3 Plan and states she is following her eating plan approximately 75% of the time. Nicole Rasmussen states she is doing 0 minutes 0 times per week.  Today's visit was #: 21 Starting weight: 281 lbs Starting date: 01/30/2017 Today's weight: 264 lbs Today's date: 12/04/2019 Total lbs lost to date: 17 Total lbs lost since last in-office visit: 0  Interim History: Nicole Rasmussen got back from vacation yesterday and she reports she is getting back on the plan today. She has already been grocery shopping for her plan.  She is motivated to continue with weight loss.  She struggles with back and leg pain which sometimes prevents exercises.  Subjective:   1. Pre-diabetes Nicole Rasmussen has a diagnosis of pre-diabetes based on her elevated Hgb A1c and was informed this puts her at greater risk of developing diabetes. Last A1c was 6.3. She has family history of diabetes mellitus, and she is not on metformin. She continues to work on diet and exercise to decrease her risk of diabetes.   Lab Results  Component Value Date   HGBA1C 6.3 08/20/2019   Lab Results  Component Value Date   INSULIN 13.1 06/26/2019   INSULIN 9.5 02/09/2017   Assessment/Plan:   1. Pre-diabetes Nicole Rasmussen will continue to work on weight loss, exercise, and decreasing simple carbohydrates to help decrease the risk of diabetes. Meela will see her primary care physician on 12/04/2019 and will bring [name] a copy of her labs.  2. Class 3 severe obesity with serious comorbidity and body mass index (BMI) of 40.0 to 44.9 in adult, unspecified obesity type Nicole Rasmussen) Danielle is currently in the action stage of change. As such, her goal is to continue with weight loss efforts. She has agreed to the Category 3 Plan.   Exercise goals: Walk as tolerated.  Behavioral modification strategies:  increasing lean protein intake and decreasing simple carbohydrates.  Adel has agreed to follow-up with our clinic in 3 weeks. She was informed of the importance of frequent follow-up visits to maximize her success with intensive lifestyle modifications for her multiple health conditions.   Objective:   Blood pressure 129/86, pulse 76, temperature 97.9 F (36.6 C), temperature source Oral, height 5\' 8"  (1.727 m), weight 264 lb (119.7 kg), last menstrual period 10/11/2010, SpO2 98 %. Body mass index is 40.14 kg/m.  General: Cooperative, alert, well developed, in no acute distress. HEENT: Conjunctivae and lids unremarkable. Cardiovascular: Regular rhythm.  Lungs: Normal work of breathing. Neurologic: No focal deficits.   Lab Results  Component Value Date   CREATININE 0.9 08/20/2019   BUN 19 08/20/2019   NA 142 04/25/2019   K 3.6 04/25/2019   CL 109 04/25/2019   CO2 25 04/25/2019   Lab Results  Component Value Date   ALT 38 (A) 08/20/2019   AST 27 08/20/2019   ALKPHOS 59 08/20/2019   BILITOT 0.4 02/09/2017   Lab Results  Component Value Date   HGBA1C 6.3 08/20/2019   HGBA1C 5.9 (H) 02/09/2017   Lab Results  Component Value Date   INSULIN 13.1 06/26/2019   INSULIN 9.5 02/09/2017   Lab Results  Component Value Date   TSH 1.44 08/20/2019   Lab Results  Component Value Date   CHOL 163 08/20/2019   HDL 48 08/20/2019   LDLCALC 103 08/20/2019   TRIG 60  08/20/2019   Lab Results  Component Value Date   WBC 8.8 04/25/2019   HGB 13.2 04/25/2019   HCT 41.1 04/25/2019   MCV 85.3 04/25/2019   PLT 173 04/25/2019   Lab Results  Component Value Date   FERRITIN 167.40 08/20/2019   Attestation Statements:   Reviewed by clinician on day of visit: allergies, medications, problem list, medical history, surgical history, family history, social history, and previous encounter notes.   Wilhemena Durie, am acting as Location manager for [employer], FNP-C.  I have  reviewed the above documentation for accuracy and completeness, and I agree with the above. -  Georgianne Fick, FNP

## 2019-12-25 ENCOUNTER — Ambulatory Visit (INDEPENDENT_AMBULATORY_CARE_PROVIDER_SITE_OTHER): Payer: 59 | Admitting: Family Medicine

## 2020-01-14 ENCOUNTER — Ambulatory Visit (INDEPENDENT_AMBULATORY_CARE_PROVIDER_SITE_OTHER): Payer: 59 | Admitting: Family Medicine

## 2020-01-16 ENCOUNTER — Other Ambulatory Visit: Payer: Self-pay

## 2020-01-16 ENCOUNTER — Ambulatory Visit (INDEPENDENT_AMBULATORY_CARE_PROVIDER_SITE_OTHER): Payer: 59 | Admitting: Family Medicine

## 2020-01-16 ENCOUNTER — Encounter (INDEPENDENT_AMBULATORY_CARE_PROVIDER_SITE_OTHER): Payer: Self-pay | Admitting: Family Medicine

## 2020-01-16 VITALS — BP 117/80 | HR 78 | Temp 97.9°F | Ht 68.0 in | Wt 262.0 lb

## 2020-01-16 DIAGNOSIS — Z6839 Body mass index (BMI) 39.0-39.9, adult: Secondary | ICD-10-CM | POA: Diagnosis not present

## 2020-01-16 DIAGNOSIS — R7303 Prediabetes: Secondary | ICD-10-CM | POA: Diagnosis not present

## 2020-01-20 ENCOUNTER — Encounter (INDEPENDENT_AMBULATORY_CARE_PROVIDER_SITE_OTHER): Payer: Self-pay | Admitting: Family Medicine

## 2020-01-20 NOTE — Progress Notes (Signed)
Chief Complaint:   OBESITY Trenell is here to discuss her progress with her obesity treatment plan along with follow-up of her obesity related diagnoses. Hatsuko is on the Category 3 Plan and states she is following her eating plan approximately 0% of the time. [place] states she is doing 0 minutes 0 times per week.  Today's visit was #: 22 Starting weight: 281 lbs Starting date: 01/30/2017 Today's weight: 262 lbs Today's date: 01/16/2020 Total lbs lost to date: 19 Total lbs lost since last in-office visit: 2  Interim History: Lavere is surprised she lost weight. Her daughter has been ill with COVID. Her husband has been in the hospital due to heart issues. She has been off the plan. However, she has lost 2 lbs and is down 25 lbs since 06/26/2019. She has right back and leg pain which prevents exercise at this point.  Subjective:   1. Pre-diabetes Lameeka has a diagnosis of pre-diabetes based on her elevated Hgb A1c. Maygan had labs done in August and repeat A1c which was 6.3. She will bring in her labs to her next office visit. .  Lab Results  Component Value Date   HGBA1C 6.3 08/20/2019   Lab Results  Component Value Date   INSULIN 13.1 06/26/2019   INSULIN 9.5 02/09/2017   Assessment/Plan:   1. Pre-diabetes Taina will continue her meal plan, and will continue to work on weight loss, exercise, and decreasing simple carbohydrates to help decrease the risk of diabetes.   2. Class 2 severe obesity with serious comorbidity and body mass index (BMI) of 39.0 to 39.9 in adult, unspecified obesity type [facility]) Hodan is currently in the action stage of change. As such, her goal is to continue with weight loss efforts. She has agreed to the Category 3 Plan.   Exercise goals: All adults should avoid inactivity. Some physical activity is better than none, and adults who participate in any amount of physical activity gain some health benefits.  Behavioral modification strategies: increasing lean  protein intake and decreasing simple carbohydrates.  Yatzil has agreed to follow-up with our clinic in 3 weeks.   Objective:   Blood pressure 117/80, pulse 78, temperature 97.9 F (36.6 C), temperature source Oral, height 5\' 8"  (1.727 m), weight 262 lb (118.8 kg), last menstrual period 10/11/2010, SpO2 100 %. Body mass index is 39.84 kg/m.  General: Cooperative, alert, well developed, in no acute distress. HEENT: Conjunctivae and lids unremarkable. Cardiovascular: Regular rhythm.  Lungs: Normal work of breathing. Neurologic: No focal deficits.   Lab Results  Component Value Date   CREATININE 0.9 08/20/2019   BUN 19 08/20/2019   NA 142 04/25/2019   K 3.6 04/25/2019   CL 109 04/25/2019   CO2 25 04/25/2019   Lab Results  Component Value Date   ALT 38 (A) 08/20/2019   AST 27 08/20/2019   ALKPHOS 59 08/20/2019   BILITOT 0.4 02/09/2017   Lab Results  Component Value Date   HGBA1C 6.3 08/20/2019   HGBA1C 5.9 (H) 02/09/2017   Lab Results  Component Value Date   INSULIN 13.1 06/26/2019   INSULIN 9.5 02/09/2017   Lab Results  Component Value Date   TSH 1.44 08/20/2019   Lab Results  Component Value Date   CHOL 163 08/20/2019   HDL 48 08/20/2019   LDLCALC 103 08/20/2019   TRIG 60 08/20/2019   Lab Results  Component Value Date   WBC 8.8 04/25/2019   HGB 13.2 04/25/2019   HCT  41.1 04/25/2019   MCV 85.3 04/25/2019   PLT 173 04/25/2019   Lab Results  Component Value Date   FERRITIN 167.40 08/20/2019   Attestation Statements:   Reviewed by clinician on day of visit: allergies, medications, problem list, medical history, surgical history, family history, social history, and previous encounter notes.   Wilhemena Durie, am acting as Location manager for [employer], FNP-C.  I have reviewed the above documentation for accuracy and completeness, and I agree with the above. -  Georgianne Fick, FNP

## 2020-02-06 ENCOUNTER — Encounter (INDEPENDENT_AMBULATORY_CARE_PROVIDER_SITE_OTHER): Payer: Self-pay | Admitting: Family Medicine

## 2020-02-06 ENCOUNTER — Ambulatory Visit (INDEPENDENT_AMBULATORY_CARE_PROVIDER_SITE_OTHER): Payer: 59 | Admitting: Family Medicine

## 2020-02-06 ENCOUNTER — Other Ambulatory Visit: Payer: Self-pay

## 2020-02-06 VITALS — BP 121/84 | HR 93 | Temp 98.0°F | Ht 68.0 in | Wt 259.0 lb

## 2020-02-06 DIAGNOSIS — Z6839 Body mass index (BMI) 39.0-39.9, adult: Secondary | ICD-10-CM

## 2020-02-06 DIAGNOSIS — R7303 Prediabetes: Secondary | ICD-10-CM

## 2020-02-10 ENCOUNTER — Encounter (INDEPENDENT_AMBULATORY_CARE_PROVIDER_SITE_OTHER): Payer: Self-pay | Admitting: Family Medicine

## 2020-02-10 NOTE — Progress Notes (Signed)
Chief Complaint:   OBESITY Nicole Rasmussen is here to discuss her progress with her obesity treatment plan along with follow-up of her obesity related diagnoses. Nicole Rasmussen is on the Category 3 Plan and states she is following her eating plan approximately 50% of the time. Nicole Rasmussen states she is doing 0 minutes 0 times per week.  Today's visit was #: 23 Starting weight: 281 lbs Starting date: 01/30/2017 Today's weight: 259 lbs Today's date: 02/06/2020 Total lbs lost to date: 22 Total lbs lost since last in-office visit: 3  Interim History: Nicole Rasmussen is surprised that she is down 3 lbs today. She has been missing some meals and protein. She has been off work and notes she eats better when she works. She is unable to exercise due to back and leg pain.  Subjective:   1. Pre-diabetes . Last A1c was 6.03 December 2019. She is not on metformin.  Lab Results  Component Value Date   HGBA1C 6.3 08/20/2019   Lab Results  Component Value Date   INSULIN 13.1 06/26/2019   INSULIN 9.5 02/09/2017   Assessment/Plan:   1. Pre-diabetes Nicole Rasmussen declines metformin, and she will continue her meal plan.  2. Class 2 severe obesity with serious comorbidity and body mass index (BMI) of 39.0 to 39.9 in adult, unspecified obesity type Nicole Rasmussen) Nicole Rasmussen is currently in the action stage of change. As such, her goal is to continue with weight loss efforts. She has agreed to the Category 3 Plan.   Nicole Rasmussen will try to get 80 grams of protein daily.  Exercise goals: No exercise has been prescribed at this time.  Behavioral modification strategies: increasing lean protein intake.  Nicole Rasmussen has agreed to follow-up with our clinic in 3 weeks.  Objective:   Blood pressure 121/84, pulse 93, temperature 98 F (36.7 C), temperature source Oral, height 5\' 8"  (1.727 m), weight 259 lb (117.5 kg), last menstrual period 10/11/2010, SpO2 99 %. Body mass index is 39.38 kg/m.  General: Cooperative, alert, well developed, in no acute  distress. HEENT: Conjunctivae and lids unremarkable. Cardiovascular: Regular rhythm.  Lungs: Normal work of breathing. Neurologic: No focal deficits.   Lab Results  Component Value Date   CREATININE 0.9 08/20/2019   BUN 19 08/20/2019   NA 142 04/25/2019   K 3.6 04/25/2019   CL 109 04/25/2019   CO2 25 04/25/2019   Lab Results  Component Value Date   ALT 38 (A) 08/20/2019   AST 27 08/20/2019   ALKPHOS 59 08/20/2019   BILITOT 0.4 02/09/2017   Lab Results  Component Value Date   HGBA1C 6.3 08/20/2019   HGBA1C 5.9 (H) 02/09/2017   Lab Results  Component Value Date   INSULIN 13.1 06/26/2019   INSULIN 9.5 02/09/2017   Lab Results  Component Value Date   TSH 1.44 08/20/2019   Lab Results  Component Value Date   CHOL 163 08/20/2019   HDL 48 08/20/2019   LDLCALC 103 08/20/2019   TRIG 60 08/20/2019   Lab Results  Component Value Date   WBC 8.8 04/25/2019   HGB 13.2 04/25/2019   HCT 41.1 04/25/2019   MCV 85.3 04/25/2019   PLT 173 04/25/2019   Lab Results  Component Value Date   FERRITIN 167.40 08/20/2019   Attestation Statements:   Reviewed by clinician on day of visit: allergies, medications, problem list, medical history, surgical history, family history, social history, and previous encounter notes.   Wilhemena Durie, am acting as Location manager for [employer], FNP-C.  I have reviewed the above documentation for accuracy and completeness, and I agree with the above. -  Georgianne Fick, FNP

## 2020-02-27 ENCOUNTER — Ambulatory Visit (INDEPENDENT_AMBULATORY_CARE_PROVIDER_SITE_OTHER): Payer: 59 | Admitting: Family Medicine

## 2020-03-04 ENCOUNTER — Ambulatory Visit (INDEPENDENT_AMBULATORY_CARE_PROVIDER_SITE_OTHER): Payer: 59 | Admitting: Family Medicine

## 2020-03-10 ENCOUNTER — Other Ambulatory Visit: Payer: Self-pay

## 2020-03-10 ENCOUNTER — Ambulatory Visit: Payer: 59 | Admitting: Orthopaedic Surgery

## 2020-03-10 ENCOUNTER — Ambulatory Visit: Payer: Self-pay

## 2020-03-10 ENCOUNTER — Encounter: Payer: Self-pay | Admitting: Orthopaedic Surgery

## 2020-03-10 DIAGNOSIS — M1712 Unilateral primary osteoarthritis, left knee: Secondary | ICD-10-CM | POA: Diagnosis not present

## 2020-03-10 DIAGNOSIS — M1711 Unilateral primary osteoarthritis, right knee: Secondary | ICD-10-CM

## 2020-03-10 MED ORDER — LIDOCAINE HCL 1 % IJ SOLN
3.0000 mL | INTRAMUSCULAR | Status: AC | PRN
  Administered 2020-03-10: 3 mL

## 2020-03-10 MED ORDER — METHYLPREDNISOLONE ACETATE 40 MG/ML IJ SUSP
13.3300 mg | INTRAMUSCULAR | Status: AC | PRN
  Administered 2020-03-10: 13.33 mg via INTRA_ARTICULAR

## 2020-03-10 MED ORDER — BUPIVACAINE HCL 0.25 % IJ SOLN
0.6600 mL | INTRAMUSCULAR | Status: AC | PRN
  Administered 2020-03-10: .66 mL via INTRA_ARTICULAR

## 2020-03-10 NOTE — Progress Notes (Signed)
Office Visit Note   Patient: Nicole Rasmussen           Date of Birth: 04/04/1961           MRN: [id number] Visit Date: 03/10/2020              Requested by: Glendon Axe, MD [place],  [place] [address] PCP: Glendon Axe, MD   Assessment & Plan: Visit Diagnoses:  1. Primary osteoarthritis of left knee   2. Primary osteoarthritis of right knee     Plan: Impression is bilateral knee degenerative joint disease.  We injected both knees with cortisone today.  Have also provided her with a viscosupplementation handout.  She will follow up with [name] as needed.  Follow-Up Instructions: Return if symptoms worsen or fail to improve.   Orders:  Orders Placed This Encounter  Procedures  . Large Joint Inj: bilateral knee  . XR KNEE 3 VIEW RIGHT  . XR KNEE 3 VIEW LEFT   No orders of the defined types were placed in this encounter.     Procedures: Large Joint Inj: bilateral knee on 03/10/2020 3:56 PM Indications: pain Details: 22 G needle, anterolateral approach Medications (Right): 0.66 mL bupivacaine 0.25 %; 3 mL lidocaine 1 %; 13.33 mg methylPREDNISolone acetate 40 MG/ML Medications (Left): 0.66 mL bupivacaine 0.25 %; 3 mL lidocaine 1 %; 13.33 mg methylPREDNISolone acetate 40 MG/ML      Clinical Data: No additional findings.   Subjective: Chief Complaint  Patient presents with  . Right Knee - Pain  . Left Knee - Pain    HPI patient is a pleasant 59 year old female who comes in today with bilateral knee pain right greater than left for the past several years but worse over the past month.  The pain she has is to the entire aspect of the knees.  She has associated sharp shooting pain with certain motions.  Going up and down stairs as well as standing all day while at work seem to aggravate her symptoms most.  She also has pain that awakens her from her sleep.  She has been taking Tylenol, gabapentin and Mobic without significant relief of symptoms.  No previous  surgical intervention or cortisone injection to either knee.  Review of Systems as detailed in HPI.  All others reviewed and are negative.   Objective: Vital Signs: LMP 10/11/2010   Physical Exam well-developed well-nourished female no acute distress.  Alert and oriented x3.  Ortho Exam bilateral knee exam shows lateral joint line tenderness and moderate patellofemoral crepitus on the right.  Mild medial lateral joint line tenderness on the left.  She is stable valgus varus stress.  She is neurovascular intact distally.  Specialty Comments:  No specialty comments available.  Imaging: XR KNEE 3 VIEW LEFT  Result Date: 03/10/2020 Moderate medial compartment degenerative changes  XR KNEE 3 VIEW RIGHT  Result Date: 03/10/2020 Moderate tricompartmental degenerative changes    PMFS History: Patient Active Problem List   Diagnosis Date Noted  . Prediabetes 08/26/2019  . Class 2 severe obesity with serious comorbidity and body mass index (BMI) of 39.0 to 39.9 in adult ([place]) 07/11/2019  . Recurrent sinusitis 10/19/2018  . Other fatigue 02/09/2017  . Shortness of breath on exertion 02/09/2017  . Hyperglycemia 02/09/2017  . Vitamin D deficiency 02/09/2017  . Dysesthesia 01/27/2016  . 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 Lumbosacral radiculopathy at L5 01/27/2016  . Headache 06/08/2013  . Atypical chest  pain 06/08/2013  . HYPERCHOLESTEROLEMIA 09/17/2007  . ARTHRITIS 09/17/2007  . HEARTBURN 09/17/2007   Past Medical History:  Diagnosis Date  . Acid reflux   . Allergy   . Anemia   . Arthritis   . Asthma    no inhalers  . B12 deficiency   . Dysmetabolic syndrome X   . Dyspnea   . Food allergy    shellfish  . GERD (gastroesophageal reflux disease)   . HLD (hyperlipidemia)   . Joint pain    knees and back  . Lower back pain   . Nerve pain    top of right thigh  . Neuromuscular disorder ([place])    bilateral leg pain from back  issue  . Prediabetes   . Shortness of breath   . Sinus problem   . Swallowing difficulty   . Vitamin B 12 deficiency   . Vitamin D deficiency     Family History  Problem Relation Age of Onset  . Cancer Mother   . Hypertension Mother   . Hyperlipidemia Mother   . Heart disease Mother   . Breast cancer Mother   . Cancer Father   . Kidney disease Father   . Prostate cancer Father   . Hyperlipidemia Sister   . Diabetes Sister   . Hypertension Sister   . Hyperlipidemia Brother   . Hypertension Brother   . Prostate cancer Brother   . Hypertension Sister   . Hypertension Sister   . Hypertension Sister   . Hypertension Brother   . Hypertension Brother   . Hypertension Brother     Past Surgical History:  Procedure Laterality Date  . CESAREAN SECTION     1 time  . FOOT SURGERY Bilateral 1980's   bunionectomy and hammer toe on both feet  . MOUTH SURGERY  2011   had implants/ root canal/bridges  . WISDOM TOOTH EXTRACTION     with the implants   Social History   Occupational History  . Occupation: Retired, Engineer, maintenance: [employer]  Tobacco Use  . Smoking status: Former Smoker    Packs/day: 1.00    Years: 20.00    Pack years: 20.00    Types: Cigarettes    Quit date: 05/02/1996    Years since quitting: 23.8  . Smokeless tobacco: Never Used  Vaping Use  . Vaping Use: Never used  Substance and Sexual Activity  . Alcohol use: Yes    Comment: social  . Drug use: No  . Sexual activity: Not on file

## 2020-03-11 ENCOUNTER — Encounter: Payer: Self-pay | Admitting: Orthopaedic Surgery

## 2020-03-11 ENCOUNTER — Other Ambulatory Visit: Payer: Self-pay | Admitting: Orthopaedic Surgery

## 2020-03-11 ENCOUNTER — Ambulatory Visit (INDEPENDENT_AMBULATORY_CARE_PROVIDER_SITE_OTHER): Payer: 59 | Admitting: Family Medicine

## 2020-03-11 ENCOUNTER — Other Ambulatory Visit: Payer: Self-pay | Admitting: Radiology

## 2020-03-11 ENCOUNTER — Encounter (INDEPENDENT_AMBULATORY_CARE_PROVIDER_SITE_OTHER): Payer: Self-pay | Admitting: Family Medicine

## 2020-03-11 VITALS — BP 146/88 | HR 96 | Temp 98.0°F | Ht 68.0 in | Wt 273.0 lb

## 2020-03-11 DIAGNOSIS — M170 Bilateral primary osteoarthritis of knee: Secondary | ICD-10-CM | POA: Diagnosis not present

## 2020-03-11 DIAGNOSIS — Z6841 Body Mass Index (BMI) 40.0 and over, adult: Secondary | ICD-10-CM

## 2020-03-11 DIAGNOSIS — M1711 Unilateral primary osteoarthritis, right knee: Secondary | ICD-10-CM

## 2020-03-12 NOTE — Progress Notes (Signed)
Chief Complaint:   OBESITY Nicole Rasmussen is here to discuss her progress with her obesity treatment plan along with follow-up of her obesity related diagnoses. Nicole Rasmussen is on the Category 3 Plan and states she is following her eating plan approximately 30% of the time. Nicole Rasmussen states she is doing 0 minutes 0 times per week.  Today's visit was #: 24 Starting weight: 281 lbs Starting date: 01/30/2017 Today's weight: 273 lbs Today's date: 03/11/2020 Total lbs lost to date: 8 Total lbs lost since last in-office visit: 0  Interim History: Nicole Rasmussen is struggling with a sweet tooth. She has missed a few visits due to work schedule. She has been doing a lot of take out in the evening after work. She is up about 15 lbs of water per our bioimpedance scale today since her last office visit, but she was dehydrated last office visit. She admits to being off the plan quite a bit.  Subjective:   1. Osteoarthritis of right knee, unspecified osteoarthritis type Nicole Rasmussen is having swelling in right ankle and has osteoarthritis in both knees. She had  knee injections yesterday. She has been taking both Mobic and Voltaren.   Assessment/Plan:   1. Osteoarthritis of right knee, unspecified osteoarthritis type Nicole Rasmussen will try to elevate her leg as much as possible. She will stop Mobic and continue Voltaren, and she may take Tylenol for pain. Discussed problem with taking both of these and she voiced understanding. She is to alternate heat and cold.  2. Class 3 severe obesity with serious comorbidity and body mass index (BMI) of 40.0 to 44.9 in adult, unspecified obesity type [facility]) Nicole Rasmussen is currently in the action stage of change. As such, her goal is to continue with weight loss efforts. She has agreed to the Category 3 Plan.   Exercise goals: No exercise has been prescribed at this time.  Behavioral modification strategies: increasing lean protein intake, decreasing simple carbohydrates and better snacking  choices.  Nicole Rasmussen has agreed to follow-up with our clinic in 2 weeks.  Objective:   Blood pressure (!) 146/88, pulse 96, temperature 98 F (36.7 C), temperature source Oral, height 5\' 8"  (1.727 m), weight 273 lb (123.8 kg), last menstrual period 10/11/2010, SpO2 97 %. Body mass index is 41.51 kg/m.  General: Cooperative, alert, well developed, in no acute distress. HEENT: Conjunctivae and lids unremarkable. Cardiovascular: Regular rhythm.  Lungs: Normal work of breathing. Neurologic: No focal deficits.   Lab Results  Component Value Date   CREATININE 0.9 08/20/2019   BUN 19 08/20/2019   NA 142 04/25/2019   K 3.6 04/25/2019   CL 109 04/25/2019   CO2 25 04/25/2019   Lab Results  Component Value Date   ALT 38 (A) 08/20/2019   AST 27 08/20/2019   ALKPHOS 59 08/20/2019   BILITOT 0.4 02/09/2017   Lab Results  Component Value Date   HGBA1C 6.3 08/20/2019   HGBA1C 5.9 (H) 02/09/2017   Lab Results  Component Value Date   INSULIN 13.1 06/26/2019   INSULIN 9.5 02/09/2017   Lab Results  Component Value Date   TSH 1.44 08/20/2019   Lab Results  Component Value Date   CHOL 163 08/20/2019   HDL 48 08/20/2019   LDLCALC 103 08/20/2019   TRIG 60 08/20/2019   Lab Results  Component Value Date   WBC 8.8 04/25/2019   HGB 13.2 04/25/2019   HCT 41.1 04/25/2019   MCV 85.3 04/25/2019   PLT 173 04/25/2019   Lab Results  Component Value Date   FERRITIN 167.40 08/20/2019   Attestation Statements:   Reviewed by clinician on day of visit: allergies, medications, problem list, medical history, surgical history, family history, social history, and previous encounter notes.   Wilhemena Durie, am acting as Location manager for [employer], FNP-C.  I have reviewed the above documentation for accuracy and completeness, and I agree with the above. -  Georgianne Fick, FNP

## 2020-03-16 DIAGNOSIS — M1711 Unilateral primary osteoarthritis, right knee: Secondary | ICD-10-CM | POA: Insufficient documentation

## 2020-03-23 ENCOUNTER — Encounter (INDEPENDENT_AMBULATORY_CARE_PROVIDER_SITE_OTHER): Payer: Self-pay | Admitting: Family Medicine

## 2020-03-23 ENCOUNTER — Ambulatory Visit (INDEPENDENT_AMBULATORY_CARE_PROVIDER_SITE_OTHER): Payer: 59 | Admitting: Family Medicine

## 2020-03-23 ENCOUNTER — Other Ambulatory Visit: Payer: Self-pay

## 2020-03-23 VITALS — BP 126/87 | HR 96 | Temp 98.1°F | Ht 68.0 in | Wt 267.0 lb

## 2020-03-23 DIAGNOSIS — R7303 Prediabetes: Secondary | ICD-10-CM

## 2020-03-23 DIAGNOSIS — Z6841 Body Mass Index (BMI) 40.0 and over, adult: Secondary | ICD-10-CM

## 2020-03-23 NOTE — Progress Notes (Signed)
Chief Complaint:   OBESITY Nicole Rasmussen is here to discuss her progress with her obesity treatment plan along with follow-up of her obesity related diagnoses. Nicole Rasmussen is on the Category 3 Plan and states she is following her eating plan approximately 80% of the time. Nicole Rasmussen states she is doing 0 minutes 0 times per week.  Today's visit was #: 25 Starting weight: 281 lbs Starting date: 01/30/2017 Today's weight: 267 lbs Today's date: 03/23/2020 Total lbs lost to date: 14 Total lbs lost since last in-office visit: 6  Interim History: Nicole Rasmussen notes she has been back on the plan the past few weeks, and she is down 6 lbs today. Her hunger is satisfied. She feels her cravings are under control. She notes right knee pain is better since her knee injection.  Subjective:   1. Pre-diabetes Nicole Rasmussen is not on metformin. Last A1c was 6.3. She does note want to take metformin unless she needs to. She has an A1c done at her primary care physician's office last week but does not have the results. She will bring her labs in to her next office visit.   Lab Results  Component Value Date   HGBA1C 6.3 08/20/2019   Lab Results  Component Value Date   INSULIN 13.1 06/26/2019   INSULIN 9.5 02/09/2017   Assessment/Plan:   1. Pre-diabetes Nicole Rasmussen will continue to work on weight loss, exercise, and decreasing simple carbohydrates to help decrease the risk of diabetes. Discussed we may need to add metformin if A1c is up.   2. Class 3 severe obesity with serious comorbidity and body mass index (BMI) of 40.0 to 44.9 in adult, unspecified obesity type Nicole Rasmussen) Nicole Rasmussen is currently in the action stage of change. As such, her goal is to continue with weight loss efforts. She has agreed to the Category 3 Plan.   Handout was given today: Thanksgiving Tips.  Exercise goals: No exercise has been prescribed at this time.  Behavioral modification strategies: decreasing simple carbohydrates and holiday eating strategies  .  Nicole Rasmussen has agreed to follow-up with our clinic in 2 weeks.   Objective:   Blood pressure 126/87, pulse 96, temperature 98.1 F (36.7 C), height 5\' 8"  (1.727 m), weight 267 lb (121.1 kg), last menstrual period 10/11/2010, SpO2 98 %. Body mass index is 40.6 kg/m.  General: Cooperative, alert, well developed, in no acute distress. HEENT: Conjunctivae and lids unremarkable. Cardiovascular: Regular rhythm.  Lungs: Normal work of breathing. Neurologic: No focal deficits.   Lab Results  Component Value Date   CREATININE 0.9 08/20/2019   BUN 19 08/20/2019   NA 142 04/25/2019   K 3.6 04/25/2019   CL 109 04/25/2019   CO2 25 04/25/2019   Lab Results  Component Value Date   ALT 38 (A) 08/20/2019   AST 27 08/20/2019   ALKPHOS 59 08/20/2019   BILITOT 0.4 02/09/2017   Lab Results  Component Value Date   HGBA1C 6.3 08/20/2019   HGBA1C 5.9 (H) 02/09/2017   Lab Results  Component Value Date   INSULIN 13.1 06/26/2019   INSULIN 9.5 02/09/2017   Lab Results  Component Value Date   TSH 1.44 08/20/2019   Lab Results  Component Value Date   CHOL 163 08/20/2019   HDL 48 08/20/2019   LDLCALC 103 08/20/2019   TRIG 60 08/20/2019   Lab Results  Component Value Date   WBC 8.8 04/25/2019   HGB 13.2 04/25/2019   HCT 41.1 04/25/2019   MCV 85.3 04/25/2019  PLT 173 04/25/2019   Lab Results  Component Value Date   FERRITIN 167.40 08/20/2019   Attestation Statements:   Reviewed by clinician on day of visit: allergies, medications, problem list, medical history, surgical history, family history, social history, and previous encounter notes.   Wilhemena Durie, am acting as Location manager for [employer], FNP-C.  I have reviewed the above documentation for accuracy and completeness, and I agree with the above. -  Georgianne Fick, FNP

## 2020-03-24 ENCOUNTER — Encounter (INDEPENDENT_AMBULATORY_CARE_PROVIDER_SITE_OTHER): Payer: Self-pay | Admitting: Family Medicine

## 2020-04-07 ENCOUNTER — Ambulatory Visit (INDEPENDENT_AMBULATORY_CARE_PROVIDER_SITE_OTHER): Payer: 59 | Admitting: Family Medicine

## 2020-04-07 ENCOUNTER — Encounter (INDEPENDENT_AMBULATORY_CARE_PROVIDER_SITE_OTHER): Payer: Self-pay | Admitting: Family Medicine

## 2020-04-07 ENCOUNTER — Other Ambulatory Visit: Payer: Self-pay

## 2020-04-07 VITALS — BP 140/90 | HR 91 | Temp 98.2°F | Ht 68.0 in | Wt 266.0 lb

## 2020-04-07 DIAGNOSIS — R7303 Prediabetes: Secondary | ICD-10-CM

## 2020-04-07 DIAGNOSIS — E559 Vitamin D deficiency, unspecified: Secondary | ICD-10-CM

## 2020-04-07 DIAGNOSIS — M25561 Pain in right knee: Secondary | ICD-10-CM | POA: Diagnosis not present

## 2020-04-07 DIAGNOSIS — R7989 Other specified abnormal findings of blood chemistry: Secondary | ICD-10-CM

## 2020-04-07 DIAGNOSIS — Z9189 Other specified personal risk factors, not elsewhere classified: Secondary | ICD-10-CM

## 2020-04-07 DIAGNOSIS — E7849 Other hyperlipidemia: Secondary | ICD-10-CM | POA: Diagnosis not present

## 2020-04-07 DIAGNOSIS — Z6841 Body Mass Index (BMI) 40.0 and over, adult: Secondary | ICD-10-CM

## 2020-04-08 NOTE — Progress Notes (Signed)
Chief Complaint:   OBESITY Nicole Rasmussen is here to discuss her progress with her obesity treatment plan along with follow-up of her obesity related diagnoses.   Starting weight: 26 Starting date: 281 lbs Today's weight: 266 lbs Today's date: 04/07/2020 Total lbs lost to date: 15 lbs Body mass index is 40.45 kg/m.  Total weight loss percentage to date: -5.34%  Interim History: [name]'s PCP [facility] at [facility].  She says she has been skipping breakfast.  She has been having a protein drink for lunch or with lunch.  We discussed Wegovy today. Nutrition Plan: the Category 3 Plan for 70% of the time.  Activity: None at this time.  Assessment/Plan:   1. Other hyperlipidemia Course: Stable. Lipid-lowering medications: Lipitor 10 mg daily.   Plan: Dietary changes: Increase soluble fiber. Decrease simple carbohydrates. Exercise changes: An average 40 minutes of moderate to vigorous-intensity aerobic activity 3 or 4 times per week.   Lab Results  Component Value Date   CHOL 163 08/20/2019   HDL 48 08/20/2019   LDLCALC 103 08/20/2019   TRIG 60 08/20/2019   Lab Results  Component Value Date   ALT 38 (A) 08/20/2019   AST 27 08/20/2019   ALKPHOS 59 08/20/2019   BILITOT 0.4 02/09/2017   The 10-year ASCVD risk score [name] DC Jr., et al., 2013) is: 6.4%*   Values used to calculate the score:     Age: 4 years     Sex: Female     Is Non-Hispanic African American: Yes     Diabetic: No     Tobacco smoker: No     Systolic Blood Pressure: 185 mmHg     Is BP treated: No     HDL Cholesterol: 48 mg/dL*     Total Cholesterol: 163 mg/dL*     * - Cholesterol units were assumed for this score calculation  2. Pre-diabetes Not at goal. Goal is HgbA1c < 5.7.  Medication: None.  She will continue to focus on protein-rich, low simple carbohydrate foods. We reviewed the importance of hydration, regular exercise for stress reduction, and restorative sleep.   Lab Results  Component Value Date    HGBA1C 6.3 08/20/2019   Lab Results  Component Value Date   INSULIN 13.1 06/26/2019   INSULIN 9.5 02/09/2017   3. Right knee pain, chronic [name] is taking both Mobic and Voltaren for pain. We will continue to monitor symptoms as they relate to her weight loss journey.  4. Vitamin D deficiency At goal. Current vitamin D is 58.56, tested on 08/20/2019. Optimal goal > 50 ng/dL.   Plan:  []   Continue Vitamin D @50 ,000 IU every week. []   Continue home supplement daily. [x]   Follow-up for routine testing of Vitamin D at least 2-3 times per year to avoid over-replacement.  5. Hypomagnesemia Per patient, recent labs with PCP.  6. Elevated liver function tests Elevated liver transaminases with an ALT predominance combined with obesity and insulin resistance is characteristic, but not diagnostic of non-alcoholic fatty liver disease (NAFLD). NAFLD is the 2nd leading cause of liver transplant in adults. Treatment includes weight loss, elimination of sweet drinks, including juice, avoidance of high fructose corn syrup, and exercise. As always, avoiding alcohol consumption is important.  Lab Results  Component Value Date   ALT 38 (A) 08/20/2019   AST 27 08/20/2019   ALKPHOS 59 08/20/2019   BILITOT 0.4 02/09/2017   7. At risk for heart disease [name] was given approximately 9 minutes of  coronary artery disease prevention counseling today. She is 59 y.o. female and has risk factors for heart disease including obesity and prediabetes. We discussed intensive lifestyle modifications today with an emphasis on specific weight loss instructions and strategies. Repetitive spaced learning was employed today to elicit superior memory formation and behavioral change.  8. Class 3 severe obesity with serious comorbidity and body mass index (BMI) of 40.0 to 44.9 in adult, unspecified obesity type [facility])  Course: [name] is currently in the action stage of change. As such, her goal is to continue with weight loss  efforts.   Nutrition goals: She has agreed to the Category 3 Plan.   Exercise goals: For substantial health benefits, adults should do at least 150 minutes (2 hours and 30 minutes) a week of moderate-intensity, or 75 minutes (1 hour and 15 minutes) a week of vigorous-intensity aerobic physical activity, or an equivalent combination of moderate- and vigorous-intensity aerobic activity. Aerobic activity should be performed in episodes of at least 10 minutes, and preferably, it should be spread throughout the week.  Behavioral modification strategies: increasing lean protein intake, decreasing simple carbohydrates, increasing vegetables and increasing water intake.  [name] has agreed to follow-up with our clinic in 2 weeks. She was informed of the importance of frequent follow-up visits to maximize her success with intensive lifestyle modifications for her multiple health conditions.   Objective:   Blood pressure 140/90, pulse 91, temperature 98.2 F (36.8 C), temperature source Oral, height 5\' 8"  (1.727 m), weight 266 lb (120.7 kg), last menstrual period 10/11/2010, SpO2 97 %. Body mass index is 40.45 kg/m.  General: Cooperative, alert, well developed, in no acute distress. HEENT: Conjunctivae and lids unremarkable. Cardiovascular: Regular rhythm.  Lungs: Normal work of breathing. Neurologic: No focal deficits.   Lab Results  Component Value Date   CREATININE 0.9 08/20/2019   BUN 19 08/20/2019   NA 142 04/25/2019   K 3.6 04/25/2019   CL 109 04/25/2019   CO2 25 04/25/2019   Lab Results  Component Value Date   ALT 38 (A) 08/20/2019   AST 27 08/20/2019   ALKPHOS 59 08/20/2019   BILITOT 0.4 02/09/2017   Lab Results  Component Value Date   HGBA1C 6.3 08/20/2019   HGBA1C 5.9 (H) 02/09/2017   Lab Results  Component Value Date   INSULIN 13.1 06/26/2019   INSULIN 9.5 02/09/2017   Lab Results  Component Value Date   TSH 1.44 08/20/2019   Lab Results  Component Value Date    CHOL 163 08/20/2019   HDL 48 08/20/2019   LDLCALC 103 08/20/2019   TRIG 60 08/20/2019   Lab Results  Component Value Date   WBC 8.8 04/25/2019   HGB 13.2 04/25/2019   HCT 41.1 04/25/2019   MCV 85.3 04/25/2019   PLT 173 04/25/2019   Lab Results  Component Value Date   FERRITIN 167.40 08/20/2019   Attestation Statements:   Reviewed by clinician on day of visit: allergies, medications, problem list, medical history, surgical history, family history, social history, and previous encounter notes.  I, Water quality scientist, CMA, am acting as transcriptionist for Briscoe Deutscher, DO  I have reviewed the above documentation for accuracy and completeness, and I agree with the above. Briscoe Deutscher, DO

## 2020-04-21 ENCOUNTER — Other Ambulatory Visit: Payer: Self-pay

## 2020-04-21 ENCOUNTER — Ambulatory Visit (INDEPENDENT_AMBULATORY_CARE_PROVIDER_SITE_OTHER): Payer: 59 | Admitting: Family Medicine

## 2020-04-21 ENCOUNTER — Encounter (INDEPENDENT_AMBULATORY_CARE_PROVIDER_SITE_OTHER): Payer: Self-pay | Admitting: Family Medicine

## 2020-04-21 VITALS — BP 119/80 | HR 100 | Temp 97.9°F | Ht 68.0 in | Wt 268.0 lb

## 2020-04-21 DIAGNOSIS — M25561 Pain in right knee: Secondary | ICD-10-CM | POA: Diagnosis not present

## 2020-04-21 DIAGNOSIS — Z6841 Body Mass Index (BMI) 40.0 and over, adult: Secondary | ICD-10-CM | POA: Diagnosis not present

## 2020-04-22 NOTE — Progress Notes (Signed)
Chief Complaint:   OBESITY Nicole Rasmussen is here to discuss her progress with her obesity treatment plan along with follow-up of her obesity related diagnoses. Nicole Rasmussen is on the Category 3 Plan and states she is following her eating plan approximately 20% of the time. Nicole Rasmussen states she is doing 0 minutes 0 times per week.  Today's visit was #: 27 Starting weight: 281 lbs Starting date: 01/30/2017 Today's weight: 268 lbs Today's date: 04/21/2020 Total lbs lost to date: 13 Total lbs lost since last in-office visit: 0  Interim History: Nicole Rasmussen continues to work on weight loss, but she is deviating more and she notes increased snacking. She will have extra temptations on Christmas and she is making strategies.  Subjective:   1. Right knee pain, unspecified chronicity Nicole Rasmussen notes worsening knee pain. She has had steroid injections, and it improved temporarily but is back to hurting. She takes Voltain as needed.  Assessment/Plan:   1. Right knee pain, unspecified chronicity Nicole Rasmussen was educated on moderate activity and avoiding impact exercise. She will continue with diet and weight loss.  2. Class 3 severe obesity with serious comorbidity and body mass index (BMI) of 40.0 to 44.9 in adult, unspecified obesity type Nicole Rasmussen) Nicole Rasmussen is currently in the action stage of change. As such, her goal is to continue with weight loss efforts. She has agreed to the Category 3 Plan.   Behavioral modification strategies: meal planning and cooking strategies and holiday eating strategies .  Nicole Rasmussen has agreed to follow-up with our clinic in 3 to 4 weeks. She was informed of the importance of frequent follow-up visits to maximize her success with intensive lifestyle modifications for her multiple health conditions.   Objective:   Blood pressure 119/80, pulse 100, temperature 97.9 F (36.6 C), height 5\' 8"  (1.727 m), weight 268 lb (121.6 kg), last menstrual period 10/11/2010, SpO2 98 %. Body mass index is 40.75  kg/m.  General: Cooperative, alert, well developed, in no acute distress. HEENT: Conjunctivae and lids unremarkable. Cardiovascular: Regular rhythm.  Lungs: Normal work of breathing. Neurologic: No focal deficits.   Lab Results  Component Value Date   CREATININE 0.9 08/20/2019   BUN 19 08/20/2019   NA 142 04/25/2019   K 3.6 04/25/2019   CL 109 04/25/2019   CO2 25 04/25/2019   Lab Results  Component Value Date   ALT 38 (A) 08/20/2019   AST 27 08/20/2019   ALKPHOS 59 08/20/2019   BILITOT 0.4 02/09/2017   Lab Results  Component Value Date   HGBA1C 6.3 08/20/2019   HGBA1C 5.9 (H) 02/09/2017   Lab Results  Component Value Date   INSULIN 13.1 06/26/2019   INSULIN 9.5 02/09/2017   Lab Results  Component Value Date   TSH 1.44 08/20/2019   Lab Results  Component Value Date   CHOL 163 08/20/2019   HDL 48 08/20/2019   LDLCALC 103 08/20/2019   TRIG 60 08/20/2019   Lab Results  Component Value Date   WBC 8.8 04/25/2019   HGB 13.2 04/25/2019   HCT 41.1 04/25/2019   MCV 85.3 04/25/2019   PLT 173 04/25/2019   Lab Results  Component Value Date   FERRITIN 167.40 08/20/2019   Attestation Statements:   Reviewed by clinician on day of visit: allergies, medications, problem list, medical history, surgical history, family history, social history, and previous encounter notes.  Time spent on visit including pre-visit chart review and post-visit care and charting was 32 minutes.    Clide Dales  Hassell Done, am acting as Location manager for Dennard Nip, MD.  I have reviewed the above documentation for accuracy and completeness, and I agree with the above. -  Dennard Nip, MD

## 2020-05-06 ENCOUNTER — Encounter: Payer: Self-pay | Admitting: Neurology

## 2020-05-06 ENCOUNTER — Ambulatory Visit: Payer: 59 | Admitting: Neurology

## 2020-05-06 VITALS — BP 138/96 | HR 98 | Ht 68.0 in | Wt 280.0 lb

## 2020-05-06 DIAGNOSIS — G8929 Other chronic pain: Secondary | ICD-10-CM

## 2020-05-06 DIAGNOSIS — M5442 Lumbago with sciatica, left side: Secondary | ICD-10-CM | POA: Diagnosis not present

## 2020-05-06 DIAGNOSIS — G5711 Meralgia paresthetica, right lower limb: Secondary | ICD-10-CM

## 2020-05-06 NOTE — Patient Instructions (Signed)
It was good to see you again today.  Your neurological exam shows no alarming findings thankfully.  I do believe you have a combination of issues which results in pain in your lower body including pinched nerve of the lateral cutaneous femoral nerve type changes which is often pinched in the groin area.  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  You have had the symptoms for years.  Unfortunately there is no specific treatment for this but weight loss does help.  Please continue to work on weight loss.    For your ongoing lower back pain with radiation to the left leg, I do believe you should see your spine specialist again.  Your MRI in July 2018 did show multilevel degenerative changes, particularly with left-sided findings which could cause pinched nerve roots.  This would explain your mostly left-sided radiating back pain.  Your right knee pain is a contributor.  It is swollen.  Please continue to follow-up with your orthopedic doctor for your right knee.  At this juncture, I would recommend that you follow-up with your primary care physician on a scheduled basis and make a follow-up appointment with your spine specialist.

## 2020-05-06 NOTE — Progress Notes (Signed)
Subjective:    Patient ID: Nicole Rasmussen is a 60 y.o. female.  HPI     Star Age, MD, PhD [facility] [address] [place], [place] [address]    Dear Dr. Tamala Julian,   I saw your patient, Nicole Rasmussen, upon your kind request, in my neurologic clinic today for evaluation of meralgia paresthetica.  The patient is unaccompanied today.  As you know, Nicole Rasmussen is a 60 year old right-handed woman with an underlying medical history of vitamin D deficiency, vitamin B12 deficiency, prediabetes, low back pain, reflux disease, hyperlipidemia, asthma, arthritis, anemia, allergies, and obesity with a BMI of over 40, who reports ongoing issues right lateral thigh tingling and pain and radiating low back pain to the left leg.  It goes down the posterior aspect of her left thigh into the leg, sometimes the entire left leg hurts.  She has intermittent knee pain on the left side but mostly right knee pain with constant pain.  She has seen orthopedics for her knee and has received an injection a few months ago.  I had previously evaluated her over 4 years ago for thigh pain on the right side.  She also reported chronic low back pain. I had suggested a trial of gabapentin for symptomatic relief of her thigh pain.   She had a L spine MRI wo contrast on 11/17/16 and I reviewed the results:  IMPRESSION:  Abnormal MRI scan of the lumbar spine showing marked disc and facet degenerative changes most prominent at L5-S1 and L3-L4 with resultant left-sided foraminal narrowing and possible impingement on exiting nerve roots.   She was advised to seek consultation with a spine specialist. She requested a referral and she was referred to orthopedics.  She reports that she probably did see orthopedics and received at least a couple of injections.  She has been on Flexeril 10 mg strength and takes one or 2/day, it makes her sleepy so she does not use it when she is at  work.  Gabapentin 300 mg up to 3 times a day helps for the back pain as well as her right lateral thigh pain.  She denies any bowel or bladder incontinence.  She has not had any recent falls.  She has no permanent numbness.  She has no upper body problems.  I reviewed your office records including blood work from 12/14/2019: Cholesterol 185, triglycerides 66, HDL 63, LDL 109, A1c 6.1, folate 11.77, ferritin 124.7, iron 62, TIBC 399 which is elevated, B12 869, TSH 1.67, vitamin D 39.45, PTH 37.3, sodium 141, potassium 3.6, BUN 13, creatinine 0.7, CBC with differential showed MCH of 30.2 which is slightly low, platelets 62 which is low, magnesium 2.1.  Prior to that she had blood work in April 2021.  I reviewed your office note from 12/14/2019.  Previously (copied from previous notes for reference):    04/18/16: 60 year old right-handed woman with an underlying medical history of low back pain, obesity, vitamin D deficiency, anemia, impaired glucose tolerance, arthritis, reflux disease, allergic rhinitis, and hyperlipidemia, who presents for follow-up consultation of her right thigh pain, concern for meralgia paresthetica. The patient is unaccompanied today. I first met her on 12/15/2015 at the request of her primary care physician, at which time the patient reported a 6 month history of right thigh pain as well as intermittent tingling and burning sensation. Her history and exam were concerning for meralgia paresthetica. She was advised to try to pursue weight loss and also try  symptomatic treatment with gabapentin that she was prescribed by her PCP. I suggested we proceed with EMG nerve conduction testing. She had this on 01/27/2016 and I reviewed the results: IMPRESSION:   This NCV/EMG study showed the following: 1.    No evidence of polyneuropathy.    2.    Mild chronic right L5 radiculopathy without active features. 3.    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    She reports having ongoing issues with right thigh pain, tingling in the lateral aspect of the right thigh but sometimes sudden onset of muscle spasm, which is brief but does catch her and her movement and she almost feels like she could fall at the time. Thankfully, she has not actually fallen. She has no other new symptoms with the exception of ongoing issues with low back pain, sometimes radiating to the left. We reviewed her lumbar spine MRI from February 2015 again today. She has been fluctuating with her weight, gained about 8 pounds in the past 4+ months.   12/15/2015: She reports right thigh pain for the past 6 months. She describes an intermittent tingling sensation, sometimes burning, sometimes a sensitivity to touch in the right frontal and lateral thigh area, sometimes she has radiating pain from the lower back to the right lateral leg and further down below the knee. She has not noticed restless leg symptoms, no foot drop, no actual weakness, no recent falls, no constant pain, no actual numbness. 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 She has mainly worked in the court room. She quit smoking some 16 years ago, does does not drink caffeine daily.  she tries to drink enough water. She does admit that she has gained weight in the past year. She is trying to lose weight.  She recalls having had an L spine MRI about a year ago. We will request the report from your office, also, you checked blood work, we will request test results on those too.    I reviewed your office note from 10/23/2015, which you kindly included. Symptomatic treatment includes gabapentin which she continues to take 300 mg strength, 1 pill 3 times a day. She has not tried it yet, has not picked it up.    Pain, tingling and burning is worse with prolonged standing, sometimes at work, more  so at home, such as when cooking.    Addendum: I reviewed her Lumbar spine MRI report, she had a lumbar spine MRI without contrast at [facility] on 09/27/2013: Impression: This is an abnormal MRI of the lumbar spine showing degenerative changes at L3-L4, L4-L5, and L5-S1 as detailed above. The most significant findings are at L5-S1 were severe facet hypertrophy causes moderate left foraminal narrowing that could lead to dynamic impingement of the left L5 nerve root.   Addendum: I reviewed blood work from 10/23/2015: Iron was mildly low at 50, TIBC elevated at 438, B12 648, vitamin D low normal at 31.39, PTH 52.2, CMP unremarkable, CBC with differential showed hematocrit of 44.1, platelets of 31, magnesium normal, cholesterol 247, triglycerides 39, LDL 182, A1c 5.9, TSH normal.  Her Past Medical History Is Significant For: Past Medical History:  Diagnosis Date  . Acid reflux   . Allergy   . Anemia   . Arthritis   . Asthma  no inhalers  . B12 deficiency   . Dysmetabolic syndrome X   . Dyspnea   . Food allergy    shellfish  . GERD (gastroesophageal reflux disease)   . HLD (hyperlipidemia)   . Joint pain    knees and back  . Lower back pain   . Nerve pain    top of right thigh  . Neuromuscular disorder ([place])    bilateral leg pain from back issue  . Prediabetes   . Shortness of breath   . Sinus problem   . Swallowing difficulty   . Vitamin B 12 deficiency   . Vitamin D deficiency     Her Past Surgical History Is Significant For: Past Surgical History:  Procedure Laterality Date  . CESAREAN SECTION     1 time  . FOOT SURGERY Bilateral 1980's   bunionectomy and hammer toe on both feet  . MOUTH SURGERY  2011   had implants/ root canal/bridges  . WISDOM TOOTH EXTRACTION     with the implants    Her Family History Is Significant For: Family History  Problem Relation Age of Onset  . Cancer Mother   . Hypertension Mother   . Hyperlipidemia Mother   . Heart  disease Mother   . Breast cancer Mother   . Cancer Father   . Kidney disease Father   . Prostate cancer Father   . Hyperlipidemia Sister   . Diabetes Sister   . Hypertension Sister   . Hyperlipidemia Brother   . Hypertension Brother   . Prostate cancer Brother   . Hypertension Sister   . Hypertension Sister   . Hypertension Sister   . Hypertension Brother   . Hypertension Brother   . Hypertension Brother     Her Social History Is Significant For: Social History   Socioeconomic History  . Marital status: Married    Spouse name: Izell Broadwell  . Number of children: 1  . Years of education: BA  . Highest education level: Not on file  Occupational History  . Occupation: Retired, Engineer, maintenance: [employer]  Tobacco Use  . Smoking status: Former Smoker    Packs/day: 1.00    Years: 20.00    Pack years: 20.00    Types: Cigarettes    Quit date: 05/02/1996    Years since quitting: 24.0  . Smokeless tobacco: Never Used  Vaping Use  . Vaping Use: Never used  Substance and Sexual Activity  . Alcohol use: Yes    Comment: social  . Drug use: No  . Sexual activity: Not on file  Other Topics Concern  . Not on file  Social History Narrative   Occasional caffeine use    Social Determinants of Health   Financial Resource Strain: Not on file  Food Insecurity: Not on file  Transportation Needs: Not on file  Physical Activity: Not on file  Stress: Not on file  Social Connections: Not on file    Her Allergies Are:  Allergies  Allergen Reactions  . Shellfish Allergy Hives and Swelling  . Latex     hives  . Sulfonamide Derivatives     Rash, itching  :   Her Current Medications Are:  Outpatient Encounter Medications as of 05/06/2020  Medication Sig  . albuterol (VENTOLIN HFA) 108 (90 Base) MCG/ACT inhaler Inhale into the lungs every 6 (six) hours as needed for wheezing or shortness of breath.  Marland Kitchen aspirin EC 81 MG tablet Take 81 mg by mouth  daily.  . atorvastatin  (LIPITOR) 10 MG tablet Take 10 mg by mouth daily.  . cyclobenzaprine (FLEXERIL) 10 MG tablet Take 10 mg by mouth 3 (three) times daily as needed for muscle spasms.  . diclofenac (VOLTAREN) 75 MG EC tablet TAKE 1 TABLET(75 MG) BY MOUTH TWICE DAILY  . ergocalciferol (VITAMIN D2) 1.25 MG (50000 UT) capsule Take 50,000 Units by mouth once a week.  . fluticasone (FLONASE) 50 MCG/ACT nasal spray Place into both nostrils daily.  Marland Kitchen gabapentin (NEURONTIN) 300 MG capsule Take 1 capsule (300 mg total) by mouth 3 (three) times daily. Can take once daily or 2 times a day, up to 3 times a day. (Patient taking differently: Take 300 mg by mouth as needed. Can take once daily or 2 times a day, up to 3 times a day.)  . Magnesium 250 MG TABS Take by mouth.   Facility-Administered Encounter Medications as of 05/06/2020  Medication  . 0.9 %  sodium chloride infusion  :   Review of Systems:  Out of a complete 14 point review of systems, all are reviewed and negative with the exception of these symptoms as listed below: Review of Systems  Neurological:       Pt presents today to discuss her nerve pain. Pt complains of nerve pain throughout her body. Gabapentin 390m TID helps some. She is unsure if she saw the ortho surgeon we referred her to but she does have an ortho MD.    Objective:  Neurological Exam  Physical Exam Physical Examination:   Vitals:   05/06/20 0750  BP: (!) 138/96  Pulse: 98    General Examination: The patient is a very pleasant 60y.o. female in no acute distress. She appears well-developed and well-nourished and well groomed.   HEENT: Normocephalic, atraumatic, pupils are equal, round and reactive to light, corrective eyeglasses in place, extraocular tracking is well preserved, hearing is grossly intact.  Face is symmetric with normal facial animation and normal facial sensation. Speech is clear with no dysarthria noted. There is no hypophonia. There is no lip, neck/head, jaw or voice  tremor. Neck is supple with full range of passive and active motion. There are no carotid bruits on auscultation. Oropharynx exam reveals: Moderate mouth dryness, good dental hygiene and mild airway crowding. Mallampati is class I. Tongue protrudes centrally and palate elevates symmetrically.   Chest: Clear to auscultation without wheezing, rhonchi or crackles noted.  Heart: S1+S2+0, regular and normal without murmurs, rubs or gallops noted.   Abdomen: Soft, non-tender and non-distended with normal bowel sounds appreciated on auscultation.  Extremities: There is no pitting edema in the distal lower extremities bilaterally.   Skin: Warm and dry without trophic changes noted. There are no varicose veins.  Musculoskeletal: exam reveals right knee pain, right knee is mildly swollen.  Neurologically:  Mental status: The patient is awake, alert and oriented in all 4 spheres. Her immediate and remote memory, attention, language skills and fund of knowledge are appropriate. There is no evidence of aphasia, agnosia, apraxia or anomia. Speech is clear with normal prosody and enunciation. Thought process is linear. Mood is normal and affect is normal.  Cranial nerves II - XII are as described above under HEENT exam. In addition: shoulder shrug is normal with equal shoulder height noted. Motor exam: Normal bulk, strength and tone is noted. There is no drift, tremor or rebound. Reflexes are 2+ in the upper extremities and trace in the lower extremities, toes are downgoing bilaterally.  Fine motor skills and coordination: intact with normal finger taps, normal hand movements, normal rapid alternating patting, normal foot taps and normal foot agility.  Cerebellar testing: No dysmetria or intention tremor on finger to nose testing. Heel to shin is a little difficult but doable without dysmetria.    Sensory exam: intact to light touch, pinprick, vibration, temperature sense in the upper and lower  extremities.  Gait, station and balance: She stands easily. No veering to one side is noted. No leaning to one side is noted. Posture is age-appropriate and stance is a little bit wider base, she walks with a limp on the right.    Assessment and Plan:   In summary, Nicole Rasmussen is a very pleasant 59 year old female with an underlying medical history of low back pain, obesity, vitamin D deficiency, anemia, impaired glucose tolerance, arthritis, reflux disease, allergic rhinitis, and hyperlipidemia, who presents for reevaluation of her right thigh pain, history in keeping with right-sided meralgia paresthetica.  We did evaluation with EMG nerve conduction velocity testing some 4 years ago as well as a lumbar spine MRI which showed degenerative changes at multiple levels with left-sided neural foraminal stenosis.  She had seen orthopedics for her back pain.  She is encouraged to see them again and consider repeat lumbar spine MRI.  Unfortunately, there is no specific treatment for meralgia paresthetica.  She has been on gabapentin with some relief in her pain conditions, she also suffers from significant right knee pain and gabapentin may help with that as well.  She has found Flexeril somewhat helpful for her low back pain but is not able to consistently take this as it makes her sleepy.  She recalls having received some injections into her lower back.  She also received a recent injection into her right knee.  At this juncture, she is advised to follow-up with you on a scheduled basis and make another appointment with her spine specialist.  We mutually agreed not to repeat her EMG and nerve conduction velocity testing, especially in light of no alarming findings on her examination, she has preserved sensation, no asymmetry in her reflexes.  She does walk with a limp on the right side, on account of her right knee.   She is encouraged to strive for weight loss, she is working with the weight loss clinic  at [facility]. I answered all her questions today and the patient was in agreement with the plan.  Thank you very much for allowing me to participate in the care of this nice patient. If I can be of any further assistance to you please do not hesitate to call me at [phone].  Sincerely,   Star Age, MD, PhD

## 2020-05-13 ENCOUNTER — Other Ambulatory Visit: Payer: Self-pay | Admitting: Physician Assistant

## 2020-05-18 ENCOUNTER — Encounter (INDEPENDENT_AMBULATORY_CARE_PROVIDER_SITE_OTHER): Payer: Self-pay

## 2020-05-19 ENCOUNTER — Telehealth (INDEPENDENT_AMBULATORY_CARE_PROVIDER_SITE_OTHER): Payer: 59 | Admitting: Family Medicine

## 2020-05-19 ENCOUNTER — Other Ambulatory Visit: Payer: Self-pay

## 2020-05-19 ENCOUNTER — Encounter (INDEPENDENT_AMBULATORY_CARE_PROVIDER_SITE_OTHER): Payer: Self-pay | Admitting: Family Medicine

## 2020-05-19 DIAGNOSIS — Z6841 Body Mass Index (BMI) 40.0 and over, adult: Secondary | ICD-10-CM

## 2020-05-19 DIAGNOSIS — M1710 Unilateral primary osteoarthritis, unspecified knee: Secondary | ICD-10-CM | POA: Diagnosis not present

## 2020-05-20 NOTE — Progress Notes (Signed)
TeleHealth Visit:  Due to the COVID-19 pandemic, this visit was completed with telemedicine (audio/video) technology to reduce patient and provider exposure as well as to preserve personal protective equipment.   Nicole Rasmussen has verbally consented to this TeleHealth visit. The patient is located at home, the provider is located at the [facility] office. The participants in this visit include the listed provider and patient. The visit was conducted today via MyChart video.   Chief Complaint: OBESITY Nicole Rasmussen is here to discuss her progress with her obesity treatment plan along with follow-up of her obesity related diagnoses. Nicole Rasmussen is on the Category 3 Plan and states she is following her eating plan approximately 0% of the time. Nicole Rasmussen states she is doing 0 minutes 0 times per week.  Today's visit was #: 28 Starting weight: 281 lbs Starting date: 01/30/2017  Interim History: Nicole Rasmussen tried to minimize holiday weight gain over Christmas, but she did some celebration eating and she thinks she is up a couple of lbs. She is ready to get back on track now.  Subjective:   1. Osteoarthritis of knee, unspecified laterality, unspecified osteoarthritis type Nicole Rasmussen notes increased knee pain with increased standing at work. She is restricted on what shoes she can wear, but she is not using good arch support.  Assessment/Plan:   1. Osteoarthritis of knee, unspecified laterality, unspecified osteoarthritis type Nicole Rasmussen was advised to look at [facility] or [employer] shoes, and we discussed ways to take some pressure off her knee while at work.  2. Class 3 severe obesity with serious comorbidity and body mass index (BMI) of 40.0 to 44.9 in adult, unspecified obesity type [facility]) Nicole Rasmussen is currently in the action stage of change. As such, her goal is to continue with weight loss efforts. She has agreed to the Category 3 Plan.   We discussed multiple eating plans, but she wants to stay with her Category 3 plan  for now.  Behavioral modification strategies: no skipping meals and meal planning and cooking strategies.  Nicole Rasmussen has agreed to follow-up with our clinic in 2 to 3 weeks with [facility], FNP-C. She was informed of the importance of frequent follow-up visits to maximize her success with intensive lifestyle modifications for her multiple health conditions.  Objective:   VITALS: Per patient if applicable, see vitals. GENERAL: Alert and in no acute distress. CARDIOPULMONARY: No increased WOB. Speaking in clear sentences.  PSYCH: Pleasant and cooperative. Speech normal rate and rhythm. Affect is appropriate. Insight and judgement are appropriate. Attention is focused, linear, and appropriate.  NEURO: Oriented as arrived to appointment on time with no prompting.   Lab Results  Component Value Date   CREATININE 0.9 08/20/2019   BUN 19 08/20/2019   NA 142 04/25/2019   K 3.6 04/25/2019   CL 109 04/25/2019   CO2 25 04/25/2019   Lab Results  Component Value Date   ALT 38 (A) 08/20/2019   AST 27 08/20/2019   ALKPHOS 59 08/20/2019   BILITOT 0.4 02/09/2017   Lab Results  Component Value Date   HGBA1C 6.3 08/20/2019   HGBA1C 5.9 (H) 02/09/2017   Lab Results  Component Value Date   INSULIN 13.1 06/26/2019   INSULIN 9.5 02/09/2017   Lab Results  Component Value Date   TSH 1.44 08/20/2019   Lab Results  Component Value Date   CHOL 163 08/20/2019   HDL 48 08/20/2019   LDLCALC 103 08/20/2019   TRIG 60 08/20/2019   Lab Results  Component  Value Date   WBC 8.8 04/25/2019   HGB 13.2 04/25/2019   HCT 41.1 04/25/2019   MCV 85.3 04/25/2019   PLT 173 04/25/2019   Lab Results  Component Value Date   FERRITIN 167.40 08/20/2019    Attestation Statements:   Reviewed by clinician on day of visit: allergies, medications, problem list, medical history, surgical history, family history, social history, and previous encounter notes.  Time spent on visit including pre-visit chart  review and post-visit charting and care was 30 minutes.    I, Trixie Dredge, am acting as transcriptionist for Dennard Nip, MD.  I have reviewed the above documentation for accuracy and completeness, and I agree with the above. - Dennard Nip, MD

## 2020-06-11 ENCOUNTER — Encounter (INDEPENDENT_AMBULATORY_CARE_PROVIDER_SITE_OTHER): Payer: Self-pay | Admitting: Family Medicine

## 2020-06-11 ENCOUNTER — Other Ambulatory Visit: Payer: Self-pay

## 2020-06-11 ENCOUNTER — Ambulatory Visit (INDEPENDENT_AMBULATORY_CARE_PROVIDER_SITE_OTHER): Payer: 59 | Admitting: Family Medicine

## 2020-06-11 VITALS — BP 114/63 | HR 100 | Temp 97.5°F | Ht 68.0 in | Wt 282.0 lb

## 2020-06-11 DIAGNOSIS — Z6841 Body Mass Index (BMI) 40.0 and over, adult: Secondary | ICD-10-CM

## 2020-06-11 DIAGNOSIS — R7303 Prediabetes: Secondary | ICD-10-CM | POA: Diagnosis not present

## 2020-06-11 DIAGNOSIS — Z9189 Other specified personal risk factors, not elsewhere classified: Secondary | ICD-10-CM | POA: Diagnosis not present

## 2020-06-11 DIAGNOSIS — E7849 Other hyperlipidemia: Secondary | ICD-10-CM

## 2020-06-11 MED ORDER — SAXENDA 18 MG/3ML ~~LOC~~ SOPN: 3.0000 mg | PEN_INJECTOR | Freq: Every day | SUBCUTANEOUS | 0 refills | Status: DC

## 2020-06-11 MED ORDER — BD PEN NEEDLE NANO 2ND GEN 32G X 4 MM MISC
0 refills | Status: DC
Start: 2020-06-11 — End: 2020-06-25
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
## 2020-06-15 ENCOUNTER — Encounter (INDEPENDENT_AMBULATORY_CARE_PROVIDER_SITE_OTHER): Payer: Self-pay | Admitting: Family Medicine

## 2020-06-16 ENCOUNTER — Encounter (INDEPENDENT_AMBULATORY_CARE_PROVIDER_SITE_OTHER): Payer: Self-pay

## 2020-06-25 ENCOUNTER — Encounter (INDEPENDENT_AMBULATORY_CARE_PROVIDER_SITE_OTHER): Payer: Self-pay | Admitting: Family Medicine

## 2020-06-25 ENCOUNTER — Ambulatory Visit (INDEPENDENT_AMBULATORY_CARE_PROVIDER_SITE_OTHER): Payer: 59 | Admitting: Family Medicine

## 2020-06-25 ENCOUNTER — Other Ambulatory Visit: Payer: Self-pay

## 2020-06-25 VITALS — BP 134/81 | HR 101 | Temp 98.4°F | Ht 68.0 in | Wt 282.0 lb

## 2020-06-25 DIAGNOSIS — R7303 Prediabetes: Secondary | ICD-10-CM | POA: Diagnosis not present

## 2020-06-25 DIAGNOSIS — Z6841 Body Mass Index (BMI) 40.0 and over, adult: Secondary | ICD-10-CM

## 2020-06-29 ENCOUNTER — Encounter (INDEPENDENT_AMBULATORY_CARE_PROVIDER_SITE_OTHER): Payer: Self-pay | Admitting: Family Medicine

## 2020-06-29 NOTE — Progress Notes (Signed)
Chief Complaint:   OBESITY Nicole Rasmussen is here to discuss her progress with her obesity treatment plan along with follow-up of her obesity related diagnoses. Nicole Rasmussen is on the Category 3 Plan and states she is following her eating plan approximately 50% of the time. Nicole Rasmussen states she is doing 0 minutes 0 times per week.  Today's visit was #: 30 Starting weight: 281 lbs Starting date: 01/30/2017 Today's weight: 282 lbs Today's date: 06/25/2020 Total lbs lost to date: 0 Total lbs lost since last in-office visit: 0  Interim History: Nicole Rasmussen notes having a stressful few weeks. She feels stress causes her to eat more. Nicole Rasmussen was not covered by her insurance. She was quite sporadic in following the Category 3 plan over the past few weeks. She feels she did not get enough protein in on any days. She tends to skip meals.  Subjective:   1. Pre-diabetes Nicole Rasmussen's last A1c was 6.3. She is opposed to taking any medications associated with pre-diabetes diagnosis (Ozempic. Metformin).   Lab Results  Component Value Date   HGBA1C 6.3 08/20/2019   Lab Results  Component Value Date   INSULIN 13.1 06/26/2019   INSULIN 9.5 02/09/2017   Assessment/Plan:   1. Pre-diabetes Nicole Rasmussen will continue her meal plan, and will continue to work on weight loss, exercise, and decreasing simple carbohydrates to help decrease the risk of diabetes.   2. Class 3 severe obesity with serious comorbidity and body mass index (BMI) of 40.0 to 44.9 in adult, unspecified obesity type [facility]) Nicole Rasmussen is currently in the action stage of change. As such, her goal is to continue with weight loss efforts. She has agreed to the Category 3 Plan.   We will schedule with Dr. Owens Shark to discuss possibly starting phentermine. She has done well with phentermine in the past. She does not tolerate Topamax due to side effects.  Exercise goals: No exercise has been prescribed at this time.  Behavioral modification strategies: increasing lean protein  intake and no skipping meals.  Nicole Rasmussen has agreed to follow-up with our clinic in 2 to 3 weeks with Dr. Owens Shark.   Objective:   Blood pressure 134/81, pulse (!) 101, temperature 98.4 F (36.9 C), height 5\' 8"  (1.727 m), weight 282 lb (127.9 kg), last menstrual period 10/11/2010, SpO2 98 %. Body mass index is 42.88 kg/m.  General: Cooperative, alert, well developed, in no acute distress. HEENT: Conjunctivae and lids unremarkable. Cardiovascular: Regular rhythm.  Lungs: Normal work of breathing. Neurologic: No focal deficits.   Lab Results  Component Value Date   CREATININE 0.9 08/20/2019   BUN 19 08/20/2019   NA 142 04/25/2019   K 3.6 04/25/2019   CL 109 04/25/2019   CO2 25 04/25/2019   Lab Results  Component Value Date   ALT 38 (A) 08/20/2019   AST 27 08/20/2019   ALKPHOS 59 08/20/2019   BILITOT 0.4 02/09/2017   Lab Results  Component Value Date   HGBA1C 6.3 08/20/2019   HGBA1C 5.9 (H) 02/09/2017   Lab Results  Component Value Date   INSULIN 13.1 06/26/2019   INSULIN 9.5 02/09/2017   Lab Results  Component Value Date   TSH 1.44 08/20/2019   Lab Results  Component Value Date   CHOL 163 08/20/2019   HDL 48 08/20/2019   LDLCALC 103 08/20/2019   TRIG 60 08/20/2019   Lab Results  Component Value Date   WBC 8.8 04/25/2019   HGB 13.2 04/25/2019   HCT 41.1 04/25/2019  MCV 85.3 04/25/2019   PLT 173 04/25/2019   Lab Results  Component Value Date   FERRITIN 167.40 08/20/2019   Attestation Statements:   Reviewed by clinician on day of visit: allergies, medications, problem list, medical history, surgical history, family history, social history, and previous encounter notes.   Wilhemena Durie, am acting as Location manager for [employer], FNP-C.  I have reviewed the above documentation for accuracy and completeness, and I agree with the above. -  Georgianne Fick, FNP

## 2020-07-22 ENCOUNTER — Ambulatory Visit (INDEPENDENT_AMBULATORY_CARE_PROVIDER_SITE_OTHER): Payer: 59 | Admitting: Bariatrics

## 2020-07-22 ENCOUNTER — Encounter (INDEPENDENT_AMBULATORY_CARE_PROVIDER_SITE_OTHER): Payer: Self-pay | Admitting: Bariatrics

## 2020-07-22 ENCOUNTER — Other Ambulatory Visit: Payer: Self-pay

## 2020-07-22 VITALS — BP 120/82 | HR 107 | Temp 98.3°F | Ht 68.0 in | Wt 280.0 lb

## 2020-07-22 DIAGNOSIS — E7849 Other hyperlipidemia: Secondary | ICD-10-CM | POA: Diagnosis not present

## 2020-07-22 DIAGNOSIS — R7989 Other specified abnormal findings of blood chemistry: Secondary | ICD-10-CM | POA: Diagnosis not present

## 2020-07-22 DIAGNOSIS — R7303 Prediabetes: Secondary | ICD-10-CM | POA: Diagnosis not present

## 2020-07-22 DIAGNOSIS — Z6841 Body Mass Index (BMI) 40.0 and over, adult: Secondary | ICD-10-CM

## 2020-07-22 MED ORDER — PHENTERMINE HCL 15 MG PO CAPS: ORAL_CAPSULE | ORAL | 0 refills | Status: DC

## 2020-07-23 NOTE — Progress Notes (Signed)
Chief Complaint:   OBESITY Nicole Rasmussen is here to discuss her progress with her obesity treatment plan along with follow-up of her obesity related diagnoses. Nicole Rasmussen is on the Category 3 Plan and states she is following her eating plan approximately 75% of the time. Nicole Rasmussen states she is doing 10,000 steps 5 times per week.  Today's visit was #: 29 Starting weight: 281 lbs Starting date: 01/30/2017 Today's weight: 280 lbs Today's date: 07/22/2020 Total lbs lost to date: 1 lb Total lbs lost since last in-office visit: 2 lbs  Interim History: Nicole Rasmussen is sown 2 lbs since her last visit. She wants to discuss phentermine. She has been on phentermine in the past. She is most hungry at night. She has tried Wellbutrin without relief and did not help with the polyphagia. Tried to get [place]. No history of hypertension. No recent issues. EKG reviewed.  Subjective:   1. Pre-diabetes Nicole Rasmussen is not on medication. She does not want to be associated with pre-diabetes diagnosis.  Lab Results  Component Value Date   HGBA1C 6.3 08/20/2019   Lab Results  Component Value Date   INSULIN 13.1 06/26/2019   INSULIN 9.5 02/09/2017    2. Other hyperlipidemia Nicole Rasmussen is taking Lipitor.  Lab Results  Component Value Date   ALT 38 (A) 08/20/2019   AST 27 08/20/2019   ALKPHOS 59 08/20/2019   BILITOT 0.4 02/09/2017   Lab Results  Component Value Date   CHOL 163 08/20/2019   HDL 48 08/20/2019   LDLCALC 103 08/20/2019   TRIG 60 08/20/2019    3. Elevated liver function tests Nicole Rasmussen denies abdominal pain.  Lab Results  Component Value Date   ALT 38 (A) 08/20/2019   AST 27 08/20/2019   ALKPHOS 59 08/20/2019   BILITOT 0.4 02/09/2017    Assessment/Plan:   1. Pre-diabetes Nicole Rasmussen will continue to work on weight loss, exercise, and decreasing simple carbohydrates to help decrease the risk of diabetes.   2. Other hyperlipidemia Cardiovascular risk and specific lipid/LDL goals reviewed.  We  discussed several lifestyle modifications today and Nicole Rasmussen will continue to work on diet, exercise and weight loss efforts. Orders and follow up as documented in patient record. Pt will bring in her labs.  Counseling Intensive lifestyle modifications are the first line treatment for this issue. . Dietary changes: Increase soluble fiber. Decrease simple carbohydrates. . Exercise changes: Moderate to vigorous-intensity aerobic activity 150 minutes per week if tolerated. . Lipid-lowering medications: see documented in medical record.  3. Elevated liver function tests We discussed the likely diagnosis of non-alcoholic fatty liver disease today and how this condition is obesity related. Nicole Rasmussen was educated the importance of weight loss. Nicole Rasmussen agreed to continue with her weight loss efforts with healthier diet and exercise as an essential part of her treatment plan.  4. Class 3 severe obesity with serious comorbidity and body mass index (BMI) of 40.0 to 44.9 in adult, unspecified obesity type [facility]) Nicole Rasmussen is currently in the action stage of change. As such, her goal is to continue with weight loss efforts. She has agreed to the Category 3 Plan.   We discussed various medication options to help Nicole Rasmussen with her weight loss efforts and we both agreed to start phentermine 15 mg, as per below. Controlled medication agreement signed. - phentermine 15 MG capsule; Take with your lunch daily  Dispense: 30 capsule; Refill: 0  Exercise goals: Continue doing 10,000 steps 5 times a week.  Behavioral modification strategies: increasing  lean protein intake, decreasing simple carbohydrates, increasing vegetables, increasing water intake, decreasing eating out, no skipping meals, meal planning and cooking strategies, keeping healthy foods in the home and planning for success.  Nicole Rasmussen has agreed to follow-up with our clinic in 2 weeks with [facility] and 4 weeks with me. She was informed of the importance of frequent follow-up  visits to maximize her success with intensive lifestyle modifications for her multiple health conditions.   Objective:   Blood pressure 120/82, pulse (!) 107, temperature 98.3 F (36.8 C), height 5\' 8"  (1.727 m), weight 280 lb (127 kg), last menstrual period 10/11/2010, SpO2 98 %. Body mass index is 42.57 kg/m.  General: Cooperative, alert, well developed, in no acute distress. HEENT: Conjunctivae and lids unremarkable. Cardiovascular: Regular rhythm.  Lungs: Normal work of breathing. Neurologic: No focal deficits.   Lab Results  Component Value Date   CREATININE 0.9 08/20/2019   BUN 19 08/20/2019   NA 142 04/25/2019   K 3.6 04/25/2019   CL 109 04/25/2019   CO2 25 04/25/2019   Lab Results  Component Value Date   ALT 38 (A) 08/20/2019   AST 27 08/20/2019   ALKPHOS 59 08/20/2019   BILITOT 0.4 02/09/2017   Lab Results  Component Value Date   HGBA1C 6.3 08/20/2019   HGBA1C 5.9 (H) 02/09/2017   Lab Results  Component Value Date   INSULIN 13.1 06/26/2019   INSULIN 9.5 02/09/2017   Lab Results  Component Value Date   TSH 1.44 08/20/2019   Lab Results  Component Value Date   CHOL 163 08/20/2019   HDL 48 08/20/2019   LDLCALC 103 08/20/2019   TRIG 60 08/20/2019   Lab Results  Component Value Date   WBC 8.8 04/25/2019   HGB 13.2 04/25/2019   HCT 41.1 04/25/2019   MCV 85.3 04/25/2019   PLT 173 04/25/2019   Lab Results  Component Value Date   FERRITIN 167.40 08/20/2019     Attestation Statements:   Reviewed by clinician on day of visit: allergies, medications, problem list, medical history, surgical history, family history, social history, and previous encounter notes.  Time spent on visit including pre-visit chart review and post-visit care and charting was 20 minutes.   Coral Ceo, am acting as Location manager for [employer], DO.  I have reviewed the above documentation for accuracy and completeness, and I agree with the above.Jearld Lesch, DO

## 2020-07-26 ENCOUNTER — Encounter (INDEPENDENT_AMBULATORY_CARE_PROVIDER_SITE_OTHER): Payer: Self-pay | Admitting: Bariatrics

## 2020-08-04 ENCOUNTER — Ambulatory Visit: Payer: 59 | Admitting: Internal Medicine

## 2020-08-05 NOTE — Progress Notes (Signed)
Cardiology Office Note:    Date:  08/06/2020   ID:  Nicole Rasmussen, DOB [birth year], MRN [id number]  PCP:  Glendon Axe, MD  Cardiologist:  No primary care provider on file.   Referring MD: Glendon Axe, MD   Chief Complaint  Patient presents with  . Chest Pain  . Shortness of Breath    History of Present Illness:    Nicole Rasmussen is a 60 y.o. female with a hx of dyspnea on exertion being referred by Glendon Axe, MD for cardiology consultation.  She gives a 38-month history of dyspnea on exertion with things such as climbing stairs or walking briskly.  This is different than 6 months ago.  She has not having palpitations.  She denies orthopnea and lower extremity swelling.  She has had intermittent episodes of chest tightness lasting 15 minutes.  These episodes are not associated with dyspnea.  They have only occurred at rest.  There is no radiation of the discomfort.  Family history significant only for a sister who had PSVT and underwent ablation.  Her mother also had an arrhythmia problem that was treated with ablation.  Patient has known history of being overweight.  Weight has been gradually increasing over time.  She does not feel she has difficulty sleeping.  No one complains of her about snoring.  Past Medical History:  Diagnosis Date  . Acid reflux   . Allergy   . Anemia   . Arthritis   . Asthma    no inhalers  . B12 deficiency   . Dysmetabolic syndrome X   . Dyspnea   . Food allergy    shellfish  . GERD (gastroesophageal reflux disease)   . HLD (hyperlipidemia)   . Joint pain    knees and back  . Lower back pain   . Nerve pain    top of right thigh  . Neuromuscular disorder ([place])    bilateral leg pain from back issue  . Prediabetes   . Shortness of breath   . Sinus problem   . Swallowing difficulty   . Vitamin B 12 deficiency   . Vitamin D deficiency     Past Surgical History:  Procedure Laterality Date  . CESAREAN SECTION     1 time  .  FOOT SURGERY Bilateral 1980's   bunionectomy and hammer toe on both feet  . MOUTH SURGERY  2011   had implants/ root canal/bridges  . WISDOM TOOTH EXTRACTION     with the implants    Current Medications: Current Meds  Medication Sig  . albuterol (VENTOLIN HFA) 108 (90 Base) MCG/ACT inhaler Inhale into the lungs every 6 (six) hours as needed for wheezing or shortness of breath.  Marland Kitchen aspirin EC 81 MG tablet Take 81 mg by mouth daily.  Marland Kitchen atorvastatin (LIPITOR) 10 MG tablet Take 10 mg by mouth daily.  . cyclobenzaprine (FLEXERIL) 10 MG tablet Take 10 mg by mouth 3 (three) times daily as needed for muscle spasms.  . fluticasone (FLONASE) 50 MCG/ACT nasal spray Place into both nostrils daily.  Marland Kitchen gabapentin (NEURONTIN) 300 MG capsule Take 1 capsule (300 mg total) by mouth 3 (three) times daily. Can take once daily or 2 times a day, up to 3 times a day.  . Magnesium 250 MG TABS Take by mouth.   Current Facility-Administered Medications for the 08/06/20 encounter (Office Visit) with Belva Crome, MD  Medication  . 0.9 %  sodium chloride infusion     Allergies:  Shellfish allergy, Latex, and Sulfonamide derivatives   Social History   Socioeconomic History  . Marital status: Married    Spouse name: Izell Montrose  . Number of children: 1  . Years of education: BA  . Highest education level: Not on file  Occupational History  . Occupation: Retired, Engineer, maintenance: [employer]  Tobacco Use  . Smoking status: Former Smoker    Packs/day: 1.00    Years: 20.00    Pack years: 20.00    Types: Cigarettes    Quit date: 05/02/1996    Years since quitting: 24.2  . Smokeless tobacco: Never Used  Vaping Use  . Vaping Use: Never used  Substance and Sexual Activity  . Alcohol use: Yes    Comment: social  . Drug use: No  . Sexual activity: Not on file  Other Topics Concern  . Not on file  Social History Narrative   Occasional caffeine use    Social Determinants of Health    Financial Resource Strain: Not on file  Food Insecurity: Not on file  Transportation Needs: Not on file  Physical Activity: Not on file  Stress: Not on file  Social Connections: Not on file     Family History: The patient's family history includes Breast cancer in her mother; Cancer in her father and mother; Diabetes in her sister; Heart disease in her mother; Hyperlipidemia in her brother, mother, and sister; Hypertension in her brother, brother, brother, brother, mother, sister, sister, sister, and sister; Kidney disease in her father; Prostate cancer in her brother and father.  ROS:   Please see the history of present illness.    She has a right knee problem that prevents ambulating and physical activity as much as she would like.  She does acknowledge that her weight is increasing.  She has a spot on her lung that is being followed by Dr. Beckie Salts of pulmonary.  She has a 20+ year smoking history.  Discontinued smoking 20 years ago.  All other systems reviewed and are negative.  EKGs/Labs/Other Studies Reviewed:    The following studies were reviewed today: No new cardiac or functional data.  2D Doppler echocardiogram done by Dr. Tamala Julian at [facility] was "without significant abnormality".  EKG:  EKG normal sinus rhythm, heart rate 92 bpm.  In appearance.  Borderline left atrial abnormality.  Recent Labs: 08/20/2019: ALT 38; BUN 19; Creatinine 0.9; TSH 1.44  Recent Lipid Panel    Component Value Date/Time   CHOL 163 08/20/2019 0000   CHOL 216 (H) 02/09/2017 1250   TRIG 60 08/20/2019 0000   HDL 48 08/20/2019 0000   HDL 43 02/09/2017 1250   LDLCALC 103 08/20/2019 0000   LDLCALC 160 (H) 02/09/2017 1250    Physical Exam:    VS:  BP 100/88   Pulse 92   Ht 5\' 8"  (1.727 m)   Wt 279 lb (126.6 kg)   LMP 10/11/2010   SpO2 98%   BMI 42.42 kg/m     Wt Readings from Last 3 Encounters:  08/06/20 279 lb (126.6 kg)  07/22/20 280 lb (127 kg)  06/25/20 282 lb (127.9  kg)     GEN. No acute distress.  Morbid obesity. HEENT: Normal NECK: No JVD. LYMPHATICS: No lymphadenopathy CARDIAC: No murmur. RRR no gallop, or edema. VASCULAR:  Normal Pulses. No bruits. RESPIRATORY:  Clear to auscultation without rales, wheezing or rhonchi  ABDOMEN: Soft, non-tender, non-distended, No pulsatile mass, MUSCULOSKELETAL: No deformity  SKIN: Warm  and dry NEUROLOGIC:  Alert and oriented x 3 PSYCHIATRIC:  Normal affect   ASSESSMENT:    1. Shortness of breath on exertion   2. Palpitations   3. Chest tightness   4. HYPERCHOLESTEROLEMIA   5. Class 3 severe obesity with serious comorbidity and body mass index (BMI) of 40.0 to 44.9 in adult, unspecified obesity type ([place])   6. Prediabetes    PLAN:    In order of problems listed above:  1. BNP.  Get a copy of echo report from Dr. Glendon Axe [facility].  Should probably have pulmonary function testing done to exclude the possibility of COPD. 2. She is not having palpitations she has had palpitations in the past. 3. Exercise treadmill test to rule out ischemia and assess exertional tolerance and blood pressure response. 4. Hyperlipidemia is being managed with Lipitor 10 mg/day.  Most recent LDL was 103 in April 2021. 5. Encourage decrease caloric intake, increase physical activity, 6. Encourage primary cardiovascular disease prevention strategy as outlined below.  Overall education and awareness concerning primary  risk prevention was discussed in detail: LDL less than 70, hemoglobin A1c less than 7, blood pressure target less than 130/80 mmHg, >150 minutes of moderate aerobic activity per week, avoidance of smoking, weight control (via diet and exercise), and continued surveillance/management of/for obstructive sleep apnea.    Medication Adjustments/Labs and Tests Ordered: Current medicines are reviewed at length with the patient today.  Concerns regarding medicines are outlined above.  Orders Placed This  Encounter  Procedures  . EKG 12-Lead   No orders of the defined types were placed in this encounter.   There are no Patient Instructions on file for this visit.   Signed, Sinclair Grooms, MD  08/06/2020 11:58 AM    [place]

## 2020-08-06 ENCOUNTER — Encounter: Payer: Self-pay | Admitting: Interventional Cardiology

## 2020-08-06 ENCOUNTER — Other Ambulatory Visit: Payer: Self-pay

## 2020-08-06 ENCOUNTER — Ambulatory Visit: Payer: 59 | Admitting: Interventional Cardiology

## 2020-08-06 ENCOUNTER — Ambulatory Visit (INDEPENDENT_AMBULATORY_CARE_PROVIDER_SITE_OTHER): Payer: 59 | Admitting: Family Medicine

## 2020-08-06 VITALS — BP 100/88 | HR 92 | Ht 68.0 in | Wt 279.0 lb

## 2020-08-06 DIAGNOSIS — R0789 Other chest pain: Secondary | ICD-10-CM

## 2020-08-06 DIAGNOSIS — R0602 Shortness of breath: Secondary | ICD-10-CM | POA: Diagnosis not present

## 2020-08-06 DIAGNOSIS — R7303 Prediabetes: Secondary | ICD-10-CM

## 2020-08-06 DIAGNOSIS — Z6841 Body Mass Index (BMI) 40.0 and over, adult: Secondary | ICD-10-CM

## 2020-08-06 DIAGNOSIS — E7800 Pure hypercholesterolemia, unspecified: Secondary | ICD-10-CM

## 2020-08-06 DIAGNOSIS — R002 Palpitations: Secondary | ICD-10-CM

## 2020-08-06 NOTE — Patient Instructions (Signed)
Medication Instructions:  Your physician recommends that you continue on your current medications as directed. Please refer to the Current Medication list given to you today.  *If you need a refill on your cardiac medications before your next appointment, please call your pharmacy*   Lab Work: Pro BNP today  If you have labs (blood work) drawn today and your tests are completely normal, you will receive your results only by: Marland Kitchen MyChart Message (if you have MyChart) OR . A paper copy in the mail If you have any lab test that is abnormal or we need to change your treatment, we will call you to review the results.   Testing/Procedures: Your physician has requested that you have an exercise tolerance test. For further information please visit [email or web address]. Please also follow instruction sheet, as given.   Follow-Up: At [facility], you and your health needs are our priority.  As part of our continuing mission to provide you with exceptional heart care, we have created designated Provider Care Teams.  These Care Teams include your primary Cardiologist (physician) and Advanced Practice Providers (APPs -  Physician Assistants and Nurse Practitioners) who all work together to provide you with the care you need, when you need it.  We recommend signing up for the patient portal called "MyChart".  Sign up information is provided on this After Visit Summary.  MyChart is used to connect with patients for Virtual Visits (Telemedicine).  Patients are able to view lab/test results, encounter notes, upcoming appointments, etc.  Non-urgent messages can be sent to your provider as well.   To learn more about what you can do with MyChart, go to [email or web address].    Your next appointment:   As needed  The format for your next appointment:   In Person  Provider:   You may see Dr. Daneen Schick or one of the following Advanced Practice Providers on your designated Care Team:    Kathyrn Drown, NP    Other Instructions

## 2020-08-07 LAB — PRO B NATRIURETIC PEPTIDE: NT-Pro BNP: 5 pg/mL (ref 0–287)

## 2020-08-11 ENCOUNTER — Other Ambulatory Visit (HOSPITAL_COMMUNITY): Payer: 59

## 2020-08-25 ENCOUNTER — Other Ambulatory Visit (HOSPITAL_COMMUNITY): Payer: 59

## 2020-08-26 ENCOUNTER — Ambulatory Visit (INDEPENDENT_AMBULATORY_CARE_PROVIDER_SITE_OTHER): Payer: 59 | Admitting: Bariatrics

## 2020-08-27 ENCOUNTER — Ambulatory Visit (INDEPENDENT_AMBULATORY_CARE_PROVIDER_SITE_OTHER): Payer: 59

## 2020-08-27 ENCOUNTER — Other Ambulatory Visit: Payer: Self-pay

## 2020-08-27 DIAGNOSIS — R0602 Shortness of breath: Secondary | ICD-10-CM

## 2020-08-27 LAB — EXERCISE TOLERANCE TEST
Estimated workload: 7 METS
Exercise duration (min): 5 min
Exercise duration (sec): 0 s
MPHR: 159 {beats}/min
Peak HR: 142 {beats}/min
Percent HR: 89 %
RPE: 18
Rest HR: 83 {beats}/min

## 2020-09-03 ENCOUNTER — Ambulatory Visit: Payer: 59 | Admitting: Pulmonary Disease

## 2020-09-09 ENCOUNTER — Ambulatory Visit: Payer: 59 | Admitting: Pulmonary Disease

## 2020-09-11 ENCOUNTER — Telehealth (HOSPITAL_COMMUNITY): Payer: Self-pay | Admitting: Interventional Cardiology

## 2020-09-11 NOTE — Telephone Encounter (Signed)
Patient called this morning and cancelled her Echocardiogram and states that he told her that she did not need this test since her GXT was normal.  Order will be removed from the ECHO WQ and If pt needs we will reinstate the order. Thank you.

## 2020-09-14 ENCOUNTER — Other Ambulatory Visit (HOSPITAL_COMMUNITY): Payer: 59

## 2020-09-24 ENCOUNTER — Ambulatory Visit: Payer: 59 | Admitting: Pulmonary Disease

## 2020-09-24 ENCOUNTER — Encounter: Payer: Self-pay | Admitting: Pulmonary Disease

## 2020-09-24 ENCOUNTER — Other Ambulatory Visit: Payer: Self-pay

## 2020-09-24 VITALS — BP 128/84 | HR 104 | Temp 97.4°F | Ht 68.0 in | Wt 285.0 lb

## 2020-09-24 DIAGNOSIS — I272 Pulmonary hypertension, unspecified: Secondary | ICD-10-CM | POA: Diagnosis not present

## 2020-09-24 NOTE — Patient Instructions (Addendum)
Continue using albuterol as needed  We will get a breathing study on you during your next visit  We will order a CT scan of the chest to check on the lung nodule  Graded exercises as tolerated for weight loss  I will see you back in about 6 weeks  Call with significant concerns

## 2020-09-24 NOTE — Progress Notes (Signed)
Subjective:     Patient ID: Nicole Rasmussen, female   DOB: 05/02/1961, 60 y.o.   MRN: [id number]  Patient being seen for an abnormal CT scan of the chest  Shortness of breath with exertion  Shortness of breath with exertion Has gained some weight recently from decreased activity  Recent echocardiogram reviewed with the patient within normal limits Previous spirometry within normal limits  She does have a history of asthma Rarely needs albuterol  CT and PET scan previously did not show significant abnormality in the lung nodule  CT reveals a 9 mm right upper lobe nodule-from 2021 Nodule size remains unchanged on the PET scan  She is a reformed smoker  No personal history of cancer No family history of lung cancer  No pertinent occupational history No increased risk profile for lung cancer    Only requires albuterol about once or twice a week    Review of Systems  Constitutional: Negative.   Respiratory: Negative for cough, shortness of breath and wheezing.   Cardiovascular: Negative.  Negative for chest pain.  Gastrointestinal: Negative.   Endocrine: Negative.   Genitourinary: Negative.   Musculoskeletal: Negative for back pain.  Skin: Negative.   Hematological: Negative.   Psychiatric/Behavioral: Negative.    Past Medical History:  Diagnosis Date  . Acid reflux   . Allergy   . Anemia   . Arthritis   . Asthma    no inhalers  . B12 deficiency   . Dysmetabolic syndrome X   . Dyspnea   . Food allergy    shellfish  . GERD (gastroesophageal reflux disease)   . HLD (hyperlipidemia)   . Joint pain    knees and back  . Lower back pain   . Nerve pain    top of right thigh  . Neuromuscular disorder ([place])    bilateral leg pain from back issue  . Prediabetes   . Shortness of breath   . Sinus problem   . Swallowing difficulty   . Vitamin B 12 deficiency   . Vitamin D deficiency    Social History   Socioeconomic History  . Marital status: Married     Spouse name: Izell [place]  . Number of children: 1  . Years of education: BA  . Highest education level: Not on file  Occupational History  . Occupation: Retired, Engineer, maintenance: [employer]  Tobacco Use  . Smoking status: Former Smoker    Packs/day: 1.00    Years: 20.00    Pack years: 20.00    Types: Cigarettes    Quit date: 05/02/1996    Years since quitting: 24.4  . Smokeless tobacco: Never Used  Vaping Use  . Vaping Use: Never used  Substance and Sexual Activity  . Alcohol use: Yes    Comment: social  . Drug use: No  . Sexual activity: Not on file  Other Topics Concern  . Not on file  Social History Narrative   Occasional caffeine use    Social Determinants of Health   Financial Resource Strain: Not on file  Food Insecurity: Not on file  Transportation Needs: Not on file  Physical Activity: Not on file  Stress: Not on file  Social Connections: Not on file  Intimate Partner Violence: Not on file   Family History  Problem Relation Age of Onset  . Cancer Mother   . Hypertension Mother   . Hyperlipidemia Mother   . Heart disease Mother   . Breast cancer Mother   .  Cancer Father   . Kidney disease Father   . Prostate cancer Father   . Hyperlipidemia Sister   . Diabetes Sister   . Hypertension Sister   . Hyperlipidemia Brother   . Hypertension Brother   . Prostate cancer Brother   . Hypertension Sister   . Hypertension Sister   . Hypertension Sister   . Hypertension Brother   . Hypertension Brother   . Hypertension Brother       Objective:   Physical Exam Constitutional:      Appearance: Normal appearance.  HENT:     Head: Normocephalic and atraumatic.  Eyes:     General:        Right eye: No discharge.        Left eye: No discharge.  Cardiovascular:     Rate and Rhythm: Normal rate and regular rhythm.     Heart sounds: No murmur heard. No friction rub.  Pulmonary:     Effort: Pulmonary effort is normal. No respiratory distress.      Breath sounds: Normal breath sounds. No stridor. No wheezing or rhonchi.  Musculoskeletal:     Cervical back: No rigidity or tenderness. No muscular tenderness.  Neurological:     Mental Status: She is alert.  Psychiatric:        Mood and Affect: Mood normal.    CT scan from July 2019 reviewed with the patient CT scan from June 2020 reviewed by myself CT scan from 2021 shows normal lung nodule PET scan was reviewed with the patient Nodule remains about 9 mm    Assessment:     Abnormal CT scan of the chest showing a 9 mm right lung nodule PET scan reviewed with the patient showing no significant activity -CT is unchanged -Will need repeat, last was about a year ago  Shortness of breath -Plan for pulmonary function test  Obesity  -Needs to continue working on weight loss  I will see her back in about 6 weeks  History of asthma -Only using albuterol once or twice a week     Plan:     Will repeat CT scan of the chest  Obtain pulmonary function test  I will see her back in about 6 weeks

## 2020-10-01 ENCOUNTER — Encounter (HOSPITAL_COMMUNITY): Payer: Self-pay | Admitting: Emergency Medicine

## 2020-10-01 ENCOUNTER — Emergency Department (HOSPITAL_COMMUNITY)
Admission: EM | Admit: 2020-10-01 | Discharge: 2020-10-01 | Disposition: A | Discharge status: Home / Self Care | Payer: 59 | Attending: Emergency Medicine | Admitting: Emergency Medicine

## 2020-10-01 ENCOUNTER — Emergency Department (HOSPITAL_BASED_OUTPATIENT_CLINIC_OR_DEPARTMENT_OTHER): Payer: 59

## 2020-10-01 ENCOUNTER — Other Ambulatory Visit: Payer: Self-pay

## 2020-10-01 ENCOUNTER — Emergency Department (HOSPITAL_COMMUNITY): Payer: 59

## 2020-10-01 DIAGNOSIS — Z91040 Latex allergy status: Secondary | ICD-10-CM | POA: Diagnosis not present

## 2020-10-01 DIAGNOSIS — K219 Gastro-esophageal reflux disease without esophagitis: Secondary | ICD-10-CM | POA: Insufficient documentation

## 2020-10-01 DIAGNOSIS — R0602 Shortness of breath: Secondary | ICD-10-CM | POA: Diagnosis not present

## 2020-10-01 DIAGNOSIS — R1013 Epigastric pain: Secondary | ICD-10-CM | POA: Diagnosis not present

## 2020-10-01 DIAGNOSIS — R109 Unspecified abdominal pain: Secondary | ICD-10-CM | POA: Diagnosis present

## 2020-10-01 DIAGNOSIS — R609 Edema, unspecified: Secondary | ICD-10-CM | POA: Diagnosis not present

## 2020-10-01 DIAGNOSIS — R079 Chest pain, unspecified: Secondary | ICD-10-CM | POA: Diagnosis not present

## 2020-10-01 DIAGNOSIS — Z7951 Long term (current) use of inhaled steroids: Secondary | ICD-10-CM | POA: Insufficient documentation

## 2020-10-01 DIAGNOSIS — Z87891 Personal history of nicotine dependence: Secondary | ICD-10-CM | POA: Insufficient documentation

## 2020-10-01 DIAGNOSIS — J45909 Unspecified asthma, uncomplicated: Secondary | ICD-10-CM | POA: Insufficient documentation

## 2020-10-01 DIAGNOSIS — Z7982 Long term (current) use of aspirin: Secondary | ICD-10-CM | POA: Diagnosis not present

## 2020-10-01 LAB — CBC WITH DIFFERENTIAL/PLATELET
Abs Immature Granulocytes: 0.02 10*3/uL (ref 0.00–0.07)
Basophils Absolute: 0 10*3/uL (ref 0.0–0.1)
Basophils Relative: 1 %
Eosinophils Absolute: 0.2 10*3/uL (ref 0.0–0.5)
Eosinophils Relative: 3 %
HCT: 42.3 % (ref 36.0–46.0)
Hemoglobin: 13.3 g/dL (ref 12.0–15.0)
Immature Granulocytes: 0 %
Lymphocytes Relative: 38 %
Lymphs Abs: 2.9 10*3/uL (ref 0.7–4.0)
MCH: 27 pg (ref 26.0–34.0)
MCHC: 31.4 g/dL (ref 30.0–36.0)
MCV: 85.8 fL (ref 80.0–100.0)
Monocytes Absolute: 0.7 10*3/uL (ref 0.1–1.0)
Monocytes Relative: 9 %
Neutro Abs: 3.8 10*3/uL (ref 1.7–7.7)
Neutrophils Relative %: 49 %
Platelets: UNDETERMINED 10*3/uL (ref 150–400)
RBC: 4.93 MIL/uL (ref 3.87–5.11)
RDW: 13.5 % (ref 11.5–15.5)
WBC: 7.7 10*3/uL (ref 4.0–10.5)
nRBC: 0 % (ref 0.0–0.2)

## 2020-10-01 LAB — COMPREHENSIVE METABOLIC PANEL
ALT: 19 U/L (ref 0–44)
AST: 19 U/L (ref 15–41)
Albumin: 3.8 g/dL (ref 3.5–5.0)
Alkaline Phosphatase: 71 U/L (ref 38–126)
Anion gap: 8 (ref 5–15)
BUN: 11 mg/dL (ref 8–23)
CO2: 27 mmol/L (ref 22–32)
Calcium: 9.2 mg/dL (ref 8.9–10.3)
Chloride: 106 mmol/L (ref 98–111)
Creatinine, Ser: 0.73 mg/dL (ref 0.44–1.00)
GFR, Estimated: >60 mL/min (ref >60)
Glucose, Bld: 92 mg/dL (ref 70–99)
Potassium: 3.5 mmol/L (ref 3.5–5.1)
Sodium: 141 mmol/L (ref 135–145)
Total Bilirubin: 0.9 mg/dL (ref 0.3–1.2)
Total Protein: 7.1 g/dL (ref 6.5–8.1)

## 2020-10-01 LAB — TROPONIN I (HIGH SENSITIVITY)
Troponin I (High Sensitivity): 4 ng/L (ref <18)
Troponin I (High Sensitivity): 4 ng/L (ref <18)

## 2020-10-01 LAB — LIPASE, BLOOD: Lipase: 26 U/L (ref 11–51)

## 2020-10-01 NOTE — ED Provider Notes (Signed)
Emergency Medicine Provider Triage Evaluation Note  Toshie Demelo , a 60 y.o. female  was evaluated in triage.  Pt complains of central chest/upper abdominal pain.  Symptoms have been going on for the past 4 days.  Noticed some right lower extremity swelling for the past few weeks.  Denies any shortness of breath, cough.  Review of Systems  Positive: Chest pain Negative: Shortness of breath, cough  Physical Exam  BP (!) 145/87   Pulse 92   Temp 98.9 F (37.2 C)   Resp 16   LMP 10/11/2010   SpO2 99%  Gen:   Awake, no distress Resp:  Normal effort MSK:   Moves extremities without difficulty Other:  Tenderness palpation of the epigastric/lower chest area centrally.  Right calf pain  Medical Decision Making  Medically screening exam initiated at 4:22 PM.  Appropriate orders placed.  Zyla Dascenzo was informed that the remainder of the evaluation will be completed by another provider, this initial triage assessment does not replace that evaluation, and the importance of remaining in the ED until their evaluation is complete.  Will order lab work, abdomen labs as well as troponin and ultrasound to rule out DVT   Delia Heady, PA-C 10/01/20 1625    Blanchie Dessert, MD 10/02/20 [phone]

## 2020-10-01 NOTE — ED Notes (Signed)
Patient transported to Ultrasound 

## 2020-10-01 NOTE — Discharge Instructions (Signed)
Follow-up with GI as planned.  Take the omeprazole that you have been recently prescribed.

## 2020-10-01 NOTE — ED Triage Notes (Signed)
Pt reports mid cp that started Monday with wheezing/sob. Also rt leg swelling. Pt reports using inhalers with some relief. Pt reports mid rt abd pain that's been going on for a few months, denies n/v/d. Pt reports burning sensation up to chest. Pt reports when she moves a certain way she gets worsening mid abd pain that shoots to the back.

## 2020-10-01 NOTE — ED Notes (Signed)
E-signature pad unavailable at time of pt discharge. This RN discussed discharge materials with pt and answered all pt questions. Pt stated understanding of discharge material. ? ?

## 2020-10-01 NOTE — Progress Notes (Signed)
RLE venous duplex       has been completed. Preliminary results can be found under CV proc through chart review. Tejas Seawood, BS, RDMS, RVT   

## 2020-10-08 NOTE — ED Provider Notes (Signed)
[facility] Provider Note   CSN: [id number] Arrival date & time: 10/01/20  1603     History Chief Complaint  Patient presents with   Chest Pain   Abdominal Pain    Nicole Rasmussen is a 60 y.o. female.   Chest Pain Associated symptoms: abdominal pain and shortness of breath   Associated symptoms: no back pain, no dysphagia, no nausea, no vomiting and no weakness   Abdominal Pain Associated symptoms: chest pain and shortness of breath   Associated symptoms: no nausea and no vomiting   Patient with chest pain and shortness of breath.  Has had for couple days now.  Some wheezing shortness of breath.  Has a history of asthma and has been using inhaler with some relief.  Some pain in her chest.  Also with his primary goal points in her upper abdomen.  Make it worse with eating.  Sometimes worse with certain positions.  Not necessarily worsened with eating.  Still has her gallbladder.  No fevers or chills.  No cough.  No pain with exertion.  Abdominal pain has been there for months.    Past Medical History:  Diagnosis Date   Acid reflux    Allergy    Anemia    Arthritis    Asthma    no inhalers   B12 deficiency    Dysmetabolic syndrome X    Dyspnea    Food allergy    shellfish   GERD (gastroesophageal reflux disease)    HLD (hyperlipidemia)    Joint pain    knees and back   Lower back pain    Nerve pain    top of right thigh   Neuromuscular disorder ([place])    bilateral leg pain from back issue   Prediabetes    Shortness of breath    Sinus problem    Swallowing difficulty    Vitamin B 12 deficiency    Vitamin D deficiency     Patient Active Problem List   Diagnosis Date Noted   Osteoarthritis of right knee 03/16/2020   Prediabetes 08/26/2019   Class 3 severe obesity with serious comorbidity and body mass index (BMI) of 40.0 to 44.9 in adult [facility]) 07/11/2019   Recurrent sinusitis 10/19/2018   Other fatigue 02/09/2017    Shortness of breath on exertion 02/09/2017   Hyperglycemia 02/09/2017   Vitamin D deficiency 02/09/2017   Dysesthesia 01/27/2016   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 01/27/2016   Lumbosacral radiculopathy at L5 01/27/2016   Headache 06/08/2013   Atypical chest pain 06/08/2013   HYPERCHOLESTEROLEMIA 09/17/2007   ARTHRITIS 09/17/2007   HEARTBURN 09/17/2007    Past Surgical History:  Procedure Laterality Date   CESAREAN SECTION     1 time   FOOT SURGERY Bilateral 1980's   bunionectomy and hammer toe on both feet   MOUTH SURGERY  2011   had implants/ root canal/bridges   WISDOM TOOTH EXTRACTION     with the implants     OB History     Gravida  4   Para      Term      Preterm      AB  3   Living  1      SAB  3   IAB      Ectopic      Multiple      Live Births              Family  History  Problem Relation Age of Onset   Cancer Mother    Hypertension Mother    Hyperlipidemia Mother    Heart disease Mother    Breast cancer Mother    Cancer Father    Kidney disease Father    Prostate cancer Father    Hyperlipidemia Sister    Diabetes Sister    Hypertension Sister    Hyperlipidemia Brother    Hypertension Brother    Prostate cancer Brother    Hypertension Sister    Hypertension Sister    Hypertension Sister    Hypertension Brother    Hypertension Brother    Hypertension Brother     Social History   Tobacco Use   Smoking status: Former    Packs/day: 1.00    Years: 20.00    Pack years: 20.00    Types: Cigarettes    Quit date: 05/02/1996    Years since quitting: 24.4   Smokeless tobacco: Never  Vaping Use   Vaping Use: Never used  Substance Use Topics   Alcohol use: Yes    Comment: social   Drug use: No    Home Medications Prior to Admission medications   Medication Sig Start Date End Date Taking? Authorizing Provider  albuterol (VENTOLIN HFA) 108 (90 Base) MCG/ACT inhaler Inhale into the lungs every  6 (six) hours as needed for wheezing or shortness of breath.   Yes [provider]  aspirin EC 81 MG tablet Take 81 mg by mouth daily.   Yes [provider]  atorvastatin (LIPITOR) 10 MG tablet Take 10 mg by mouth daily.   Yes [provider]  cyclobenzaprine (FLEXERIL) 10 MG tablet Take 10 mg by mouth 3 (three) times daily as needed for muscle spasms.   Yes [provider]  fluticasone (FLONASE) 50 MCG/ACT nasal spray Place into both nostrils daily.   Yes [provider]  gabapentin (NEURONTIN) 300 MG capsule Take 1 capsule (300 mg total) by mouth 3 (three) times daily. Can take once daily or 2 times a day, up to 3 times a day. 04/18/16  Yes Star Age, MD  Magnesium 250 MG TABS Take 250 mg by mouth daily.   Yes [provider]  meloxicam (MOBIC) 15 MG tablet Take 15 mg by mouth daily. 09/06/20  Yes [provider]  omeprazole (PRILOSEC) 40 MG capsule Take 40 mg by mouth daily. 09/29/20  Yes [provider]    Allergies    Shellfish allergy, Latex, and Sulfonamide derivatives  Review of Systems   Review of Systems  Constitutional:  Negative for appetite change.  HENT:  Negative for congestion and trouble swallowing.   Respiratory:  Positive for shortness of breath.   Cardiovascular:  Positive for chest pain.  Gastrointestinal:  Positive for abdominal pain. Negative for nausea and vomiting.  Endocrine: Negative for polyphagia.  Genitourinary:  Negative for urgency.  Musculoskeletal:  Negative for back pain.  Skin:  Negative for rash.  Neurological:  Negative for weakness.   Physical Exam Updated Vital Signs BP 120/85   Pulse 80   Temp 98.8 F (37.1 C) (Oral)   Resp 18   Ht 5\' 8"  (1.727 m)   Wt 127 kg   LMP 10/11/2010   SpO2 100%   BMI 42.57 kg/m   Physical Exam Vitals and nursing note reviewed.  HENT:     Head: Atraumatic.  Eyes:     Pupils: Pupils are equal, round, and reactive to light.   Cardiovascular:  Rate and Rhythm: Normal rate.  Pulmonary:     Breath sounds: No wheezing, rhonchi or rales.  Chest:     Chest wall: No tenderness.  Abdominal:     Comments: Mild epigastric to right upper quadrant tenderness without rebound or guarding.  No hernia palpated.  Musculoskeletal:     Cervical back: Neck supple.     Left lower leg: Edema present.     Comments: Mild edema of left lower extremity.  Skin:    General: Skin is warm.  Neurological:     Mental Status: She is alert and oriented to person, place, and time.    ED Results / Procedures / Treatments   Labs (all labs ordered are listed, but only abnormal results are displayed) Labs Reviewed  COMPREHENSIVE METABOLIC PANEL  LIPASE, BLOOD  CBC WITH DIFFERENTIAL/PLATELET  TROPONIN I (HIGH SENSITIVITY)  TROPONIN I (HIGH SENSITIVITY)    EKG EKG Interpretation  Date/Time:  Thursday October 01 2020 16:07:25 EDT Ventricular Rate:  96 PR Interval:  184 QRS Duration: 88 QT Interval:  350 QTC Calculation: 442 R Axis:   8 Text Interpretation: Normal sinus rhythm Possible Anterior infarct , age undetermined Abnormal ECG No significant change since last tracing Confirmed by Davonna Belling [phone]) on 10/01/2020 11:42:15 PM  Radiology No results found.  Procedures Procedures   Medications Ordered in ED Medications - No data to display  ED Course  I have reviewed the triage vital signs and the nursing notes.  Pertinent labs & imaging results that were available during my care of the patient were reviewed by me and considered in my medical decision making (see chart for details).    MDM Rules/Calculators/A&P                          Patient with chest and epigastric pain.  Has been going on for months.  Potentially associated with eating.  Some tenderness.  Ultrasound done and reassuring for right upper quadrant.  Cardiac work-up reassuring.  Doubt pulm embolism.  Doubt anginal equivalent.  Patient appears  stable for outpatient follow-up with PCP. Final Clinical Impression(s) / ED Diagnoses Final diagnoses:  Abdominal pain  Epigastric pain    Rx / DC Orders ED Discharge Orders     None        Davonna Belling, MD 10/08/20 2031

## 2020-10-13 ENCOUNTER — Other Ambulatory Visit: Payer: 59

## 2020-10-21 ENCOUNTER — Ambulatory Visit: Payer: 59 | Admitting: Primary Care

## 2020-10-21 ENCOUNTER — Other Ambulatory Visit: Payer: Self-pay

## 2020-10-21 DIAGNOSIS — R911 Solitary pulmonary nodule: Secondary | ICD-10-CM

## 2020-10-21 NOTE — Progress Notes (Signed)
Peer-to-peer for CT chest wo contrast to monitor RUL pulmonary nodule. Patient is former smoker, 20 pack year hx. She has only had 22 months surveillance   11/11/17 CTA showed right upper lobe 86mm ground-glass pulmonary nodule. If persistent recommend repeat CT every 2 years until 5 years stability. This recommendations follows the consensus statement: Guidelines for Management of Incidental Pulmonary Nodules Detected on CT Images: From the Fleischner Society 2017  Last CT chest was 1 year ago 09/10/19 showed stable 9 x 8 nodular opacity posterior segment right upper lobe. Stability over approximate 2 year time span suggests benign etiology. Follow-up in 6-74months to complete 2 year screening interval may be reasonable. Also 63mm nodular opacity right apex.   Case expired, needs new order.  New case number [id number]  Approved- Auth [id number]

## 2020-10-22 ENCOUNTER — Ambulatory Visit: Payer: 59 | Admitting: Gastroenterology

## 2020-10-22 ENCOUNTER — Encounter: Payer: Self-pay | Admitting: Gastroenterology

## 2020-10-22 VITALS — BP 132/92 | HR 106 | Ht 68.0 in | Wt 284.4 lb

## 2020-10-22 DIAGNOSIS — R1310 Dysphagia, unspecified: Secondary | ICD-10-CM | POA: Insufficient documentation

## 2020-10-22 DIAGNOSIS — R1013 Epigastric pain: Secondary | ICD-10-CM | POA: Diagnosis not present

## 2020-10-22 DIAGNOSIS — K219 Gastro-esophageal reflux disease without esophagitis: Secondary | ICD-10-CM

## 2020-10-22 NOTE — Progress Notes (Signed)
10/22/2020 Nicole Rasmussen [id number] May 22, 1960   HISTORY OF PRESENT ILLNESS: This is a 60 year old female is a patient Dr. Celesta Aver.  She presents here today with complaints of difficulty swallowing.  She tells me she has had acid reflux for years.  She says that its been really bad again for about the past 4 to 6 months.  She has been having a lot of trouble with food getting stuck when she swallows them, but meats are definitely the worst.  She does not really have any trouble swallowing liquids.  She says that when food gets stuck it is very painful.  She has had some epigastric abdominal pain and substernal chest pain as well.  That worsens with eating, but nonetheless prompted cardiac evaluation.  She underwent extensive cardiac work-up without any significant findings.  She was just recently placed back on omeprazole 40 mg daily less than a month ago.  EGD in August 2009 showed erosive duodenitis, possible gastritis.  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  Gastric biopsy showed benign gastric mucosa with mild chronic gastritis and no H. pylori.   Past Medical History:  Diagnosis Date   Acid reflux    Allergy    Anemia    Arthritis    Asthma    no inhalers   B12 deficiency    Dysmetabolic syndrome X    Dyspnea    Food allergy    shellfish   GERD (gastroesophageal reflux disease)    HLD (hyperlipidemia)    Joint pain    knees and back   Lower back pain    Nerve pain    top of right thigh   Neuromuscular disorder ([facility])    bilateral leg pain from back issue   Prediabetes    Shortness of breath    Sinus problem    Swallowing difficulty    Vitamin B 12 deficiency    Vitamin D deficiency    Past Surgical History:  Procedure Laterality Date   CESAREAN SECTION     1 time   FOOT SURGERY Bilateral 1980's   bunionectomy and hammer toe on both feet    MOUTH SURGERY  2011   had implants/ root canal/bridges   WISDOM TOOTH EXTRACTION     with the implants    reports that she quit smoking about 24 years ago. Her smoking use included cigarettes. She has a 20.00 pack-year smoking history. She has never used smokeless tobacco. She reports current alcohol use. She reports that she does not use drugs. family history includes Breast cancer in her mother; Cancer in her father and mother; Diabetes in her sister; Heart disease in her mother; Hyperlipidemia in her brother, mother, and sister; Hypertension in her brother, brother, brother, brother, mother, sister, sister, sister, and sister; Kidney disease in her father; Prostate cancer in her brother and father. Allergies  Allergen Reactions   Shellfish Allergy Hives and Swelling   Latex Hives    hives   Sulfonamide Derivatives Other (See Comments)    Rash, itching      Outpatient Encounter Medications as of 10/22/2020  Medication Sig   albuterol (VENTOLIN HFA) 108 (90 Base) MCG/ACT inhaler Inhale into the lungs every 6 (six) hours as needed for wheezing or shortness of breath.   aspirin EC 81 MG tablet Take 81 mg by mouth daily.   atorvastatin (LIPITOR) 10 MG tablet Take 10  mg by mouth daily.   cyclobenzaprine (FLEXERIL) 10 MG tablet Take 10 mg by mouth 3 (three) times daily as needed for muscle spasms.   fluticasone (FLONASE) 50 MCG/ACT nasal spray Place into both nostrils daily.   gabapentin (NEURONTIN) 300 MG capsule Take 1 capsule (300 mg total) by mouth 3 (three) times daily. Can take once daily or 2 times a day, up to 3 times a day.   Magnesium 250 MG TABS Take 250 mg by mouth daily.   meloxicam (MOBIC) 15 MG tablet Take 15 mg by mouth daily.   omeprazole (PRILOSEC) 40 MG capsule Take 40 mg by mouth daily.   Facility-Administered Encounter Medications as of 10/22/2020  Medication   0.9 %  sodium chloride infusion    REVIEW OF SYSTEMS  : All other systems reviewed and negative except where  noted in the History of Present Illness.   PHYSICAL EXAM: BP (!) 132/92 (BP Location: Right Arm, Patient Position: Sitting, Cuff Size: Large)   Pulse (!) 106   Ht 5\' 8"  (1.727 m)   Wt 284 lb 6 oz (129 kg)   LMP 10/11/2010   SpO2 97%   BMI 43.24 kg/m  General: Well developed AA female in no acute distress Head: Normocephalic and atraumatic Eyes:  Sclerae anicteric, conjunctiva pink. Ears: Normal auditory acuity Lungs: Clear throughout to auscultation; no W/R/R. Heart: Slightly tachy; no M/R/G. Abdomen: Soft, non-distended.  BS present.  Mild epigastric TTP. Musculoskeletal: Symmetrical with no gross deformities  Skin: No lesions on visible extremities Extremities: No edema  Neurological: Alert oriented x 4, grossly non-focal Psychological:  Alert and cooperative. Normal mood and affect  ASSESSMENT AND PLAN: *Dysphagia to solid foods, meats are the worst, epigastric abdominal pain, GERD: We will plan for EGD with possible dilation with Dr. Carlean Purl.  Rule out stricture vs esophagitis vs EoE.  She will remain on the omeprazole 40 mg daily for now.  The risks, benefits, and alternatives to EGD with dilation were discussed with the patient and she consents to proceed.   CC:  Glendon Axe, MD

## 2020-10-22 NOTE — Patient Instructions (Signed)
If you are age 62 or younger, your body mass index should be between 19-25. Your Body mass index is 43.24 kg/m. If this is out of the aformentioned range listed, please consider follow up with your Primary Care Provider.  __________________________________________________________  The [facility] providers would like to encourage you to use [facility] to communicate with providers for non-urgent requests or questions.  Due to long hold times on the telephone, sending your provider a message by [facility] may be a faster and more efficient way to get a response.  Please allow 48 business hours for a response.  Please remember that this is for non-urgent requests.   You have been scheduled for an endoscopy. Please follow written instructions given to you at your visit today. If you use inhalers (even only as needed), please bring them with you on the day of your procedure.  Thank you for entrusting me with your care and choosing [facility].  Alonza Bogus, PA-C

## 2020-10-23 ENCOUNTER — Encounter: Payer: Self-pay | Admitting: Gastroenterology

## 2020-10-30 ENCOUNTER — Ambulatory Visit (AMBULATORY_SURGERY_CENTER): Payer: 59 | Admitting: Internal Medicine

## 2020-10-30 ENCOUNTER — Other Ambulatory Visit: Payer: Self-pay

## 2020-10-30 ENCOUNTER — Encounter: Payer: Self-pay | Admitting: Internal Medicine

## 2020-10-30 VITALS — BP 125/83 | HR 85 | Temp 96.9°F | Resp 19 | Ht 68.0 in | Wt 284.0 lb

## 2020-10-30 DIAGNOSIS — K219 Gastro-esophageal reflux disease without esophagitis: Secondary | ICD-10-CM

## 2020-10-30 DIAGNOSIS — K222 Esophageal obstruction: Secondary | ICD-10-CM

## 2020-10-30 DIAGNOSIS — R1310 Dysphagia, unspecified: Secondary | ICD-10-CM | POA: Diagnosis not present

## 2020-10-30 DIAGNOSIS — K449 Diaphragmatic hernia without obstruction or gangrene: Secondary | ICD-10-CM | POA: Diagnosis not present

## 2020-10-30 MED ORDER — SODIUM CHLORIDE 0.9 % IV SOLN: 500.0000 mL | Freq: Once | INTRAVENOUS | Status: DC

## 2020-10-30 NOTE — Patient Instructions (Addendum)
I dilated a slight narrowing in the esophagus. You have a hiatal hernia also - please see the handout.  Let us see if this makes you feel better.  If not let me know - we will also want to see what the chest CT tells [name].  I appreciate the opportunity to care for you. Gatha Mayer, MD, [facility]   Discharge instructions given. Handout on a dilatation diet and a hiatal hernia. Resume previous medications.YOU HAD AN ENDOSCOPIC PROCEDURE TODAY AT [facility]:   Refer to the procedure report that was given to you for any specific questions about what was found during the examination.  If the procedure report does not answer your questions, please call your gastroenterologist to clarify.  If you requested that your care partner not be given the details of your procedure findings, then the procedure report has been included in a sealed envelope for you to review at your convenience later.  YOU SHOULD EXPECT: Some feelings of bloating in the abdomen. Passage of more gas than usual.  Walking can help get rid of the air that was put into your GI tract during the procedure and reduce the bloating. If you had a lower endoscopy (such as a colonoscopy or flexible sigmoidoscopy) you may notice spotting of blood in your stool or on the toilet paper. If you underwent a bowel prep for your procedure, you may not have a normal bowel movement for a few days.  Please Note:  You might notice some irritation and congestion in your nose or some drainage.  This is from the oxygen used during your procedure.  There is no need for concern and it should clear up in a day or so.  SYMPTOMS TO REPORT IMMEDIATELY:   Following upper endoscopy (EGD)  Vomiting of blood or coffee ground material  New chest pain or pain under the shoulder blades  Painful or persistently difficult swallowing  New shortness of breath  Fever of 100F or higher  Black, tarry-looking stools  For urgent or emergent issues, a  gastroenterologist can be reached at any hour by calling [phone]. Do not use MyChart messaging for urgent concerns.    DIET:  We do recommend a small meal at first, but then you may proceed to your regular diet.  Drink plenty of fluids but you should avoid alcoholic beverages for 24 hours.  ACTIVITY:  You should plan to take it easy for the rest of today and you should NOT DRIVE or use heavy machinery until tomorrow (because of the sedation medicines used during the test).    FOLLOW UP: Our staff will call the number listed on your records 48-72 hours following your procedure to check on you and address any questions or concerns that you may have regarding the information given to you following your procedure. If we do not reach you, we will leave a message.  We will attempt to reach you two times.  During this call, we will ask if you have developed any symptoms of COVID 19. If you develop any symptoms (ie: fever, flu-like symptoms, shortness of breath, cough etc.) before then, please call [phone].  If you test positive for Covid 19 in the 2 weeks post procedure, please call and report this information to [name].    If any biopsies were taken you will be contacted by phone or by letter within the next 1-3 weeks.  Please call us at [phone] if you have not heard about the  biopsies in 3 weeks.    SIGNATURES/CONFIDENTIALITY: You and/or your care partner have signed paperwork which will be entered into your electronic medical record.  These signatures attest to the fact that that the information above on your After Visit Summary has been reviewed and is understood.  Full responsibility of the confidentiality of this discharge information lies with you and/or your care-partner.

## 2020-10-30 NOTE — Progress Notes (Signed)
[facility]  History reviewed.

## 2020-10-30 NOTE — Progress Notes (Signed)
Called to room to assist during endoscopic procedure.  Patient ID and intended procedure confirmed with present staff. Received instructions for my participation in the procedure from the performing physician.  

## 2020-10-30 NOTE — Progress Notes (Signed)
pt tolerated well. VSS. awake and to recovery. Report given to RN. Bite block left insitu to recovery. 

## 2020-10-30 NOTE — Op Note (Signed)
[facility] Patient Name: Nicole Rasmussen Procedure Date: 10/30/2020 2:56 PM MRN: [id number] Endoscopist: Gatha Mayer , MD Age: 60 Referring MD:  Date of Birth: [birth year] Gender: Female Account #: [id number] Procedure:                Upper GI endoscopy Indications:              Epigastric abdominal pain, Dysphagia, Odynophagia,                            Heartburn Medicines:                Propofol per Anesthesia, Monitored Anesthesia Care Procedure:                Pre-Anesthesia Assessment:                           - Prior to the procedure, a History and Physical                            was performed, and patient medications and                            allergies were reviewed. The patient's tolerance of                            previous anesthesia was also reviewed. The risks                            and benefits of the procedure and the sedation                            options and risks were discussed with the patient.                            All questions were answered, and informed consent                            was obtained. Prior Anticoagulants: The patient has                            taken no previous anticoagulant or antiplatelet                            agents. ASA Grade Assessment: III - A patient with                            severe systemic disease. After reviewing the risks                            and benefits, the patient was deemed in                            satisfactory condition to undergo the procedure.  After obtaining informed consent, the endoscope was                            passed under direct vision. Throughout the                            procedure, the patient's blood pressure, pulse, and                            oxygen saturations were monitored continuously. The                            Endoscope was introduced through the mouth, and                            advanced to  the second part of duodenum. The upper                            GI endoscopy was accomplished without difficulty.                            The patient tolerated the procedure well. Scope In: Scope Out: Findings:                 One benign-appearing, intrinsic moderate stenosis                            was found at the gastroesophageal junction. The                            stenosis was traversed. A TTS dilator was passed                            through the scope. Dilation with an 18-19-20 mm                            balloon dilator was performed to 20 mm. The                            dilation site was examined and showed no change.                            The balloon was also withdrawn retrograde through                            entire esophagus w/o dilation effect. Biopsy                            forceps then used to disrupt the ring-like                            stricture at GE junction  Estimated blood loss: none.                           A 5 cm hiatal hernia was present.                           The gastroesophageal flap valve was visualized                            endoscopically and classified as Hill Grade IV (no                            fold, wide open lumen, hiatal hernia present).                           The exam was otherwise without abnormality.                           The cardia and gastric fundus were normal on                            retroflexion. Complications:            No immediate complications. Estimated Blood Loss:     Estimated blood loss was minimal. Impression:               - Benign-appearing esophageal stenosis.                            Dilated/disrupted.                           - 5 cm hiatal hernia.                           - Gastroesophageal flap valve classified as Hill                            Grade IV (no fold, wide open lumen, hiatal hernia                            present).                            - The examination was otherwise normal.                           - No specimens collected.                           - No clear caiuse of pain seen but stricture would                            cause dysphagia Recommendation:           - Patient has a contact number available for  emergencies. The signs and symptoms of potential                            delayed complications were discussed with the                            patient. Return to normal activities tomorrow.                            Written discharge instructions were provided to the                            patient.                           - Clear liquids x 1 hour then soft foods rest of                            day. Start prior diet tomorrow.                           - Continue present medications.                           - Await response to dilation and disruption of                            ring-like stricture                           Await chest CT she is having 7/7 - cause of sxs not                            clear - wonder about functional heartburn? Gatha Mayer, MD 10/30/2020 3:25:31 PM This report has been signed electronically.

## 2020-11-04 ENCOUNTER — Telehealth: Payer: Self-pay

## 2020-11-04 ENCOUNTER — Other Ambulatory Visit: Payer: Self-pay

## 2020-11-04 ENCOUNTER — Ambulatory Visit
Admission: RE | Admit: 2020-11-04 | Discharge: 2020-11-04 | Disposition: A | Discharge status: Home / Self Care | Payer: 59 | Source: Ambulatory Visit | Attending: Pulmonary Disease | Admitting: Pulmonary Disease

## 2020-11-04 DIAGNOSIS — I272 Pulmonary hypertension, unspecified: Secondary | ICD-10-CM

## 2020-11-04 NOTE — Telephone Encounter (Signed)
  Follow up Call-  Call back number 10/30/2020  Post procedure Call Back phone  # [phone]  Permission to leave phone message Yes  Some recent data might be hidden     Patient questions:  Do you have a fever, pain , or abdominal swelling? No. Pain Score  0 *  Have you tolerated food without any problems? Yes.    Have you been able to return to your normal activities? Yes.    Do you have any questions about your discharge instructions: Diet   No. Medications  No. Follow up visit  No.  Do you have questions or concerns about your Care? No.  Actions: * If pain score is 4 or above: No action needed, pain <4.  Have you developed a fever since your procedure? No   2.   Have you had an respiratory symptoms (SOB or cough) since your procedure? No   3.   Have you tested positive for COVID 19 since your procedure no   4.   Have you had any family members/close contacts diagnosed with the COVID 19 since your procedure?  No   If yes to any of these questions please route to Joylene John, RN and [name] Prince, RN

## 2021-02-26 ENCOUNTER — Other Ambulatory Visit (HOSPITAL_BASED_OUTPATIENT_CLINIC_OR_DEPARTMENT_OTHER): Payer: Self-pay | Admitting: Family Medicine

## 2021-02-26 ENCOUNTER — Other Ambulatory Visit (HOSPITAL_COMMUNITY): Payer: Self-pay | Admitting: Family Medicine

## 2021-02-26 DIAGNOSIS — R1011 Right upper quadrant pain: Secondary | ICD-10-CM

## 2021-03-01 ENCOUNTER — Encounter (HOSPITAL_BASED_OUTPATIENT_CLINIC_OR_DEPARTMENT_OTHER): Payer: Self-pay

## 2021-03-01 ENCOUNTER — Emergency Department (HOSPITAL_BASED_OUTPATIENT_CLINIC_OR_DEPARTMENT_OTHER)
Admission: EM | Admit: 2021-03-01 | Discharge: 2021-03-01 | Disposition: A | Discharge status: Home / Self Care | Payer: 59 | Attending: Emergency Medicine | Admitting: Emergency Medicine

## 2021-03-01 ENCOUNTER — Other Ambulatory Visit: Payer: Self-pay

## 2021-03-01 DIAGNOSIS — Z91040 Latex allergy status: Secondary | ICD-10-CM | POA: Diagnosis not present

## 2021-03-01 DIAGNOSIS — Z7982 Long term (current) use of aspirin: Secondary | ICD-10-CM | POA: Diagnosis not present

## 2021-03-01 DIAGNOSIS — R519 Headache, unspecified: Secondary | ICD-10-CM | POA: Diagnosis present

## 2021-03-01 DIAGNOSIS — Z79899 Other long term (current) drug therapy: Secondary | ICD-10-CM | POA: Diagnosis not present

## 2021-03-01 DIAGNOSIS — Z87891 Personal history of nicotine dependence: Secondary | ICD-10-CM | POA: Insufficient documentation

## 2021-03-01 DIAGNOSIS — G44209 Tension-type headache, unspecified, not intractable: Secondary | ICD-10-CM | POA: Diagnosis not present

## 2021-03-01 DIAGNOSIS — J45909 Unspecified asthma, uncomplicated: Secondary | ICD-10-CM | POA: Diagnosis not present

## 2021-03-01 LAB — CBC WITH DIFFERENTIAL/PLATELET
Abs Immature Granulocytes: 0.03 10*3/uL (ref 0.00–0.07)
Basophils Absolute: 0 10*3/uL (ref 0.0–0.1)
Basophils Relative: 0 %
Eosinophils Absolute: 0.1 10*3/uL (ref 0.0–0.5)
Eosinophils Relative: 2 %
HCT: 44 % (ref 36.0–46.0)
Hemoglobin: 14.1 g/dL (ref 12.0–15.0)
Immature Granulocytes: 0 %
Lymphocytes Relative: 32 %
Lymphs Abs: 2.6 10*3/uL (ref 0.7–4.0)
MCH: 26.8 pg (ref 26.0–34.0)
MCHC: 32 g/dL (ref 30.0–36.0)
MCV: 83.5 fL (ref 80.0–100.0)
Monocytes Absolute: 0.7 10*3/uL (ref 0.1–1.0)
Monocytes Relative: 9 %
Neutro Abs: 4.6 10*3/uL (ref 1.7–7.7)
Neutrophils Relative %: 57 %
Platelets: 185 10*3/uL (ref 150–400)
RBC: 5.27 MIL/uL — ABNORMAL HIGH (ref 3.87–5.11)
RDW: 13.9 % (ref 11.5–15.5)
WBC: 8.2 10*3/uL (ref 4.0–10.5)
nRBC: 0 % (ref 0.0–0.2)

## 2021-03-01 LAB — BASIC METABOLIC PANEL
Anion gap: 9 (ref 5–15)
BUN: 22 mg/dL (ref 8–23)
CO2: 26 mmol/L (ref 22–32)
Calcium: 9.3 mg/dL (ref 8.9–10.3)
Chloride: 104 mmol/L (ref 98–111)
Creatinine, Ser: 0.99 mg/dL (ref 0.44–1.00)
GFR, Estimated: >60 mL/min (ref >60)
Glucose, Bld: 99 mg/dL (ref 70–99)
Potassium: 3.5 mmol/L (ref 3.5–5.1)
Sodium: 139 mmol/L (ref 135–145)

## 2021-03-01 MED ORDER — ACETAMINOPHEN 500 MG PO TABS
1000.0000 mg | ORAL_TABLET | Freq: Once | ORAL | Status: AC
  Administered 2021-03-01: 1000 mg via ORAL
  Filled 2021-03-01: qty 2

## 2021-03-01 MED ORDER — METOCLOPRAMIDE HCL 5 MG/ML IJ SOLN
10.0000 mg | Freq: Once | INTRAMUSCULAR | Status: AC
  Administered 2021-03-01: 10 mg via INTRAVENOUS
  Filled 2021-03-01: qty 2

## 2021-03-01 MED ORDER — DIPHENHYDRAMINE HCL 50 MG/ML IJ SOLN
12.5000 mg | Freq: Once | INTRAMUSCULAR | Status: AC
  Administered 2021-03-01: 12.5 mg via INTRAVENOUS
  Filled 2021-03-01: qty 1

## 2021-03-01 MED ORDER — SODIUM CHLORIDE 0.9 % IV BOLUS
1000.0000 mL | Freq: Once | INTRAVENOUS | Status: AC
  Administered 2021-03-01: 1000 mL via INTRAVENOUS

## 2021-03-01 NOTE — ED Provider Notes (Signed)
[facility] EMERGENCY DEPARTMENT Provider Note   CSN: [id number] Arrival date & time: 03/01/21  1415     History Chief Complaint  Patient presents with   Headache    Nicole Rasmussen is a 60 y.o. female with a past medical history of hyperlipidemia, reflux and hypertension presenting today with a complaint of headache.  Patient reports that last Wednesday she had her annual checkup and this was remarkable for elevated blood pressure.  She was discharged home without medication, however on Thursday morning she checked her blood pressure which was severely elevated and presented to her primary care.  At that time she was prescribed a hypertensive agent that gave her headache.  She presented to her primary care provider on Friday due to this headache that was in her neck, sinuses and forehead.  Primary care provider gave her a "cocktail shot."  On Monday she returned to the primary care provider and reported that the shot made her face and lips swell.  No known medication allergies.  Does not know the name of the medication she received from the primary care provider.  Denies any visual changes, chest pain or shortness of breath.  The headache comes and goes.  No associated nausea or vomiting.  No history of migraines.  Reports she has also been having some sinus pain.  She called her primary care provider yesterday who sent antibiotics to the pharmacy.  She has not yet picked this up.    Past Medical History:  Diagnosis Date   Acid reflux    Allergy    Anemia    Arthritis    Asthma    no inhalers   B12 deficiency    Dysmetabolic syndrome X    Dyspnea    Food allergy    shellfish   GERD (gastroesophageal reflux disease)    HLD (hyperlipidemia)    Joint pain    knees and back   Lower back pain    Nerve pain    top of right thigh   Neuromuscular disorder ([place])    bilateral leg pain from back issue   Prediabetes    Shortness of breath    Sinus problem    Swallowing  difficulty    Vitamin B 12 deficiency    Vitamin D deficiency     Patient Active Problem List   Diagnosis Date Noted   Dysphagia 10/22/2020   Abdominal pain, epigastric 10/22/2020   Gastroesophageal reflux disease 10/22/2020   Osteoarthritis of right knee 03/16/2020   Prediabetes 08/26/2019   Class 3 severe obesity with serious comorbidity and body mass index (BMI) of 40.0 to 44.9 in adult [facility]) 07/11/2019   Recurrent sinusitis 10/19/2018   Other fatigue 02/09/2017   Shortness of breath on exertion 02/09/2017   Hyperglycemia 02/09/2017   Vitamin D deficiency 02/09/2017   Dysesthesia 01/27/2016   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 01/27/2016   Lumbosacral radiculopathy at L5 01/27/2016   Headache 06/08/2013   Atypical chest pain 06/08/2013   HYPERCHOLESTEROLEMIA 09/17/2007   ARTHRITIS 09/17/2007   HEARTBURN 09/17/2007    Past Surgical History:  Procedure Laterality Date   CESAREAN SECTION     1 time   FOOT SURGERY Bilateral 1980's   bunionectomy and hammer toe on both feet   MOUTH SURGERY  2011   had implants/ root canal/bridges   WISDOM TOOTH EXTRACTION     with the implants     OB History     Gravida  4  Para      Term      Preterm      AB  3   Living  1      SAB  3   IAB      Ectopic      Multiple      Live Births              Family History  Problem Relation Age of Onset   Cancer Mother    Hypertension Mother    Hyperlipidemia Mother    Heart disease Mother    Breast cancer Mother    Cancer Father    Kidney disease Father    Prostate cancer Father    Hyperlipidemia Sister    Diabetes Sister    Hypertension Sister    Hypertension Sister    Hypertension Sister    Hypertension Sister    Hyperlipidemia Brother    Hypertension Brother    Prostate cancer Brother    Hypertension Brother    Hypertension Brother    Hypertension Brother    Colon cancer Neg Hx    Esophageal cancer Neg Hx     Pancreatic cancer Neg Hx    Stomach cancer Neg Hx     Social History   Tobacco Use   Smoking status: Former    Packs/day: 1.00    Years: 20.00    Pack years: 20.00    Types: Cigarettes    Quit date: 05/02/1996    Years since quitting: 24.8   Smokeless tobacco: Never  Vaping Use   Vaping Use: Never used  Substance Use Topics   Alcohol use: Yes    Comment: social   Drug use: No    Home Medications Prior to Admission medications   Medication Sig Start Date End Date Taking? Authorizing Provider  albuterol (VENTOLIN HFA) 108 (90 Base) MCG/ACT inhaler Inhale into the lungs every 6 (six) hours as needed for wheezing or shortness of breath.    [provider]  aspirin EC 81 MG tablet Take 81 mg by mouth daily.    [provider]  atorvastatin (LIPITOR) 10 MG tablet Take 10 mg by mouth daily.    [provider]  cyclobenzaprine (FLEXERIL) 10 MG tablet Take 10 mg by mouth 3 (three) times daily as needed for muscle spasms.    [provider]  fluticasone (FLONASE) 50 MCG/ACT nasal spray Place into both nostrils daily.    [provider]  gabapentin (NEURONTIN) 300 MG capsule Take 1 capsule (300 mg total) by mouth 3 (three) times daily. Can take once daily or 2 times a day, up to 3 times a day. 04/18/16   Star Age, MD  Magnesium 250 MG TABS Take 250 mg by mouth daily.    [provider]  meloxicam (MOBIC) 15 MG tablet Take 15 mg by mouth daily. Patient not taking: Reported on 10/30/2020 09/06/20   [provider]  omeprazole (PRILOSEC) 40 MG capsule Take 40 mg by mouth daily. 09/29/20   [provider]    Allergies    Shellfish allergy, Latex, and Sulfonamide derivatives  Review of Systems   Review of Systems  HENT:  Positive for sinus pain. Negative for ear pain.   Eyes:  Negative for photophobia and visual disturbance.  Respiratory:  Negative for shortness of breath.   Cardiovascular:  Negative for chest pain.   Musculoskeletal:  Positive for neck pain.  Neurological:  Positive for headaches. Negative for numbness.  All other  systems reviewed and are negative.  Physical Exam Updated Vital Signs BP (!) 147/101 (BP Location: Left Arm)   Pulse (!) 102   Temp 98.3 F (36.8 C) (Oral)   Resp 16   Ht 5\' 8"  (1.727 m)   Wt 124.7 kg   LMP 10/11/2010   SpO2 96%   BMI 41.81 kg/m   Physical Exam Vitals and nursing note reviewed.  Constitutional:      General: She is not in acute distress.    Appearance: Normal appearance. She is not ill-appearing.  HENT:     Head: Normocephalic and atraumatic.     Mouth/Throat:     Mouth: Mucous membranes are moist.     Pharynx: Oropharynx is clear.  Eyes:     General: No scleral icterus.    Conjunctiva/sclera: Conjunctivae normal.  Cardiovascular:     Rate and Rhythm: Normal rate and regular rhythm.  Pulmonary:     Effort: Pulmonary effort is normal. No respiratory distress.     Breath sounds: No wheezing or rales.  Abdominal:     Palpations: Abdomen is soft.  Musculoskeletal:     Cervical back: Normal range of motion.  Skin:    General: Skin is warm and dry.     Findings: No rash.  Neurological:     Mental Status: She is alert.     Cranial Nerves: No cranial nerve deficit.     Motor: No weakness.  Psychiatric:        Mood and Affect: Mood normal.        Behavior: Behavior normal.    ED Results / Procedures / Treatments   Labs (all labs ordered are listed, but only abnormal results are displayed) Labs Reviewed  CBC WITH DIFFERENTIAL/PLATELET - Abnormal; Notable for the following components:      Result Value   RBC 5.27 (*)    All other components within normal limits  BASIC METABOLIC PANEL    EKG None  Radiology No results found.  Procedures Procedures   Medications Ordered in ED Medications  metoCLOPramide (REGLAN) injection 10 mg (10 mg Intravenous Given 03/01/21 1737)  diphenhydrAMINE (BENADRYL) injection 12.5 mg (12.5 mg  Intravenous Given 03/01/21 1737)  acetaminophen (TYLENOL) tablet 1,000 mg (1,000 mg Oral Given 03/01/21 1736)  sodium chloride 0.9 % bolus 1,000 mL (1,000 mLs Intravenous New Bag/Given 03/01/21 1740)    ED Course  I have reviewed the triage vital signs and the nursing notes.  Pertinent labs & imaging results that were available during my care of the patient were reviewed by me and considered in my medical decision making (see chart for details).    MDM Rules/Calculators/A&P Emergent considerations for headache include subarachnoid hemorrhage, meningitis, temporal arteritis, glaucoma, cerebral ischemia, carotid/vertebral dissection, intracranial tumor, acute or chronic subdural hemorrhage.  All of these were considered throughout the evaluation of this patient.  Patient was evaluated by me, she was in no acute distress.  She had a completely normal neurologic exam.  PERRLA with negative cerebellar testing.  Low suspicion for meningitis.  Imaging not indicated at this time.  She was unable to tell me the medications that she received from her primary care provider however believes the medication ended in "sartan."  I cannot see any of her medications in her chart.  I believe she was likely given a Toradol shot for headache in the office.  Because she appeared to have a reaction to this I will not treat her with Toradol and her migraine cocktail.  Reports taking ibuprofen occasionally for arthritis in her knee and has not had any reactions to this.  I reassessed the patient after migraine cocktail.  She reported feeling better.  Blood work was without abnormalities.  I believe she is stable for discharge at this time.  She is agreeable to following up with her primary care provider about potential blood pressure control.  She is not hypertensive at this time.  Blood pressure may have improved with pain control.  Ambulatory and stable for discharge at this time.  Final Clinical Impression(s) / ED  Diagnoses Final diagnoses:  Acute non intractable tension-type headache    Rx / DC Orders Results and diagnoses were explained to the patient. Return precautions discussed in full. Patient had no additional questions and expressed complete understanding.     Darliss Ridgel 03/01/21 1827    Teressa Lower, MD 03/01/21 [phone]

## 2021-03-01 NOTE — Discharge Instructions (Signed)
Your blood work looks good today.  Your blood pressure came down after we treated your pain.  You should still follow-up with your primary care provider about potential long-term treatment for hypertension if she believes this is necessary.  Please return to the emergency department if you have a sudden onset headache, visual disturbances, become nauseous or are vomiting uncontrollably.  Information about headaches have been attached to these discharge papers.  It was a pleasure to meet you today and I hope that you feel better.

## 2021-03-01 NOTE — ED Triage Notes (Addendum)
Pt arrives ambualtory to ED states she was seen at her dr this week and her BP was high states she was started on a BP medication that gave her bed headaches, she stopped her medication and was reevaluated on Saturday and given a migraine cocktail. Pt states she started feeling some oral swelling and pain in her face, both sides, along jaw. States her lips feel swollen. Speaking in full sentences in triage, NAD. Pt states she had flu shot and booster for Covid on Tuesday before symptoms started

## 2021-03-16 IMAGING — DX CHEST - 2 VIEW
2 series · 2 of 2 positions shown · non-contrast
Comparison: 11/11/2017

CLINICAL DATA: Shortness of breath

EXAM:
CHEST - 2 VIEW

[chest pa]
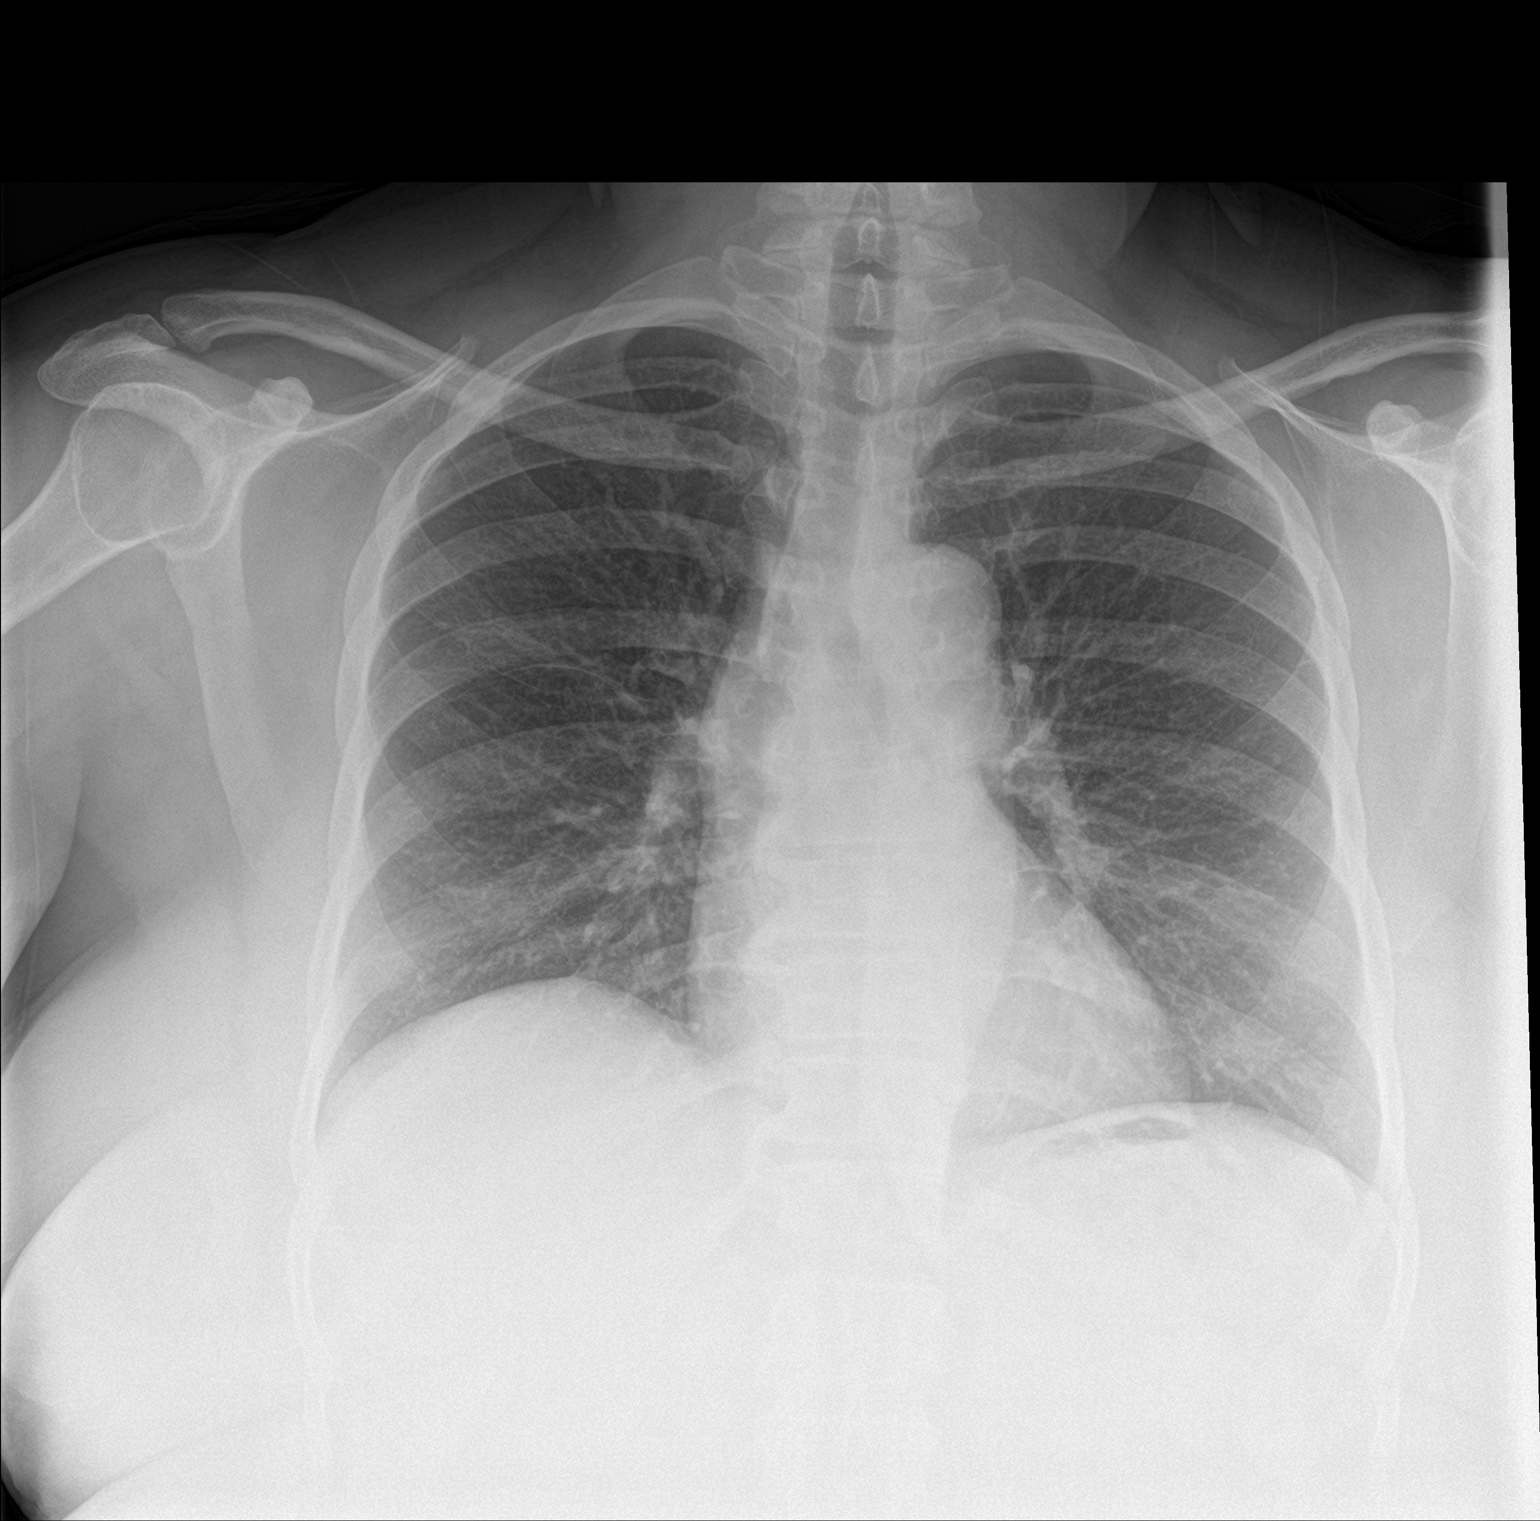

[chest lat]
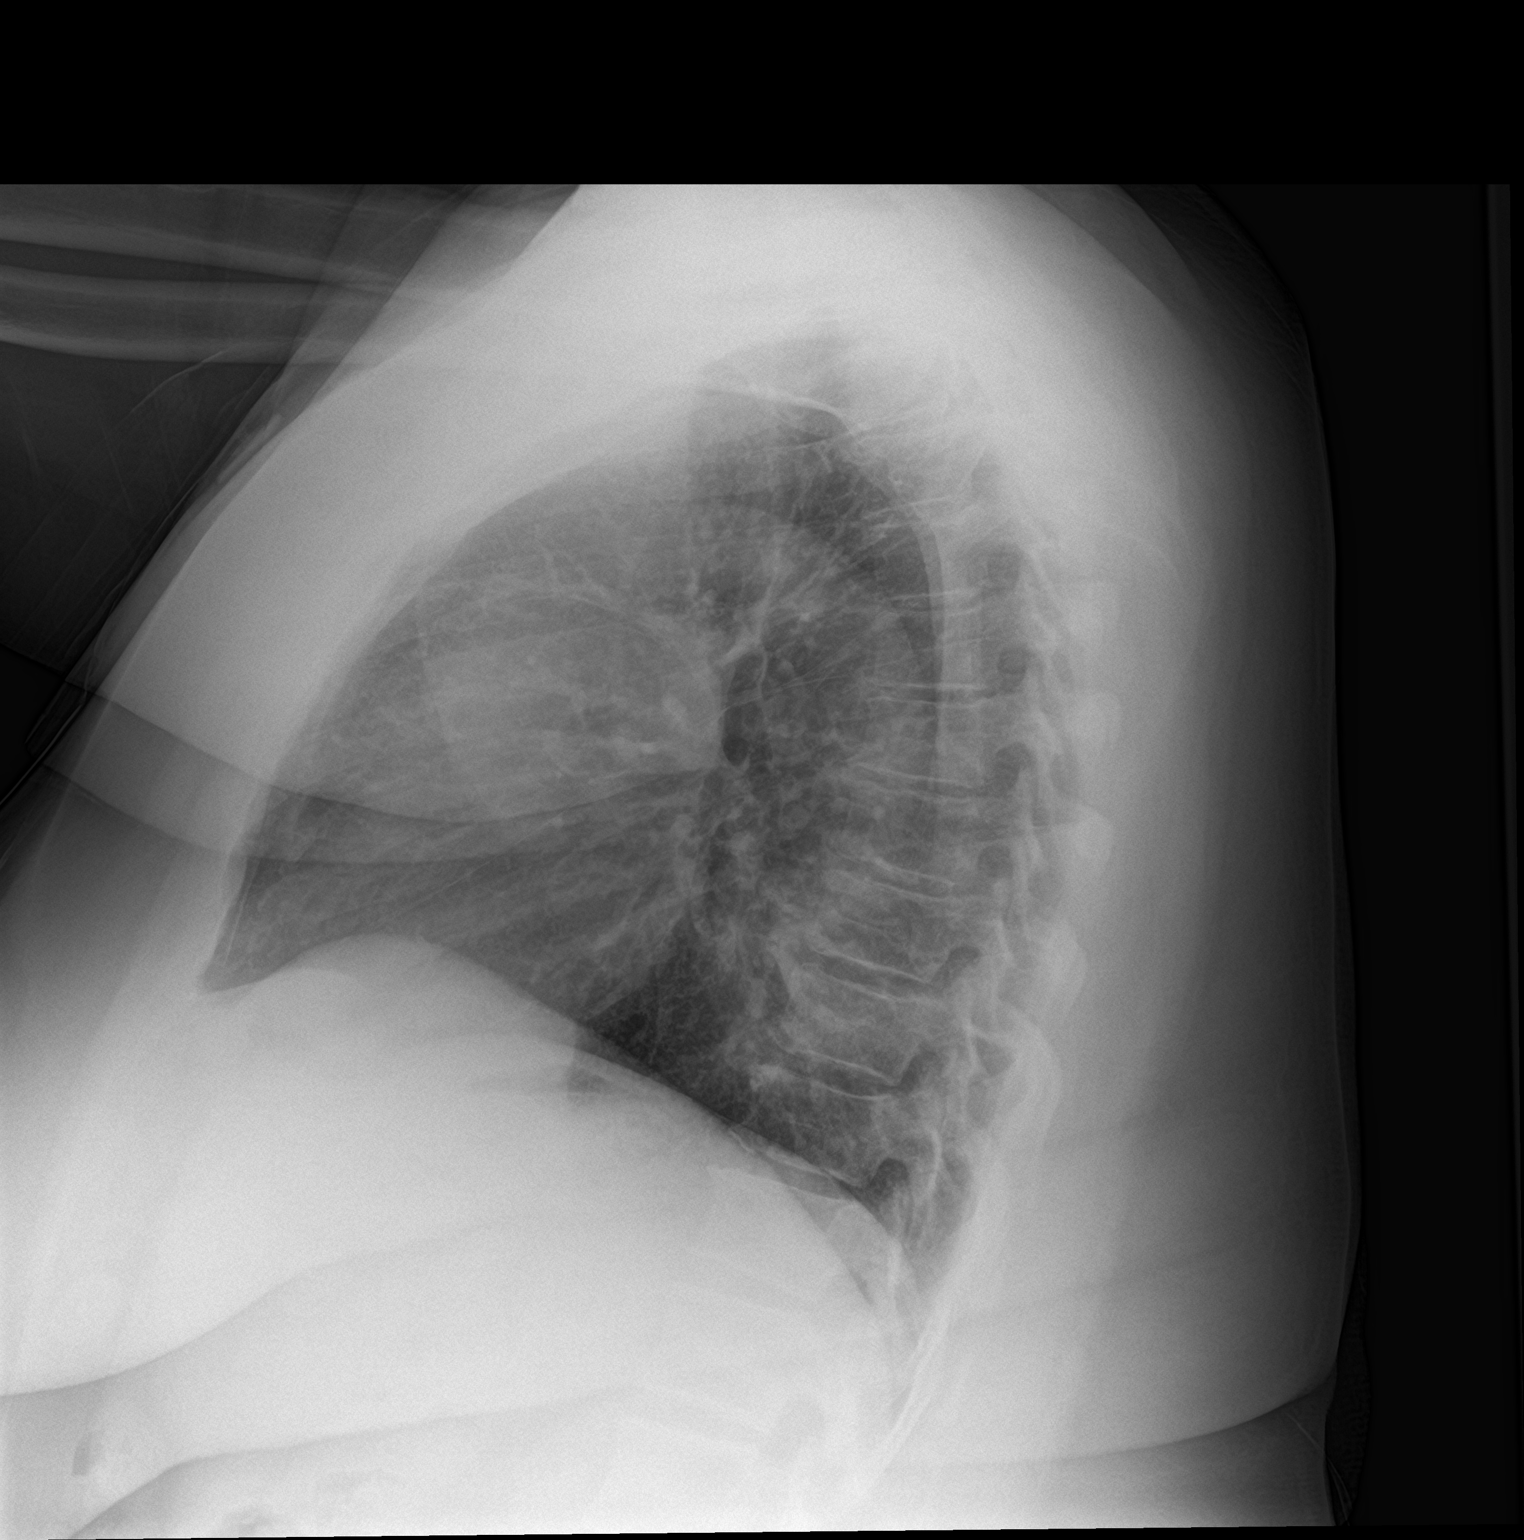

[2 of 2 positions shown; findings below may reference images not displayed]

FINDINGS: The heart size and mediastinal contours are within normal limits.
Both lungs are clear. The visualized skeletal structures are
unremarkable.
IMPRESSION: No active cardiopulmonary disease.

## 2021-03-30 ENCOUNTER — Ambulatory Visit (HOSPITAL_COMMUNITY): Payer: 59

## 2021-04-09 ENCOUNTER — Ambulatory Visit (HOSPITAL_COMMUNITY): Admission: RE | Admit: 2021-04-09 | Payer: 59 | Source: Ambulatory Visit

## 2021-04-12 ENCOUNTER — Encounter (HOSPITAL_COMMUNITY): Payer: 59

## 2021-04-16 ENCOUNTER — Other Ambulatory Visit: Payer: Self-pay

## 2021-04-16 ENCOUNTER — Ambulatory Visit (HOSPITAL_COMMUNITY)
Admission: RE | Admit: 2021-04-16 | Discharge: 2021-04-16 | Disposition: A | Discharge status: Home / Self Care | Payer: 59 | Source: Ambulatory Visit | Attending: Family Medicine | Admitting: Family Medicine

## 2021-04-16 DIAGNOSIS — R1011 Right upper quadrant pain: Secondary | ICD-10-CM | POA: Diagnosis not present

## 2021-04-16 MED ORDER — TECHNETIUM TC 99M MEBROFENIN IV KIT
5.2000 | PACK | Freq: Once | INTRAVENOUS | Status: AC | PRN
  Administered 2021-04-16: 5.2 via INTRAVENOUS

## 2021-05-20 ENCOUNTER — Encounter: Payer: Self-pay | Admitting: Nurse Practitioner

## 2021-05-20 ENCOUNTER — Ambulatory Visit: Payer: 59 | Admitting: Nurse Practitioner

## 2021-05-20 VITALS — BP 128/90 | HR 100 | Ht 67.0 in | Wt 276.2 lb

## 2021-05-20 DIAGNOSIS — R0789 Other chest pain: Secondary | ICD-10-CM | POA: Diagnosis not present

## 2021-05-20 DIAGNOSIS — R1011 Right upper quadrant pain: Secondary | ICD-10-CM | POA: Diagnosis not present

## 2021-05-20 DIAGNOSIS — R1013 Epigastric pain: Secondary | ICD-10-CM

## 2021-05-20 MED ORDER — DICYCLOMINE HCL 10 MG PO CAPS: 10.0000 mg | ORAL_CAPSULE | Freq: Three times a day (TID) | ORAL | 1 refills | Status: DC

## 2021-05-20 NOTE — Progress Notes (Signed)
ASSESSMENT AND PLAN     # 61 yo female with persistent epigastric / chest discomfort / solid food dysphagia. EGD with small bowel biopsiesin 2020 for same symptoms showed a 5 cm HH and benign esophageal stenosis , neither felt to be source of pain. Chest CT scan unrevealing except for lung nodule ( followed by Pulmonary). She saw Cardiology in April and stress test ordered ( ? Results). Chest discomfort hasn't responded to acid blockers and she says it doesn't feel like heartburn.  Functional chest pain?  Esophageal spasms? Not completely sure that her chronic dysphagia is related to the chest pain.   --Consider 24 hr pH / esophageal manometry. First would like to try Bentyl 10 mg prn pain.  --Repeat EGD probably low yield right now --Will bring her back to see Nicole Rasmussen next month. --In the interim she can stop Pepcid.  It was given to her by orthopedics to take along with ibuprofen.  She did not realize this and though no longer taking ibuprofen she has continued to take famotidine.  In the setting of ongoing upper GI symptoms there doesn't seem to be much value in continuing the medication.  # Six month history of RUQ pain radiating around to right back. Unremarkable RUQ [name] and gallbladder emptying study. Etiology of pain unclear.  Musculoskeletal?   # Incidental findings of irregular liver margin on chest CT in July 2022 raising concern for cirrhosis. Of note, RUQ the month prior without liver abnormalities.    HISTORY OF PRESENT ILLNESS    Chief Complaint : still with some swallowing problems, pain in chest and upper stomach, pain in RUQ.   Nicole Rasmussen is a 61 y.o. female known to Nicole Rasmussen with a past medical history of erosive duodenitis, hiatal hernia, chronic abdominal pain, hyperlipidemia, hypertension.  Additional medical history as listed in [place].   Patient was last seen June 2022 for swallowing problems, epigastric and chest pain.  She was actually seen for similar  symptoms in December 2020.  An EGD was scheduled but subsequently canceled due to URI symptoms .  Following her June 2022 visit she subsequently underwent an EGD which showed a 5 cm hiatal and a benign-appearing esophageal stenosis that was dilated.  We have not seen her since  Interval history:  The epigastric and chest discomfort did for what ever reason get better following the EGD but she never really had much improvement in the solid food dysphagia. Now with recurrent epigastric and chest pain for about 3-4 months.  The pain radiates from the epigastrium up into her chest and sometimes into her throat .  It can also radiate through to her back. The pain is sometimes pressure like, other times a burning sensation but doesn't feel like heartburn. The discomfort is not related to eating or drinking but are random and not related to exertion. She may go weeks without an episode but then may have symptoms a couple of times a week. Episodes can last a few hours. Sometimes GingerAle can induce burping which helps. She has been evaluated by Nicole Rasmussen with Cardiology ( April 2022).   Plan was for an exercise stress test  ( ? Results).   She is followed by pulmonary for a stable pulmonary nodule which is under surveillance.    Nicole Rasmussen was recommended by Nicole Rasmussen to take Pepcid and ibuprofen for knee pain about 6 months ago.  Stopped Ibuprofen about 2-3 months ago and started Tylenol. She has continued Pepcid (  thought it was for knee pain). Omeprazole on home med, she doesn't take it. Mobic on home med list, she hasn't had it in 6 months.  She does take a daily baby ASA.   Trulicity on home med list. She was prescribed it a few months back but has only taken one dose. Didn't take any additional doses as she was concerned about her GI problems    New Problem:  Having RUQ over last 6 months. Sharp, piercing pain radiating around to right back / right shoulder blade. Occurs 2-3 times a week and can last for hours. Pain  is not usually related to eating. Sometimes  has to "hold" RUQ with hands when moving around so as not to exacerbate the pain. She has no associated N/V. Episodes  She has a daily BM, denies constipation.   Occasionally she still has problems solid foods.    Hepatic Function Latest Ref Rng & Units 10/01/2020 08/20/2019 02/09/2017  Total Protein 6.5 - 8.1 g/dL 7.1 - 7.0  Albumin 3.5 - 5.0 g/dL 3.8 4.2 4.3  AST 15 - 41 U/L 19 27 17   ALT 0 - 44 U/L 19 38(A) 24  Alk Phosphatase 38 - 126 U/L 71 59 85  Total Bilirubin 0.3 - 1.2 mg/dL 0.9 - 0.4    CBC Latest Ref Rng & Units 03/01/2021 10/01/2020 04/25/2019  WBC 4.0 - 10.5 K/uL 8.2 7.7 8.8  Hemoglobin 12.0 - 15.0 g/dL 14.1 13.3 13.2  Hematocrit 36.0 - 46.0 % 44.0 42.3 41.1  Platelets 150 - 400 K/uL 185 PLATELET CLUMPS NOTED ON SMEAR, UNABLE TO ESTIMATE 173    Lab Results  Component Value Date   LIPASE 26 10/01/2020    PREVIOUS ENDOSCOPIC EVALUATIONS / PERTINENT STUDIES:     2019 colonoscopy  --One 5 mm polyp in the cecum, removed with a cold snare. Resected and retrieved. - Diverticulosis in the sigmoid colon. - The examination was otherwise normal on direct and retroflexion views.  Path - not a polyp. Benign mucosa  July 2022 EGD --Benign-appearing esophageal stenosis. Dilated/disrupted. - 5 cm hiatal hernia. - Gastroesophageal flap valve classified as Hill Grade IV (no fold, wide open lumen, hiatal hernia present). - The examination was otherwise normal. - No specimens collected. - No clear caiuse of pain seen but stricture would cause dysphagia  Dec 2022 RUQ US --unremarkable   December 2022 gallbladder empty scan  --unremarkable.   Current Medications, Allergies, Past Medical History, Past Surgical History, Family History and Social History were reviewed in [employer] record.     Current Outpatient Medications  Medication Sig Dispense Refill   albuterol (VENTOLIN HFA) 108 (90 Base) MCG/ACT  inhaler Inhale into the lungs every 6 (six) hours as needed for wheezing or shortness of breath.     aspirin EC 81 MG tablet Take 81 mg by mouth daily.     atorvastatin (LIPITOR) 10 MG tablet Take 10 mg by mouth daily.     cyclobenzaprine (FLEXERIL) 10 MG tablet Take 10 mg by mouth 3 (three) times daily as needed for muscle spasms.     famotidine (PEPCID) 40 MG tablet Take 40 mg by mouth daily.     fluticasone (FLONASE) 50 MCG/ACT nasal spray Place into both nostrils daily.     gabapentin (NEURONTIN) 300 MG capsule Take 1 capsule (300 mg total) by mouth 3 (three) times daily. Can take once daily or 2 times a day, up to 3 times a day. 90 capsule 5  gabapentin (NEURONTIN) 400 MG capsule Take 1 capsule by mouth 3 (three) times daily as needed.     Magnesium 250 MG TABS Take 250 mg by mouth daily.     omeprazole (PRILOSEC) 40 MG capsule Take 40 mg by mouth daily.     meloxicam (MOBIC) 15 MG tablet Take 15 mg by mouth daily. (Patient not taking: Reported on 05/05/3886)     TRULICITY 7.57 VJ/2.8AS SOPN Inject 0.75 mg into the skin once a week. (Patient not taking: Reported on 05/20/2021)     No current facility-administered medications for this visit.    Review of Systems: No chest pain. No shortness of breath. No urinary complaints.   PHYSICAL EXAM :    Wt Readings from Last 3 Encounters:  05/20/21 276 lb 4 oz (125.3 kg)  03/01/21 275 lb (124.7 kg)  10/30/20 284 lb (128.8 kg)    BP 128/90 (BP Location: Left Arm, Patient Position: Sitting, Cuff Size: Large)    Pulse 100    Ht 5' 7"  (1.702 m) Comment: height measured without shoes   Wt 276 lb 4 oz (125.3 kg)    LMP 10/11/2010    BMI 43.27 kg/m  Constitutional:  Generally well appearing female in no acute distress. Psychiatric: Pleasant. Normal mood and affect. Behavior is normal. EENT: Pupils normal.  Conjunctivae are normal. No scleral icterus. Neck supple.  Cardiovascular: Normal rate, regular rhythm. No edema Pulmonary/chest: Effort  normal and breath sounds normal. No wheezing, rales or rhonchi. Abdominal: Soft, nondistended, nontender. Bowel sounds active throughout. There are no masses palpable. No hepatomegaly. Neurological: Alert and oriented to person place and time. Skin: Skin is warm and dry. No rashes noted.  Tye Savoy, NP  05/20/2021, 2:57 PM

## 2021-05-20 NOTE — Patient Instructions (Signed)
We have sent the following medications to your pharmacy for you to pick up at your convenience: Dicyclomine 10 mg three times daily before meals.   Stop Pepcid.  If you are age 63 or older, your body mass index should be between 23-30. Your Body mass index is 43.27 kg/m. If this is out of the aforementioned range listed, please consider follow up with your Primary Care Provider.  If you are age 39 or younger, your body mass index should be between 19-25. Your Body mass index is 43.27 kg/m. If this is out of the aformentioned range listed, please consider follow up with your Primary Care Provider.   ________________________________________________________  The [facility] providers would like to encourage you to use [facility] to communicate with providers for non-urgent requests or questions.  Due to long hold times on the telephone, sending your provider a message by [facility] may be a faster and more efficient way to get a response.  Please allow 48 business hours for a response.  Please remember that this is for non-urgent requests.  _______________________________________________________

## 2021-05-26 ENCOUNTER — Other Ambulatory Visit: Payer: Self-pay

## 2021-05-26 ENCOUNTER — Telehealth: Payer: Self-pay

## 2021-05-26 DIAGNOSIS — R1013 Epigastric pain: Secondary | ICD-10-CM

## 2021-05-26 DIAGNOSIS — R0789 Other chest pain: Secondary | ICD-10-CM

## 2021-05-26 DIAGNOSIS — K219 Gastro-esophageal reflux disease without esophagitis: Secondary | ICD-10-CM

## 2021-05-26 NOTE — Telephone Encounter (Signed)
-----   Message from Willia Craze, NP sent at 05/26/2021  1:06 PM EST ----- Eustaquio Maize,  I had told patient that Dr. Carlean Purl may want a test done prior to her follow up with him. Please schedule her for an Upper GI series ( small bowel follow through is not needed). You can tell her that this will help further evaluate her Hiatal hernia and pain. Thanks

## 2021-05-26 NOTE — Telephone Encounter (Signed)
Spoke with the patient. She agrees to this plan of care. She will go to [facility] 06/03/21 arriving at 9:00 am to Admitting for a 9:30 am appointment. She will be NPO p MN for the imaging test.

## 2021-06-03 ENCOUNTER — Other Ambulatory Visit: Payer: Self-pay

## 2021-06-03 ENCOUNTER — Ambulatory Visit (HOSPITAL_COMMUNITY)
Admission: RE | Admit: 2021-06-03 | Discharge: 2021-06-03 | Disposition: A | Discharge status: Home / Self Care | Payer: 59 | Source: Ambulatory Visit | Attending: Nurse Practitioner | Admitting: Nurse Practitioner

## 2021-06-03 DIAGNOSIS — R1013 Epigastric pain: Secondary | ICD-10-CM | POA: Insufficient documentation

## 2021-06-03 DIAGNOSIS — K219 Gastro-esophageal reflux disease without esophagitis: Secondary | ICD-10-CM | POA: Diagnosis present

## 2021-06-03 DIAGNOSIS — R0789 Other chest pain: Secondary | ICD-10-CM | POA: Diagnosis present

## 2021-06-16 ENCOUNTER — Other Ambulatory Visit (INDEPENDENT_AMBULATORY_CARE_PROVIDER_SITE_OTHER): Payer: 59

## 2021-06-16 ENCOUNTER — Ambulatory Visit: Payer: 59 | Admitting: Internal Medicine

## 2021-06-16 ENCOUNTER — Encounter: Payer: Self-pay | Admitting: Internal Medicine

## 2021-06-16 VITALS — BP 142/84 | HR 101 | Ht 68.0 in | Wt 278.0 lb

## 2021-06-16 DIAGNOSIS — T148XXA Other injury of unspecified body region, initial encounter: Secondary | ICD-10-CM

## 2021-06-16 DIAGNOSIS — R1011 Right upper quadrant pain: Secondary | ICD-10-CM

## 2021-06-16 DIAGNOSIS — R0789 Other chest pain: Secondary | ICD-10-CM | POA: Diagnosis not present

## 2021-06-16 DIAGNOSIS — G8929 Other chronic pain: Secondary | ICD-10-CM | POA: Diagnosis not present

## 2021-06-16 LAB — CBC WITH DIFFERENTIAL/PLATELET
Basophils Absolute: 0 10*3/uL (ref 0.0–0.1)
Basophils Relative: 0.6 % (ref 0.0–3.0)
Eosinophils Absolute: 0.2 10*3/uL (ref 0.0–0.7)
Eosinophils Relative: 2.7 % (ref 0.0–5.0)
HCT: 39 % (ref 36.0–46.0)
Hemoglobin: 12.6 g/dL (ref 12.0–15.0)
Lymphocytes Relative: 37 % (ref 12.0–46.0)
Lymphs Abs: 2.4 10*3/uL (ref 0.7–4.0)
MCHC: 32.3 g/dL (ref 30.0–36.0)
MCV: 83.3 fl (ref 78.0–100.0)
Monocytes Absolute: 0.6 10*3/uL (ref 0.1–1.0)
Monocytes Relative: 8.8 % (ref 3.0–12.0)
Neutro Abs: 3.3 10*3/uL (ref 1.4–7.7)
Neutrophils Relative %: 50.9 % (ref 43.0–77.0)
Platelets: 160 10*3/uL (ref 150.0–400.0)
RBC: 4.69 Mil/uL (ref 3.87–5.11)
RDW: 14.6 % (ref 11.5–15.5)
WBC: 6.6 10*3/uL (ref 4.0–10.5)

## 2021-06-16 LAB — PROTIME-INR
INR: 1.1 ratio — ABNORMAL HIGH (ref 0.8–1.0)
Prothrombin Time: 12.4 s (ref 9.6–13.1)

## 2021-06-16 MED ORDER — DICYCLOMINE HCL 20 MG PO TABS: 20.0000 mg | ORAL_TABLET | Freq: Three times a day (TID) | ORAL | 5 refills | Status: DC

## 2021-06-16 NOTE — Patient Instructions (Signed)
Your provider has requested that you go to the basement level for lab work before leaving today. Press "B" on the elevator. The lab is located at the first door on the left as you exit the elevator.  Due to recent changes in healthcare laws, you may see the results of your imaging and laboratory studies on MyChart before your provider has had a chance to review them.  We understand that in some cases there may be results that are confusing or concerning to you. Not all laboratory results come back in the same time frame and the provider may be waiting for multiple results in order to interpret others.  Please give [name] 48 hours in order for your provider to thoroughly review all the results before contacting the office for clarification of your results.   We have sent the following medications to your pharmacy for you to pick up at your convenience: Generic Bentyl 20mg   I appreciate the opportunity to care for you. Silvano Rusk, MD, [facility]

## 2021-06-16 NOTE — Progress Notes (Signed)
Nicole Rasmussen 61 y.o. 07-20-1960 [id number]  Assessment & Plan:   Encounter Diagnoses  Name Primary?   Chronic RUQ pain Yes   Atypical chest pain    Bruising     Cause not clear.  Imaging did include CT scanning ultrasound HIDA scan even EGD and upper GI do not really reveal a cause.  She is responding some to dicyclomine so I will increase the dose from 10-20 3 times daily.  Consider tricyclic agent depending upon clinical course I will see her back in about 2 months.  Seems like some sort of functional GI pain.  Additional testing possibilities do include esophageal manometry but her atypical chest pain seems less prominent at this point.   Platelets were normal back in the fall I will go ahead and recheck a CBC and coags, I did this and her platelets and INR are normal. CC: Glendon Axe, MD   Subjective:   Chief Complaint: Right upper quadrant pain  HPI 61 year old African-American woman with a chronic recurrent pain in the chest and upper abdomen.  She had dysphagia and was evaluated with EGD in July 2022 and had a dilation but continues to have some slight intermittent dysphagia.  She has been on and off PPI therapy and there is not been any benefit so she does not remain on it.  She describes a chronic recurrent severe intense right upper quadrant and sometimes epigastric and lower chest pain.  It will radiate into the back in the infrascapular area.  She has been to the ER she seen other physicians she saw Tye Savoy, NP in January.  Work-up including HIDA scan ultrasound CT scanning has been unrevealing.  Upper GI series showed a small sliding hiatal hernia I had seen a 5 cm hiatal hernia on EGD.  Was a possible stricture that was dilated to 20 mm is noted and then I disrupted it with forceps.  There was some reflux.  No mention of dysmotility.  She kept a diary and she had a few episodes in the interim though overall she feels better on the dicyclomine.  She has  had some postprandial episodes lasting for an hour or so.  No consistent food intake problem.  The pain started to intensify about an hour or more after a meal.  3 or 4 episodes since she was seen in January.  She does have low back issues and pain radiating into the legs and is on Neurontin and Flexeril with limited benefit.  She also mentions that she feels like she is bruising more.  No specific trauma etc. but she wonders if this might be related.  She is understanding of the lack of discrete diagnosis explaining her pain syndrome but "I still think there is something wrong". Allergies  Allergen Reactions   Shellfish Allergy Hives and Swelling   Latex Hives    hives   Sulfonamide Derivatives Other (See Comments)    Rash, itching   Current Meds  Medication Sig   albuterol (VENTOLIN HFA) 108 (90 Base) MCG/ACT inhaler Inhale into the lungs every 6 (six) hours as needed for wheezing or shortness of breath.   aspirin EC 81 MG tablet Take 81 mg by mouth daily.   atorvastatin (LIPITOR) 10 MG tablet Take 10 mg by mouth daily.   cyclobenzaprine (FLEXERIL) 10 MG tablet Take 10 mg by mouth 3 (three) times daily as needed for muscle spasms.   dicyclomine (BENTYL) 10 MG capsule Take 1 capsule (10 mg total) by mouth  3 (three) times daily before meals.   fluticasone (FLONASE) 50 MCG/ACT nasal spray Place into both nostrils daily.   gabapentin (NEURONTIN) 300 MG capsule Take 1 capsule (300 mg total) by mouth 3 (three) times daily. Can take once daily or 2 times a day, up to 3 times a day.   gabapentin (NEURONTIN) 400 MG capsule Take 1 capsule by mouth 3 (three) times daily as needed.   Magnesium 250 MG TABS Take 250 mg by mouth daily.   Past Medical History:  Diagnosis Date   Acid reflux    Allergy    Anemia    Arthritis    Asthma    no inhalers   B12 deficiency    Dysmetabolic syndrome X    Dyspnea    Food allergy    shellfish   GERD (gastroesophageal reflux disease)    HLD  (hyperlipidemia)    Joint pain    knees and back   Lower back pain    Nerve pain    top of right thigh   Neuromuscular disorder (HCC)    bilateral leg pain from back issue   Prediabetes    Shortness of breath    Sinus problem    Swallowing difficulty    Vitamin B 12 deficiency    Vitamin D deficiency    Past Surgical History:  Procedure Laterality Date   CESAREAN SECTION     1 time   FOOT SURGERY Bilateral 1980's   bunionectomy and hammer toe on both feet   MOUTH SURGERY  2011   had implants/ root canal/bridges   WISDOM TOOTH EXTRACTION     with the implants   Social History   Social History Narrative   Occasional caffeine use    family history includes Breast cancer in her mother; Cancer in her father and mother; Diabetes in her sister; Heart disease in her mother; Hyperlipidemia in her brother, mother, and sister; Hypertension in her brother, brother, brother, brother, mother, sister, sister, sister, and sister; Kidney disease in her father; Prostate cancer in her brother and father.   Review of Systems As above  Objective:   Physical Exam BP (!) 142/84    Pulse (!) 101    Ht 5\' 8"  (1.727 m)    Wt 278 lb (126.1 kg)    LMP 10/11/2010    SpO2 96%    BMI 42.27 kg/m  Obese middle-aged black woman in no acute distress There is no pain in the shoulder with range of motion or palpation she has no chest wall or right upper quadrant or abdominal tenderness Exam ok Nontender  Few scattered bruises forearms

## 2021-06-17 ENCOUNTER — Encounter: Payer: Self-pay | Admitting: Internal Medicine

## 2021-08-24 ENCOUNTER — Ambulatory Visit: Payer: 59 | Admitting: Internal Medicine

## 2021-09-17 ENCOUNTER — Encounter: Payer: Self-pay | Admitting: Internal Medicine

## 2021-09-17 ENCOUNTER — Ambulatory Visit: Payer: 59 | Admitting: Internal Medicine

## 2021-09-17 VITALS — BP 122/70 | HR 92 | Ht 68.0 in | Wt 284.0 lb

## 2021-09-17 DIAGNOSIS — R0789 Other chest pain: Secondary | ICD-10-CM | POA: Diagnosis not present

## 2021-09-17 DIAGNOSIS — M792 Neuralgia and neuritis, unspecified: Secondary | ICD-10-CM | POA: Diagnosis not present

## 2021-09-17 DIAGNOSIS — K6289 Other specified diseases of anus and rectum: Secondary | ICD-10-CM | POA: Diagnosis not present

## 2021-09-17 DIAGNOSIS — R1011 Right upper quadrant pain: Secondary | ICD-10-CM

## 2021-09-17 DIAGNOSIS — G8929 Other chronic pain: Secondary | ICD-10-CM

## 2021-09-17 NOTE — Patient Instructions (Signed)
We have placed a referral to [facility].  Due to recent changes in healthcare laws, you may see the results of your imaging and laboratory studies on MyChart before your provider has had a chance to review them.  We understand that in some cases there may be results that are confusing or concerning to you. Not all laboratory results come back in the same time frame and the provider may be waiting for multiple results in order to interpret others.  Please give [name] 48 hours in order for your provider to thoroughly review all the results before contacting the office for clarification of your results.    I appreciate the opportunity to care for you. Silvano Rusk, MD, [facility]

## 2021-09-17 NOTE — Progress Notes (Signed)
Nicole Rasmussen 61 y.o. 22-Jan-1961 [id number]  Assessment & Plan:   Encounter Diagnoses  Name Primary?   Chronic RUQ pain Yes   Atypical chest pain    Anal pain    Neuropathic pain - multiple sites      I explained that at this point I can tell her what is not causing her symptoms but they do sound like they might be neuropathic in origin.  She is already on gabapentin for neurologic issues that include back problems.  She has seen 1 neurologist and would like another opinion.  I told her we could not promise that [facility] neuro would see her but we would resubmit for a second opinion about her neuropathic pain issues.  An autoimmune process might be possible as well.   She is not inclined to take amitriptyline at this time though would consider it in the future.  Follow-up GI as needed at this point.  I am afraid I do not have anything else to offer her based upon what I know at this time.   CC: Glendon Axe, MD   Subjective:   Chief Complaint: Right upper quadrant pain plus other complaints  HPI 61 year old African-American woman with a history of chronic recurrent severe right upper quadrant pain with EGD and HIDA scan and ultrasound and other imaging not revealing a cause.  She was seen in February and thought dicyclomine was helping some.  She is back for reassessment and essentially continues to have this problem and a host of other complaints "all over my body".  She is having pressure in her eyes and had an eye exam that was normal.  Intermittent anal pressure arm pains and swelling in the right leg area.  She also has sciatica symptoms in the left.  She had a neurology evaluation at [facility] neurology last year was dissatisfied with that and wanted to be seen in [facility] neurology.  There is a referral placed for that which was about meralgia paresthetica and [facility] neurology indicated that she had already seen somebody for that and should continue with [facility]  neurology.  Because of these other complaints she is interested in another opinion.  She said nobody ever informed her of that complaint.  She is not inclined to have an empiric cholecystectomy and I would agree.  She has no change in bowel habits or bleeding.  The anal pain is a pressure that can be strong to moderate but short-lived.  It comes and goes and there can be days or weeks without it.  There is no trigger noted.  It does not disturb her sleep.  I think she has paresthesias in her body as well at times. Allergies  Allergen Reactions   Shellfish Allergy Hives and Swelling   Latex Hives    hives   Sulfonamide Derivatives Other (See Comments)    Rash, itching   Current Meds  Medication Sig   albuterol (VENTOLIN HFA) 108 (90 Base) MCG/ACT inhaler Inhale into the lungs every 6 (six) hours as needed for wheezing or shortness of breath.   aspirin EC 81 MG tablet Take 81 mg by mouth daily.   atorvastatin (LIPITOR) 10 MG tablet Take 10 mg by mouth daily.   cyclobenzaprine (FLEXERIL) 10 MG tablet Take 10 mg by mouth 3 (three) times daily as needed for muscle spasms.   dicyclomine (BENTYL) 20 MG tablet Take 1 tablet (20 mg total) by mouth 3 (three) times daily before meals.   fluticasone (FLONASE) 50 MCG/ACT nasal  spray Place into both nostrils daily.   gabapentin (NEURONTIN) 400 MG capsule Take 1 capsule by mouth 3 (three) times daily as needed.   meloxicam (MOBIC) 15 MG tablet Take 15 mg by mouth daily.   Past Medical History:  Diagnosis Date   Acid reflux    Allergy    Anemia    Arthritis    Asthma    no inhalers   B12 deficiency    Dysmetabolic syndrome X    Dyspnea    Food allergy    shellfish   GERD (gastroesophageal reflux disease)    HLD (hyperlipidemia)    Joint pain    knees and back   Lower back pain    Nerve pain    top of right thigh   Neuromuscular disorder (HCC)    bilateral leg pain from back issue   Prediabetes    Shortness of breath    Sinus problem     Swallowing difficulty    Vitamin B 12 deficiency    Vitamin D deficiency    Past Surgical History:  Procedure Laterality Date   CESAREAN SECTION     1 time   COLONOSCOPY     ESOPHAGOGASTRODUODENOSCOPY     FOOT SURGERY Bilateral 1980's   bunionectomy and hammer toe on both feet   MOUTH SURGERY  2011   had implants/ root canal/bridges   WISDOM TOOTH EXTRACTION     with the implants   Social History   Social History Narrative   Occasional caffeine use    family history includes Breast cancer in her mother; Cancer in her father and mother; Diabetes in her sister; Heart disease in her mother; Hyperlipidemia in her brother, mother, and sister; Hypertension in her brother, brother, brother, brother, mother, sister, sister, sister, and sister; Kidney disease in her father; Prostate cancer in her brother and father.   Review of Systems As above  Objective:   Physical Exam BP 122/70   Pulse 92   Ht '5\' 8"'$  (1.727 m)   Wt 284 lb (128.8 kg)   LMP 10/11/2010   BMI 43.18 kg/m  Obese bw NAD  Patti Martinique, CMA present. Abd soft NT BS +  Rectal exam with normal anoderm, anal wink was absent normal resting tone no mass minimal stool present.  Nontender.

## 2021-09-23 ENCOUNTER — Encounter: Payer: Self-pay | Admitting: Neurology

## 2021-10-05 ENCOUNTER — Telehealth: Payer: Self-pay | Admitting: Allergy and Immunology

## 2021-10-05 ENCOUNTER — Encounter: Payer: Self-pay | Admitting: Allergy and Immunology

## 2021-10-05 ENCOUNTER — Ambulatory Visit: Payer: 59 | Admitting: Allergy and Immunology

## 2021-10-05 VITALS — BP 164/96 | HR 95 | Temp 96.5°F | Resp 20 | Ht 68.0 in | Wt 272.6 lb

## 2021-10-05 DIAGNOSIS — J3089 Other allergic rhinitis: Secondary | ICD-10-CM

## 2021-10-05 DIAGNOSIS — T7800XA Anaphylactic reaction due to unspecified food, initial encounter: Secondary | ICD-10-CM

## 2021-10-05 DIAGNOSIS — J4520 Mild intermittent asthma, uncomplicated: Secondary | ICD-10-CM

## 2021-10-05 DIAGNOSIS — J069 Acute upper respiratory infection, unspecified: Secondary | ICD-10-CM

## 2021-10-05 MED ORDER — EPINEPHRINE 0.3 MG/0.3ML IJ SOAJ: 0.3000 mg | INTRAMUSCULAR | 1 refills | Status: DC | PRN

## 2021-10-05 MED ORDER — ALBUTEROL SULFATE HFA 108 (90 BASE) MCG/ACT IN AERS: 2.0000 | INHALATION_SPRAY | RESPIRATORY_TRACT | 1 refills | Status: DC | PRN

## 2021-10-05 MED ORDER — LORATADINE 10 MG PO TABS: 10.0000 mg | ORAL_TABLET | Freq: Two times a day (BID) | ORAL | 5 refills | Status: DC

## 2021-10-05 MED ORDER — RYALTRIS 665-25 MCG/ACT NA SUSP: 2.0000 | Freq: Two times a day (BID) | NASAL | 5 refills | Status: DC

## 2021-10-05 NOTE — Patient Instructions (Addendum)
  1.  For recent "cold" use the following:  A. Nasal saline a few times per day B. OTC loratadine 10 mg - 1 tablet 2 times per day  2. Treat and prevent inflammation:  A. Ryaltris - 2 sprays each nostril 1-2 times per day (specialty pharmacy)  3. If needed:  A. Albuterol HFA - 2 inhalations every 4-6 hours B. Epi-Pen, benadryl, MD/ER evaluation for allergic reaction  4. Return to clinic for skin testing for aeroallergens and foods without antihistamine use

## 2021-10-05 NOTE — Telephone Encounter (Signed)
She can use Mucinex DM with the DM compounding the cough suppressant.

## 2021-10-05 NOTE — Progress Notes (Signed)
[place] - [place] - [place] - [place] - [place]   Dear Dr. Tamala Julian,  Thank you for referring [name] to the [facility] on 10/05/2021.   Below is a summation of this patient's evaluation and recommendations.  Thank you for your referral. I will keep you informed about this patient's response to treatment.   If you have any questions please do not hesitate to contact me.   Sincerely,  Jiles Prows, MD Allergy / Immunology [facility]   ______________________________________________________________________    NEW PATIENT NOTE  Referring Provider: Glendon Axe, MD Primary Provider: Glendon Axe, MD Date of office visit: 10/05/2021    Subjective:   Chief Complaint:  Nicole Rasmussen (DOB: [birth year]) is a 61 y.o. female who presents to the clinic on 10/05/2021 with a chief complaint of Asthma (Patient states she has had difficulty in breathing x 6 mths - nodule on right lung - gets checked every 6 mths) and Allergy Testing (Environmental:/Food: Shellfish//Patient states she took Benadryl last night.) .     HPI: Nicole Rasmussen presents to this clinic in evaluation of "sinus" and allergies and sensitivity directed against lobster.  Apparently she was seen in this clinic by Dr. Verlin Fester almost a decade ago.  Taisha describes "sinus".  This issue has been particularly bad over the course of the past 4 to 5 months but has also been an issue that been in existence for years.  She gets runny nose and sometimes dry nose and sometimes nasal congestion.  She has minimal sneezing.  She will get sinus pressure affecting the right side of her face more than the left face.  Sometimes this starts in her eye area and migrate to her temple region.  There does not appear to be any associated scotoma or dizziness.  She does not have any anosmia.  She has been given some nasal steroids in the past which  she uses intermittently.  Occasionally she will get bleeding out of her right nostril.  She has a distant history of asthma presenting with shortness of breath.  Her requirement for albuterol is about 1 or 2 times per month.  She has not required a systemic steroid in years for this issue.  She states that when eating lobster she developed hives.  All other shellfish can be consumed with no problem.  She was visiting her sister in [place], who passed away this weekend, and she contracted a cold manifested as coughing and congestion in her head and some shortness of breath.  She does not have any high fever or ugly nasal discharge or anosmia or chest pain or sputum production.  There were individuals who were around her during that trip that had a similar issue.  Past Medical History:  Diagnosis Date   Acid reflux    Allergy    Anemia    Arthritis    Asthma    no inhalers   B12 deficiency    Dysmetabolic syndrome X    Dyspnea    Food allergy    shellfish   GERD (gastroesophageal reflux disease)    HLD (hyperlipidemia)    Joint pain    knees and back   Lower back pain    Nerve pain    top of right thigh   Neuromuscular disorder (HCC)    bilateral leg pain from back issue   Prediabetes    Shortness of breath    Sinus  problem    Swallowing difficulty    Vitamin B 12 deficiency    Vitamin D deficiency     Past Surgical History:  Procedure Laterality Date   CESAREAN SECTION     1 time   COLONOSCOPY     ESOPHAGOGASTRODUODENOSCOPY     FOOT SURGERY Bilateral 1980's   bunionectomy and hammer toe on both feet   MOUTH SURGERY  2011   had implants/ root canal/bridges   WISDOM TOOTH EXTRACTION     with the implants    Allergies as of 10/05/2021       Reactions   Shellfish Allergy Hives, Swelling   Latex Hives   hives   Sulfonamide Derivatives Other (See Comments)   Rash, itching        Medication List    albuterol 108 (90 Base) MCG/ACT inhaler Commonly known as:  VENTOLIN HFA Inhale into the lungs every 6 (six) hours as needed for wheezing or shortness of breath.   aspirin EC 81 MG tablet Take 81 mg by mouth daily.   atorvastatin 10 MG tablet Commonly known as: LIPITOR Take 10 mg by mouth daily.   cyclobenzaprine 10 MG tablet Commonly known as: FLEXERIL Take 10 mg by mouth 3 (three) times daily as needed for muscle spasms.   dicyclomine 20 MG tablet Commonly known as: BENTYL Take 1 tablet (20 mg total) by mouth 3 (three) times daily before meals.   fluticasone 50 MCG/ACT nasal spray Commonly known as: FLONASE Place into both nostrils daily.   gabapentin 400 MG capsule Commonly known as: NEURONTIN Take 1 capsule by mouth 3 (three) times daily as needed.   meloxicam 15 MG tablet Commonly known as: MOBIC Take 15 mg by mouth daily.   traMADol 50 MG tablet Commonly known as: ULTRAM Take 50 mg by mouth every 6 (six) hours as needed.    Review of systems negative except as noted in HPI / PMHx or noted below:  Review of Systems  Constitutional: Negative.   HENT: Negative.    Eyes: Negative.   Respiratory: Negative.    Cardiovascular: Negative.   Gastrointestinal: Negative.   Genitourinary: Negative.   Musculoskeletal: Negative.   Skin: Negative.   Neurological: Negative.   Endo/Heme/Allergies: Negative.   Psychiatric/Behavioral: Negative.     Family History  Problem Relation Age of Onset   Cancer Mother    Hypertension Mother    Hyperlipidemia Mother    Heart disease Mother    Breast cancer Mother    Cancer Father    Kidney disease Father    Prostate cancer Father    Hyperlipidemia Sister    Diabetes Sister    Hypertension Sister    Hypertension Sister    Hypertension Sister    Hypertension Sister    Hyperlipidemia Brother    Hypertension Brother    Prostate cancer Brother    Hypertension Brother    Hypertension Brother    Hypertension Brother    Colon cancer Neg Hx    Esophageal cancer Neg Hx    Pancreatic  cancer Neg Hx    Stomach cancer Neg Hx     Social History   Socioeconomic History   Marital status: Married    Spouse name: Izell Fowler   Number of children: 1   Years of education: BA   Highest education level: Not on file  Occupational History   Occupation: Retired, Quarry manager    Employer: [employer]  Tobacco Use   Smoking status: Former  Packs/day: 1.00    Years: 20.00    Pack years: 20.00    Types: Cigarettes    Quit date: 05/02/1996    Years since quitting: 25.4    Passive exposure: Never   Smokeless tobacco: Never  Vaping Use   Vaping Use: Never used  Substance and Sexual Activity   Alcohol use: Yes    Comment: social   Drug use: No   Sexual activity: Yes    Birth control/protection: Post-menopausal  Other Topics Concern   Not on file  Social History Narrative   Occasional caffeine use    Environmental and Social history  Lives in a house with a dry environment, no animals located inside the household, no carpet in the bedroom, plastic on the bed, plastic on the pillow, and no smoking ongoing with inside the household.  She is a retired Quarry manager.  Objective:   Vitals:   10/05/21 1442  BP: (!) 164/96  Pulse: 95  Resp: 20  Temp: (!) 96.5 F (35.8 C)  SpO2: 95%   Height: '5\' 8"'$  (172.7 cm) Weight: 272 lb 9.6 oz (123.7 kg)  Physical Exam Constitutional:      Appearance: She is not diaphoretic.  HENT:     Head: Normocephalic.     Right Ear: Tympanic membrane, ear canal and external ear normal.     Left Ear: Tympanic membrane, ear canal and external ear normal.     Nose: Nose normal. No mucosal edema or rhinorrhea.     Mouth/Throat:     Pharynx: Uvula midline. No oropharyngeal exudate.  Eyes:     Conjunctiva/sclera: Conjunctivae normal.  Neck:     Thyroid: No thyromegaly.     Trachea: Trachea normal. No tracheal tenderness or tracheal deviation.  Cardiovascular:     Rate and Rhythm: Normal rate and regular rhythm.     Heart sounds:  Normal heart sounds, S1 normal and S2 normal. No murmur heard. Pulmonary:     Effort: No respiratory distress.     Breath sounds: Normal breath sounds. No stridor. No wheezing or rales.  Lymphadenopathy:     Head:     Right side of head: No tonsillar adenopathy.     Left side of head: No tonsillar adenopathy.     Cervical: No cervical adenopathy.  Skin:    Findings: No erythema or rash.     Nails: There is no clubbing.  Neurological:     Mental Status: She is alert.    Diagnostics: Allergy skin tests were not performed secondary to the recent administration of Benadryl.   Spirometry was performed.  Her FEV1 was 2.03 which was 86% of predicted.  FEV1/FVC = 0.79  Assessment and Plan:    1. Perennial allergic rhinitis   2. Asthma, mild intermittent, well-controlled   3. Viral upper respiratory tract infection with cough   4. Allergy with anaphylaxis due to food     1.  For recent "cold" use the following:  A. Nasal saline a few times per day B. OTC loratadine 10 mg - 1 tablet 2 times per day  2. Treat and prevent inflammation:  A. Ryaltris - 2 sprays each nostril 1-2 times per day (specialty pharmacy)  3. If needed:  A. Albuterol HFA - 2 inhalations every 4-6 hours B. Epi-Pen, benadryl, MD/ER evaluation for allergic reaction  4. Return to clinic for skin testing for aeroallergens and foods without antihistamine use  Unfortunately Deavion appears to have a viral respiratory tract infection when she  arrived to clinic today and she took Benadryl last night so we cannot really under have her undergo any further evaluation for her aeroallergen hypersensitivity and provide her allergen avoidance measures.  Hopefully within the next 5 days or so this cold will resolve and we will get her back in this clinic to undergo skin testing for both her aeroallergen hypersensitivity and her apparent food hypersensitivity.  She can use a combination nasal steroid and nasal antihistamine spray  as noted above.  I will see her back in this clinic for skin testing.  Jiles Prows, MD Allergy / Immunology [facility]

## 2021-10-05 NOTE — Telephone Encounter (Signed)
Called patient - DOB verified - advised of provider notation below. Patient verbalized understanding, stating someone had already called - no documentation noted - apologized for the additional call, no further questions.

## 2021-10-05 NOTE — Telephone Encounter (Signed)
Patient would like Dr. Neldon Mc to send in some cough syrup for her.   [place] [id number]  Best contact number: [phone]

## 2021-10-06 ENCOUNTER — Encounter: Payer: Self-pay | Admitting: Allergy and Immunology

## 2021-10-20 ENCOUNTER — Ambulatory Visit: Payer: 59 | Admitting: Internal Medicine

## 2021-11-30 ENCOUNTER — Ambulatory Visit: Payer: 59 | Admitting: Internal Medicine

## 2021-11-30 ENCOUNTER — Encounter: Payer: Self-pay | Admitting: Internal Medicine

## 2021-11-30 VITALS — BP 140/84 | HR 74 | Temp 97.6°F | Resp 16 | Ht 68.0 in | Wt 272.6 lb

## 2021-11-30 DIAGNOSIS — J4520 Mild intermittent asthma, uncomplicated: Secondary | ICD-10-CM | POA: Diagnosis not present

## 2021-11-30 DIAGNOSIS — L500 Allergic urticaria: Secondary | ICD-10-CM | POA: Diagnosis not present

## 2021-11-30 DIAGNOSIS — J3089 Other allergic rhinitis: Secondary | ICD-10-CM

## 2021-11-30 MED ORDER — METHYLPREDNISOLONE ACETATE 80 MG/ML IJ SUSP
80.0000 mg | Freq: Once | INTRAMUSCULAR | Status: AC
  Administered 2021-11-30: 80 mg via INTRAMUSCULAR

## 2021-11-30 NOTE — Progress Notes (Signed)
Follow Up Note  RE: Nicole Rasmussen MRN: [id number] DOB: [birth year] Date of Office Visit: 11/30/2021  Referring provider: Glendon Axe, MD Primary care provider: Glendon Axe, MD  Chief Complaint: Allergic Reaction (Patient states she used a new bodywash - Dove Mango and [place] Dooms Monday morning - starting breaking out Monday afternoon (2-3 pm)  - L arm, Sides - Bilateral, back  - middle./Patient states she took Zyrtec, Benadryl and Claritin with some relief.)  History of Present Illness: I had the pleasure of seeing Nicole Rasmussen for a follow up visit at the [facility] on 11/30/2021. She is a 61 y.o. female, who is being followed for food allergy, allergic rhinitis, asthma. Her previous allergy office visit was on 10/05/21 with Dr. Neldon Mc. Today is a new complaint visit of rash  .  History obtained from patient  and  chart review  .  At last visit allergy testing was deferred due to recent [place] and benadryl use.    Reports using a new mango scented body wash yesterday morning and afternoon.  She developed development of urticaria yesterday afternoon after eating chicken and pasta salad approximately a few hours afterwards.  She self treated with benadryl and claritin and hydrocortisone cream with no response.  She did eat some shrimp on Sunday around 6pm.  She previously tolerated shrimp and crab, but has a history of urticaria with lobster.  She has an epipen but did not [name] it for current symptoms.  She has not had updated food testing.  She denies any other systemic symptoms with current presentation.  She has a history of allergic rhinitis which she takes rylatris and OTC loratadine for.  She previously followed with Dr. Verlin Fester 10 years ago for symptoms and had recently re[facility] care with [facility] for rhinorrhea, nasal congestion and sinus pressure.  She still due for updated allergy testing but had to defer recent visit due to  URI.  Assessment and Plan: Nicole Rasmussen is a 61 y.o. female with: Allergic urticaria - Plan: Allergen Profile, Shellfish  Other allergic rhinitis  Asthma, mild intermittent, well-controlled Plan: Patient Instructions   1.  For urticaria - likely due to shellfish allergy   A.  Solu-Medrol IM injection given today in clinic  B. OTC loratadine '10mg'$  twice a day for 7 days  C. Avoid all shellfish, will get blood work to further evaluation   2. Treat and prevent inflammation:  A. Ryaltris - 2 sprays each nostril 1-2 times per day  3. If needed:  A. Albuterol HFA - 2 inhalations every 4-6 hours B. Epi-Pen, benadryl, MD/ER evaluation for allergic reaction  4. We will call you with blood results for shellfish and go from there     Thank you so much for letting me partake in your care today.  Don't hesitate to reach out if you have any additional concerns!  Roney Marion, MD  [facility] [place] [place] No follow-ups on file.  Meds ordered this encounter  Medications   methylPREDNISolone acetate (DEPO-MEDROL) injection 80 mg    Lab Orders         Allergen Profile, Shellfish     Diagnostics: None performed    Medication List:  Current Outpatient Medications  Medication Sig Dispense Refill   albuterol (VENTOLIN HFA) 108 (90 Base) MCG/ACT inhaler Inhale 2 puffs into the lungs every 4 (four) hours as needed for wheezing or shortness of breath. 18 g 1   aspirin EC  81 MG tablet Take 81 mg by mouth daily.     atorvastatin (LIPITOR) 10 MG tablet Take 10 mg by mouth daily.     cyclobenzaprine (FLEXERIL) 10 MG tablet Take 10 mg by mouth 3 (three) times daily as needed for muscle spasms.     EPINEPHrine (EPIPEN 2-PAK) 0.3 mg/0.3 mL IJ SOAJ injection Inject 0.3 mg into the muscle as needed for anaphylaxis. 2 each 1   fluticasone (FLONASE) 50 MCG/ACT nasal spray Place into both nostrils daily.     gabapentin (NEURONTIN) 400 MG capsule Take 1 capsule by mouth 3 (three)  times daily as needed.     Ibuprofen-Famotidine (DUEXIS) 800-26.6 MG TABS Take 1 tablet by mouth in the morning, at noon, and at bedtime.     loratadine (CLARITIN) 10 MG tablet Take 1 tablet (10 mg total) by mouth 2 (two) times daily. 60 tablet 5   meloxicam (MOBIC) 15 MG tablet Take 15 mg by mouth daily.     Olopatadine-Mometasone (RYALTRIS) [id number] MCG/ACT SUSP Place 2 sprays into the nose 2 (two) times daily. 29 g 5   traMADol (ULTRAM) 50 MG tablet Take 50 mg by mouth every 6 (six) hours as needed.     ergocalciferol (VITAMIN D2) 1.25 MG (50000 UT) capsule Take 1 capsule by mouth once a week.     HYDROcodone-acetaminophen (NORCO) 10-325 MG tablet Take 1 tablet by mouth 3 (three) times daily as needed.     potassium chloride (MICRO-K) 10 MEQ CR capsule Take 1 capsule by mouth 2 (two) times daily.     No current facility-administered medications for this visit.   Allergies: Allergies  Allergen Reactions   Shellfish Allergy Hives and Swelling   Latex Hives    hives   Sulfonamide Derivatives Other (See Comments)    Rash, itching   I reviewed her past medical history, social history, family history, and environmental history and no significant changes have been reported from her previous visit.  ROS: All others negative except as noted per HPI.   Objective: BP (!) 140/84   Pulse 74   Temp 97.6 F (36.4 C)   Resp 16   Ht '5\' 8"'$  (1.727 m)   Wt 272 lb 9.6 oz (123.7 kg)   LMP 10/11/2010   SpO2 98%   BMI 41.45 kg/m  Body mass index is 41.45 kg/m. General Appearance:  Alert, cooperative, no distress, appears stated age  Head:  Normocephalic, without obvious abnormality, atraumatic  Eyes:  Conjunctiva clear, EOM's intact  Nose: Nares normal,   Throat: Lips, tongue normal; teeth and gums normal,   Neck: Supple, symmetrical  Lungs:   , Respirations unlabored, no coughing  Heart:  regular rate and rhythm and no murmur, Appears well perfused  Extremities: No edema  Skin: Skin color,  texture, turgor normal, scattered erythematous macules and papules on back, arms  Neurologic: No gross deficits   Previous notes and tests were reviewed. The plan was reviewed with the patient/family, and all questions/concerned were addressed.  It was my pleasure to see Nicole Rasmussen today and participate in her care. Please feel free to contact me with any questions or concerns.  Sincerely,  Roney Marion, MD  Allergy & Immunology  [facility] Office: [phone]

## 2021-11-30 NOTE — Patient Instructions (Addendum)
  1.  For urticaria - likely due to shellfish allergy   A.  Solu-Medrol IM injection given today in clinic  B. OTC loratadine '10mg'$  twice a day for 7 days  C. Avoid all shellfish, will get blood work to further evaluation   2. Treat and prevent inflammation:  A. Ryaltris - 2 sprays each nostril 1-2 times per day  3. If needed:  A. Albuterol HFA - 2 inhalations every 4-6 hours B. Epi-Pen, benadryl, MD/ER evaluation for allergic reaction  4. We will call you with blood results for shellfish and go from there     Thank you so much for letting me partake in your care today.  Don't hesitate to reach out if you have any additional concerns!  Roney Marion, MD  [facility] [place], [place]

## 2021-12-03 LAB — ALLERGEN PROFILE, SHELLFISH
Clam IgE: <0.1 kU/L
F023-IgE Crab: <0.1 kU/L
F080-IgE Lobster: <0.1 kU/L
F290-IgE Oyster: <0.1 kU/L
Scallop IgE: <0.1 kU/L
Shrimp IgE: <0.1 kU/L

## 2021-12-03 NOTE — Progress Notes (Signed)
Blood work to shellfish returned negative.  I would like to schedule her for skin testing to confirm but want her to recover from recent flare first.

## 2021-12-08 ENCOUNTER — Encounter (INDEPENDENT_AMBULATORY_CARE_PROVIDER_SITE_OTHER): Payer: Self-pay

## 2021-12-16 NOTE — Progress Notes (Signed)
Initial neurology clinic note  SERVICE DATE: 12/17/21 SERVICE TIME: 2:30 pm  Reason for Evaluation: Consultation requested by Gatha Mayer, MD for an opinion regarding diffuse neuropathic pain. My final recommendations will be communicated back to the requesting physician by way of shared medical record or letter to requesting physician via [name] mail.  HPI: This is Ms. Nicole Rasmussen, a 61 y.o. right-handed female with a medical history of B12 deficiency, OA c/b joint pain and back pain, GERD, HLD, prediabetes, vit D deficiency who presents to neurology clinic with the chief complaint above. The patient is alone today.  Patient is having tingling in her left neck, arm, and leg. When she has back pain, it is also on the left. The tingling in the arms started in the last 6 months. It was coming and going until about 2 months ago when it became constant. It is a shock like sensation. It radiates from her neck into all of her fingers. Bending her neck (like when shampooing at the hair salon) makes the sensations worse. The pain is particularly uncomfortable at night and can wake her up. She can straighten her arm and it will help sometimes. She sleeps on her side, but tries to avoid her left side. She often has her arms curled up. The pain can be 10/10, but averages 6/10. It occasionally can complete go away. She occasionally gets similar symptoms on the right but very rare. She denies weakness. She does have pain in her right knee that makes walking difficult at times.   She takes gabapentin 400 mg QID (given by PCP). It helps knock down the pain along with diclofenac, flexeril, and tylenol. She has previously tried PT (many years ago) for her back and this did not help much.  She also endorses tingling in both feet (maybe right more than left). She does not currently have this tingling though.  Patient was seen by Dr. Carlean Purl in GI for abdominal pain of unknown origin. The clinic note from  09/17/21 was reviewed. Patient had previously been seen by [facility], but wanted second opinion for her pain, so consult to [facility] neurology was placed.   Patient previously saw Dr. Rexene Alberts at [facility] (last seen 05/06/20). The clinic notes indicate patient had burning/tingling sensations in her right thigh. She also described radiating pain from lower back and a long standing history of low back pain. Per 12/15/15 office note: "she had a lumbar spine MRI without contrast at [facility] on 09/27/2013: Depression: This is an abnormal MRI of the lumbar spine showing degenerative changes at L3-L4, L4-L5, and L5-S1 as detailed above. The post significant findings are at L5-S1 were severe facet hypertrophy causes moderate left foraminal narrowing that could lead to dynamic impingement of the left L5 nerve root." Patient had EMG on 01/27/16. Per clinic note on 04/18/16, the impression was:  This NCV/EMG study showed the following: 1.    No evidence of polyneuropathy.    2.    Mild chronic right L5 radiculopathy without active features. 3.    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  MRI of lumbar spine showed degenerative changes so patient was referred to spine. She saw Dr. Erlinda Hong in othropedics. The note from 10/10/20 indicates she was seen for low back pain. Patient was referred to PT, given diclofenac, and recommended to lose weight.  EtOH use: Socially, beer or wine (1-2 per day; 4 times per week)  Restrictive diet? No Family history  of neuropathy/myopathy/NM disease? None   MEDICATIONS:  Outpatient Encounter Medications as of 12/17/2021  Medication Sig   albuterol (VENTOLIN HFA) 108 (90 Base) MCG/ACT inhaler Inhale 2 puffs into the lungs every 4 (four) hours as needed for wheezing or shortness of breath.   aspirin EC 81 MG tablet Take 81 mg by mouth daily.   atorvastatin (LIPITOR) 10 MG tablet Take 10 mg by mouth daily.   cyclobenzaprine  (FLEXERIL) 10 MG tablet Take 10 mg by mouth 3 (three) times daily as needed for muscle spasms.   diclofenac (VOLTAREN) 75 MG EC tablet Take 75 mg by mouth 2 (two) times daily.   EPINEPHrine (EPIPEN 2-PAK) 0.3 mg/0.3 mL IJ SOAJ injection Inject 0.3 mg into the muscle as needed for anaphylaxis.   gabapentin (NEURONTIN) 400 MG capsule Take 1 capsule by mouth 3 (three) times daily as needed.   loratadine (CLARITIN) 10 MG tablet Take 1 tablet (10 mg total) by mouth 2 (two) times daily.   Olopatadine-Mometasone (RYALTRIS) [id number] MCG/ACT SUSP Place 2 sprays into the nose 2 (two) times daily.   potassium chloride (MICRO-K) 10 MEQ CR capsule Take 1 capsule by mouth 2 (two) times daily.   [DISCONTINUED] ergocalciferol (VITAMIN D2) 1.25 MG (50000 UT) capsule Take 1 capsule by mouth once a week. (Patient not taking: Reported on 12/17/2021)   [DISCONTINUED] fluticasone (FLONASE) 50 MCG/ACT nasal spray Place into both nostrils daily. (Patient not taking: Reported on 12/17/2021)   [DISCONTINUED] HYDROcodone-acetaminophen (NORCO) 10-325 MG tablet Take 1 tablet by mouth 3 (three) times daily as needed. (Patient not taking: Reported on 12/17/2021)   [DISCONTINUED] Ibuprofen-Famotidine (DUEXIS) 800-26.6 MG TABS Take 1 tablet by mouth in the morning, at noon, and at bedtime. (Patient not taking: Reported on 12/17/2021)   [DISCONTINUED] meloxicam (MOBIC) 15 MG tablet Take 15 mg by mouth daily. (Patient not taking: Reported on 12/17/2021)   [DISCONTINUED] traMADol (ULTRAM) 50 MG tablet Take 50 mg by mouth every 6 (six) hours as needed. (Patient not taking: Reported on 12/17/2021)   No facility-administered encounter medications on file as of 12/17/2021.    PAST MEDICAL HISTORY: Past Medical History:  Diagnosis Date   Acid reflux    Allergy    Anemia    Arthritis    Asthma    no inhalers   B12 deficiency    Dysmetabolic syndrome X    Dyspnea    Food allergy    shellfish   GERD (gastroesophageal reflux disease)     HLD (hyperlipidemia)    Joint pain    knees and back   Lower back pain    Nerve pain    top of right thigh   Neuromuscular disorder (HCC)    bilateral leg pain from back issue   Prediabetes    Shortness of breath    Sinus problem    Swallowing difficulty    Vitamin B 12 deficiency    Vitamin D deficiency     PAST SURGICAL HISTORY: Past Surgical History:  Procedure Laterality Date   CESAREAN SECTION     1 time   COLONOSCOPY     ESOPHAGOGASTRODUODENOSCOPY     FOOT SURGERY Bilateral 1980's   bunionectomy and hammer toe on both feet   MOUTH SURGERY  2011   had implants/ root canal/bridges   WISDOM TOOTH EXTRACTION     with the implants    ALLERGIES: Allergies  Allergen Reactions   Shellfish Allergy Hives and Swelling   Latex Hives    hives  Sulfonamide Derivatives Other (See Comments)    Rash, itching    FAMILY HISTORY: Family History  Problem Relation Age of Onset   Cancer Mother    Hypertension Mother    Hyperlipidemia Mother    Heart disease Mother    Breast cancer Mother    Cancer Father    Kidney disease Father    Prostate cancer Father    Hyperlipidemia Sister    Diabetes Sister    Hypertension Sister    Hypertension Sister    Hypertension Sister    Hypertension Sister    Hyperlipidemia Brother    Hypertension Brother    Prostate cancer Brother    Hypertension Brother    Hypertension Brother    Hypertension Brother    Colon cancer Neg Hx    Esophageal cancer Neg Hx    Pancreatic cancer Neg Hx    Stomach cancer Neg Hx     SOCIAL HISTORY: Social History   Tobacco Use   Smoking status: Former    Packs/day: 1.00    Years: 20.00    Total pack years: 20.00    Types: Cigarettes    Quit date: 05/02/1996    Years since quitting: 25.6    Passive exposure: Never   Smokeless tobacco: Never  Vaping Use   Vaping Use: Never used  Substance Use Topics   Alcohol use: Yes    Comment: social   Drug use: No   Social History   Social History  Narrative   Occasional caffeine use    Right handed    Live in a one level home     OBJECTIVE: PHYSICAL EXAM: BP 131/86   Pulse 88   Ht '5\' 8"'$  (1.727 m)   Wt 272 lb (123.4 kg)   LMP 10/11/2010   SpO2 97%   BMI 41.36 kg/m   General: General appearance: Awake and alert. No distress. Cooperative with exam.  Skin: No obvious rash or jaundice. HEENT: Atraumatic. Anicteric. Increase in left arm tingling with neck flexion and extension. Lungs: Non-labored breathing on room air  Extremities: No edema. No obvious deformity.  Musculoskeletal: No obvious joint swelling. Psych: Affect appropriate.  Neurological: Mental Status: Alert. Speech fluent. No pseudobulbar affect Cranial Nerves: CNII: No RAPD. Visual fields intact. CNIII, IV, VI: PERRL. No nystagmus. EOMI. CN V: Facial sensation intact bilaterally to fine touch. CN VII: Facial muscles symmetric and strong. No ptosis at rest. CN VIII: Hears finger rub well bilaterally. CN IX: No hypophonia. CN X: Palate elevates symmetrically. CN XI: Full strength shoulder shrug bilaterally. CN XII: Tongue protrusion full and midline. No atrophy or fasciculations. No significant dysarthria Motor: Tone is normal. No fasciculations in extremities. No atrophy.  Individual muscle group testing (MRC grade out of 5):  Movement     Neck flexion 5    Neck extension 5     Right Left   Shoulder abduction 5 5   Shoulder adduction 5 5   Shoulder ext rotation 5 5   Shoulder int rotation 5 5   Elbow flexion 5 5   Elbow extension 5 5   Wrist extension 5 5   Wrist flexion 5 5   Finger abduction - FDI 5 5   Finger abduction - ADM 5 5   Finger extension 5 5   Finger distal flexion - 2/'3 5 5   '$ Finger distal flexion - 4/'5 5 5   '$ Thumb flexion - FPL 5 5   Thumb abduction - APB 5 5  Hip flexion 5 5   Hip extension 5 5   Hip adduction 5 5   Hip abduction 5 5   Knee extension 5 5   Knee flexion 5 5   Dorsiflexion 5 5   Plantarflexion 5 5      Reflexes:  Right Left   Bicep 2+ 1+   Tricep 2+ 1+   BrRad 2+ 1+   Knee 2+ 2+   Ankle 2+ 2+    Pathological Reflexes: Babinski: Flexor response bilaterally Hoffman: Absent on right, present on left Troemner: absent bilaterally  Sensation: Pinprick: Intact in all extremities Vibration: Intact in all extremities Temperature: Intact in all extremities  Coordination: Intact finger-to- nose-finger bilaterally. Romberg negative. Gait: Able to rise from chair with arms crossed unassisted. Normal, narrow-based gait.  Lab and Test Review: Component     Latest Ref Rng 06/26/2019 08/20/2019 03/01/2021  WBC     4.0 - 10.5 K/uL     RBC     3.87 - 5.11 Mil/uL     Hemoglobin     12.0 - 15.0 g/dL     HCT     36.0 - 46.0 %     MCV     78.0 - 100.0 fl     MCHC     30.0 - 36.0 g/dL     RDW     11.5 - 15.5 %     Platelets     150.0 - 400.0 K/uL     Neutrophils     43.0 - 77.0 %     Lymphocytes     12.0 - 46.0 %     Monocytes Relative     3.0 - 12.0 %     Eosinophil     0.0 - 5.0 %     Basophil     0.0 - 3.0 %     NEUT#     1.4 - 7.7 K/uL     Lymphocyte #     0.7 - 4.0 K/uL     Monocyte #     0.1 - 1.0 K/uL     Eosinophils Absolute     0.0 - 0.7 K/uL     Basophils Absolute     0.0 - 0.1 K/uL     Sodium     135 - 145 mmol/L   139   Potassium     3.5 - 5.1 mmol/L   3.5   Chloride     98 - 111 mmol/L   104   CO2     22 - 32 mmol/L   26   Glucose     70 - 99 mg/dL   99   BUN     8 - 23 mg/dL   22   Creatinine     0.44 - 1.00 mg/dL   0.99   Calcium     8.9 - 10.3 mg/dL   9.3   GFR, Estimated     >60 mL/min   >60   Anion gap     5 - 15    9   Vitamin B12     232 - 1,245 pg/mL 535     Vitamin D, 25-Hydroxy  58.56 (E)   Hemoglobin A1C  6.3 (E)   TSH     0.41 - 5.90   1.44 (E)    Component     Latest Ref Rng 06/16/2021  WBC     4.0 - 10.5 K/uL 6.6   RBC  3.87 - 5.11 Mil/uL 4.69   Hemoglobin     12.0 - 15.0 g/dL 12.6   HCT     36.0 - 46.0 % 39.0    MCV     78.0 - 100.0 fl 83.3   MCHC     30.0 - 36.0 g/dL 32.3   RDW     11.5 - 15.5 % 14.6   Platelets     150.0 - 400.0 K/uL 160.0   Neutrophils     43.0 - 77.0 % 50.9   Lymphocytes     12.0 - 46.0 % 37.0   Monocytes Relative     3.0 - 12.0 % 8.8   Eosinophil     0.0 - 5.0 % 2.7   Basophil     0.0 - 3.0 % 0.6   NEUT#     1.4 - 7.7 K/uL 3.3   Lymphocyte #     0.7 - 4.0 K/uL 2.4   Monocyte #     0.1 - 1.0 K/uL 0.6   Eosinophils Absolute     0.0 - 0.7 K/uL 0.2   Basophils Absolute     0.0 - 0.1 K/uL 0.0   Sodium     135 - 145 mmol/L   Potassium     3.5 - 5.1 mmol/L   Chloride     98 - 111 mmol/L   CO2     22 - 32 mmol/L   Glucose     70 - 99 mg/dL   BUN     8 - 23 mg/dL   Creatinine     0.44 - 1.00 mg/dL   Calcium     8.9 - 10.3 mg/dL   GFR, Estimated     >60 mL/min   Anion gap     5 - 15    Vitamin B12     232 - 1,245 pg/mL 535 (06/26/19)  Vitamin D, 25-Hydroxy   Hemoglobin A1C   TSH     0.41 - 5.90        MRI lumbar spine (11/17/16): FINDINGS:  The lumbar vertebrae demonstrate normal alignment, body height and marrow signal. T12-L1 and L1-L2show only minor facet hypertrophy without compression.L2-3 shows asymmetric facet hypertrophy resulting in mild left narrowing but no compression.L3-4 shows severe lateral disc osteophyte bulge to the left and  asymmetric facet hypertrophy resulting in severe foraminal narrowing on left and likely impingement on the exiting nerve root on left.L4-5 also shows similar changes but moderate narrowing the left foramina.Marland Kitchen L5-S1 also shows asymmetric facet hypertrophy with severe left sided foraminall narrowing with likely impingement of the exiting nerve root.the conus medullaris terminates at L1. The visualized lower thoracic vertebrae and paraspinal soft tissue appear unremarkable.   MRI cervical spine (08/28/10): Findings: There is no abnormality at the foramen magnum, C1-2, C2-  3, C3-4 or C4-5.  The discs are normal.   The canal and foramina are  widely patent.  No osseous or articular finding.   At C5-6, there is spondylosis with endplate osteophytes and shallow  broad-based herniation more towards the left.  This effaces the  ventral subarachnoid space indents the cord slightly.  No foraminal  extension is seen.   C6-7, C7-T1 and T1-2 are normal.   IMPRESSION:  Single level pathology at C5-6, there is disc degeneration with  endplate osteophytes and shallow left posterolateral predominant  disc herniation.  This effaces the ventral subarachnoid space and  indents the cord slightly on the left.  No foraminal  extension is  detected.   EMG (01/27/16 at [facility]): 1.    No evidence of polyneuropathy.    2.    Mild chronic right L5 radiculopathy without active features. 3.    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  ASSESSMENT: Tahja Liao is a 60 y.o. female who presents for evaluation of neck pain and left arm tingling and bilateral foot numbness/tingling. She has a relevant medical history of B12 deficiency, OA c/b joint pain and back pain, GERD, HLD, prediabetes, vit D deficiency. Her neurological examination is pertinent for increased tingling in left arm with neck flexion and extension. Overall, neck and left arm symptoms are likely related and perhaps secondary to cervical radiculopathy. A focal neuropathy such as ulnar or median neuropathy is also possible, but less likely. Her numbness and tingling in feet could be due to prior lumbar radiculopathy vs peripheral neuropathy (maybe due to prediabetes).  PLAN: -Blood work: B12, SPEP, IFE to evaluate for treatable causes of neuropathy -EMG for left upper extremity -Consult to physical therapy for neck and left arm pain -Continue gabapentin 400 mg QID, flexeril, diclofenac, and tylenol as needed for pain. Can consider increasing gabapentin if not getting symptom relief.  -Return to clinic 3  months  The impression above as well as the plan as outlined below were extensively discussed with the patient who voiced understanding. All questions were answered to their satisfaction.  When available, results of the above investigations and possible further recommendations will be communicated to the patient via telephone/MyChart. Patient to call office if not contacted after expected testing turnaround time.   Total time spent reviewing records, interview, history/exam, documentation, and coordination of care on day of encounter:  70 min   Thank you for allowing me to participate in patient's care.  If I can answer any additional questions, I would be pleased to do so.  Kai Levins, MD   CC: [address], [place] [id number]  CC: Referring provider: Gatha Mayer, MD [address],  [place] [id number]

## 2021-12-17 ENCOUNTER — Encounter: Payer: Self-pay | Admitting: Neurology

## 2021-12-17 ENCOUNTER — Ambulatory Visit: Payer: 59 | Admitting: Neurology

## 2021-12-17 ENCOUNTER — Other Ambulatory Visit (INDEPENDENT_AMBULATORY_CARE_PROVIDER_SITE_OTHER): Payer: 59

## 2021-12-17 VITALS — BP 131/86 | HR 88 | Ht 68.0 in | Wt 272.0 lb

## 2021-12-17 DIAGNOSIS — R202 Paresthesia of skin: Secondary | ICD-10-CM

## 2021-12-17 DIAGNOSIS — M5412 Radiculopathy, cervical region: Secondary | ICD-10-CM | POA: Diagnosis not present

## 2021-12-17 DIAGNOSIS — M542 Cervicalgia: Secondary | ICD-10-CM

## 2021-12-17 DIAGNOSIS — R209 Unspecified disturbances of skin sensation: Secondary | ICD-10-CM

## 2021-12-17 DIAGNOSIS — R200 Anesthesia of skin: Secondary | ICD-10-CM | POA: Diagnosis not present

## 2021-12-17 LAB — VITAMIN B12: Vitamin B-12: 284 pg/mL (ref 211–911)

## 2021-12-17 NOTE — Addendum Note (Signed)
Addended by: Renae Gloss on: 12/17/2021 04:06 PM   Modules accepted: Orders

## 2021-12-17 NOTE — Patient Instructions (Signed)
We saw you today for left arm numbness and tingling. This is likely coming from your neck given your neck pain. We will evaluate with the following:  Lab work EMG of left arm  For your pain, continue the gabapentin, diclofenac, flexeril, and tylenol. I am also referring you for physical therapy for your neck and arm pain.  Follow up in 3 months. Please let me know if you have any questions or concerns in the meantime.   Kai Levins, MD

## 2021-12-17 NOTE — Addendum Note (Signed)
Addended by: Jodi Marble, Monchel Pollitt I on: 12/17/2021 04:07 PM   Modules accepted: Orders

## 2021-12-21 LAB — IMMUNOFIXATION ELECTROPHORESIS
IgG (Immunoglobin G), Serum: 1227 mg/dL (ref 600–1540)
IgM, Serum: 253 mg/dL (ref 50–300)
Immunofix Electr Int: NOT DETECTED
Immunoglobulin A: 265 mg/dL (ref 70–320)

## 2021-12-21 LAB — PROTEIN ELECTROPHORESIS, SERUM
Albumin ELP: 4.3 g/dL (ref 3.8–4.8)
Alpha 1: 0.3 g/dL (ref 0.2–0.3)
Alpha 2: 0.7 g/dL (ref 0.5–0.9)
Beta 2: 0.6 g/dL — ABNORMAL HIGH (ref 0.2–0.5)
Beta Globulin: 0.5 g/dL (ref 0.4–0.6)
Gamma Globulin: 1.3 g/dL (ref 0.8–1.7)
Total Protein: 7.7 g/dL (ref 6.1–8.1)

## 2021-12-21 NOTE — Progress Notes (Unsigned)
FOLLOW UP Date of Service/Encounter:  12/22/21   Subjective:  Nicole Rasmussen (DOB: 25-Jul-1960) is a 61 y.o. female who returns to the [place] on 12/22/2021 in re-evaluation of the following: food allergy, allergic rhinitis, asthma, rash History obtained from: chart review and patient.  For Review, LV was on 11/30/21  with Dr. Edison Pace seen for acute visit for rash    Hx for Review: " Reports using a new mango scented body wash yesterday morning and afternoon.  She developed development of urticaria yesterday afternoon after eating chicken and pasta salad approximately a few hours afterwards.  She self treated with benadryl and claritin and hydrocortisone cream with no response.  She did eat some shrimp on Sunday around 6pm.  She previously tolerated shrimp and crab, but has a history of urticaria with lobster. " Labs for shellfish panel: 11/30/21: negative  Her initial visit with Dr. Neldon Mc was 0n 10/05/21 seen at that time for allergic rhinitis-allergy testing deferred due to recent antihistamine use.    Today presents for follow-up. Here to undergo allergy testing. She developed hives around 3 to 4 weeks ago.  She has had instant hives with lobster. Shortly after using a new shower gel with mango and shea butter developed hives immediately afterward at night. She had eaten a lot of shellfish within the 48 proceeding the development hives, but none immediately before. She has used her shower gel but she has used it since without symptoms. She has had recurrent hives in the past-typically to shellfish.  She did complete at least 5, less than 10 years of AIT.  Last injection was in early 82s. She is interested in restarting for control of rhinitis/conjunctivitis symptoms.  Allergies as of 12/22/2021       Reactions   Shellfish Allergy Hives, Swelling   Latex Hives   hives   Other Other (See Comments)   Sulfonamide Derivatives Other (See Comments)   Rash,  itching        Medication List        Accurate as of December 22, 2021 11:55 AM. If you have any questions, ask your nurse or doctor.          albuterol 108 (90 Base) MCG/ACT inhaler Commonly known as: VENTOLIN HFA Inhale 2 puffs into the lungs every 4 (four) hours as needed for wheezing or shortness of breath.   aspirin EC 81 MG tablet Take 81 mg by mouth daily.   atorvastatin 10 MG tablet Commonly known as: LIPITOR Take 10 mg by mouth daily.   cyclobenzaprine 10 MG tablet Commonly known as: FLEXERIL Take 10 mg by mouth 3 (three) times daily as needed for muscle spasms.   diclofenac 75 MG EC tablet Commonly known as: VOLTAREN Take 75 mg by mouth 2 (two) times daily.   EPINEPHrine 0.3 mg/0.3 mL Soaj injection Commonly known as: EpiPen 2-Pak Inject 0.3 mg into the muscle as needed for anaphylaxis.   gabapentin 400 MG capsule Commonly known as: NEURONTIN Take 1 capsule by mouth 3 (three) times daily as needed.   loratadine 10 MG tablet Commonly known as: Claritin Take 1 tablet (10 mg total) by mouth 2 (two) times daily.   potassium chloride 10 MEQ CR capsule Commonly known as: MICRO-K Take 1 capsule by mouth 2 (two) times daily.   Ryaltris 462-70 MCG/ACT Susp Generic drug: Olopatadine-Mometasone Place 2 sprays into the nose 2 (two) times daily.       Past Medical History:  Diagnosis Date  Acid reflux    Allergy    Anemia    Arthritis    Asthma    no inhalers   B12 deficiency    Dysmetabolic syndrome X    Dyspnea    Food allergy    shellfish   GERD (gastroesophageal reflux disease)    HLD (hyperlipidemia)    Joint pain    knees and back   Lower back pain    Nerve pain    top of right thigh   Neuromuscular disorder (HCC)    bilateral leg pain from back issue   Prediabetes    Shortness of breath    Sinus problem    Swallowing difficulty    Vitamin B 12 deficiency    Vitamin D deficiency    Past Surgical History:  Procedure Laterality  Date   CESAREAN SECTION     1 time   COLONOSCOPY     ESOPHAGOGASTRODUODENOSCOPY     FOOT SURGERY Bilateral 1980's   bunionectomy and hammer toe on both feet   MOUTH SURGERY  2011   had implants/ root canal/bridges   WISDOM TOOTH EXTRACTION     with the implants   Otherwise, there have been no changes to her past medical history, surgical history, family history, or social history.  ROS: All others negative except as noted per HPI.   Objective:  BP 126/84   Pulse 74   Temp 97.6 F (36.4 C)   Resp 16   Ht '5\' 8"'$  (1.727 m)   Wt 271 lb 9.6 oz (123.2 kg)   LMP 10/11/2010   SpO2 97%   BMI 41.30 kg/m  Body mass index is 41.3 kg/m. Physical Exam: General Appearance:  Alert, cooperative, no distress, appears stated age  Head:  Normocephalic, without obvious abnormality, atraumatic  Eyes:  Conjunctiva clear, EOM's intact  Nose: Nares normal, hypertrophic turbinates, normal mucosa, no visible anterior polyps, and septum midline  Throat: Lips, tongue normal; teeth and gums normal, normal posterior oropharynx  Neck: Supple, symmetrical  Lungs:   clear to auscultation bilaterally, Respirations unlabored, no coughing  Heart:  regular rate and rhythm and no murmur, Appears well perfused  Extremities: No edema  Skin: Skin color, texture, turgor normal, no rashes or lesions on visualized portions of skin  Neurologic: No gross deficits   Skin Testing: Environmental allergy panel and select foods. Adequate positive and negative controls Results discussed with patient/family.  Airborne Adult Perc - 12/22/21 1002     Time Antigen Placed 1002    Allergen Manufacturer Greer    Location Back    Number of Test 59    2. Control-Histamine 1 mg/ml 3+    4. Brumley Negative    5. [place] 3+    6. Johnson Negative    7. Sarles Blue Negative    8. Meadow Fescue Negative    9. Perennial Rye Negative    10. Sweet Vernal Negative    11. Timothy Negative    12. Cocklebur Negative    13.  Burweed Marshelder Negative    14. Ragweed, short Negative    15. Ragweed, Giant 2+    16. Plantain,  English Negative    17. Lamb's Quarters Negative    18. Sheep Sorrell 2+    19. Rough Pigweed 2+    20. Marsh Elder, Rough Negative    21. Mugwort, Common Negative    22. Ash mix Negative    23. Birch mix Negative    24. Marathon Oil  Negative    25. Box, Elder Negative    26. Cedar, red 2+    27. Cottonwood, [place] Negative    28. Elm mix 2+    29. Hickory Negative    30. Maple mix Negative    31. Oak, [place] mix Negative    32. Pecan Pollen Negative    33. Pine mix Negative    34. Sycamore Eastern Negative    35. Glenwood, Black Pollen Negative    36. Alternaria alternata Negative    37. Cladosporium Herbarum Negative    38. Aspergillus mix Negative    39. Penicillium mix Negative    40. Bipolaris sorokiniana (Helminthosporium) Negative    41. Drechslera spicifera (Curvularia) 2+    42. Mucor plumbeus Negative    43. Fusarium moniliforme Negative    44. Aureobasidium pullulans (pullulara) Negative    45. Rhizopus oryzae Negative    46. Botrytis cinera Negative    47. Epicoccum nigrum 2+    48. Phoma betae Negative    49. Candida Albicans Negative    50. Trichophyton mentagrophytes Negative    51. Mite, D Farinae  5,000 AU/ml Negative    52. Mite, D Pteronyssinus  5,000 AU/ml Negative    53. Cat Hair 10,000 BAU/ml Negative    54.  Dog Epithelia Negative    55. Mixed Feathers Negative    56. Horse Epithelia Negative    57. Cockroach, German 2+    58. Mouse Negative    59. Tobacco Leaf Negative             Food Perc - 12/22/21 1003       Test Information   Time Antigen Placed 1003    Allergen Manufacturer Lavella Hammock    Location Back    Number of allergen test 10      Food   1. Peanut Negative    2. Soybean food Negative    3. Wheat, whole Negative    4. Sesame Negative    5. Milk, cow Negative    6. Egg White, chicken Negative    7. Casein Negative     8. Shellfish mix Negative    9. Fish mix Negative    10. Cashew Negative             Intradermal - 12/22/21 1035     Time Antigen Placed 1035    Allergen Manufacturer Lavella Hammock    Location Arm    Number of Test 8    Intradermal Select    Control Negative    Johnson Negative    7 Grass Negative    Mold 1 Negative    Mold 2 Negative    Cat Negative    Dog Negative    Mite mix 2+             Food Adult Perc - 12/22/21 1000     Time Antigen Placed 1003    Allergen Manufacturer Greer    Location Back    Number of allergen test 5    25. Shrimp Negative    26. Crab Negative    27. Lobster Negative    28. Oyster Negative    29. Scallops Negative             Allergy testing results were read and interpreted by myself, documented by clinical staff.  Assessment/Plan  Food allergy testing today including to shellfish was negative. It is unclear what may have caused her hives the other day, but given  2 x negative shellfish challenge and delayed symptoms, offered shellfish challenge if she is interested.  Regarding environmental allergies, she is interested in allergy injections and we discussed RUSH vs traditional.  She is interested in [place] and will discuss coverage with her insurance carrier and let us know which route she decides to pursue.    1.  For urticaria - unclear cause of hives, if hives return:  A. OTC loratadine '10mg'$  twice a day until hives resolve B. For now, Avoid all shellfish, schedule a shellfish challenge at your convenience based on negative labs and skin testing  2. Treat and prevent inflammation:  A. Ryaltris - 2 sprays each nostril 1-2 times per day B. Allergen avoidance-grass pollen, borderline to weed and tree pollen, minor indoor/outdoor molds, dust mites C. Allergy injections? RUSH (day in [facility], then weekly in [place]) vs traditional (weekly in [employer])  3. If needed:  A. Albuterol HFA - 2 inhalations every 4-6 hours B. Epi-Pen,  benadryl, MD/ER evaluation for allergic reaction  Follow-up in 4 months, sooner if needed.   Thank you so much for letting me partake in your care today.  Don't hesitate to reach out if you have any additional concerns!  Sigurd Sos, MD  [facility]

## 2021-12-22 ENCOUNTER — Encounter: Payer: Self-pay | Admitting: Internal Medicine

## 2021-12-22 ENCOUNTER — Other Ambulatory Visit: Payer: Self-pay

## 2021-12-22 ENCOUNTER — Ambulatory Visit: Payer: 59 | Admitting: Internal Medicine

## 2021-12-22 ENCOUNTER — Encounter: Payer: Self-pay | Admitting: Neurology

## 2021-12-22 VITALS — BP 126/84 | HR 74 | Temp 97.6°F | Resp 16 | Ht 68.0 in | Wt 271.6 lb

## 2021-12-22 DIAGNOSIS — L500 Allergic urticaria: Secondary | ICD-10-CM | POA: Diagnosis not present

## 2021-12-22 DIAGNOSIS — J3089 Other allergic rhinitis: Secondary | ICD-10-CM | POA: Diagnosis not present

## 2021-12-22 DIAGNOSIS — J302 Other seasonal allergic rhinitis: Secondary | ICD-10-CM

## 2021-12-22 MED ORDER — LORATADINE 10 MG PO TABS: 10.0000 mg | ORAL_TABLET | Freq: Two times a day (BID) | ORAL | 5 refills | Status: DC

## 2021-12-22 MED ORDER — EPINEPHRINE 0.3 MG/0.3ML IJ SOAJ: 0.3000 mg | INTRAMUSCULAR | 1 refills | Status: AC | PRN

## 2021-12-22 MED ORDER — RYALTRIS 665-25 MCG/ACT NA SUSP: 2.0000 | Freq: Two times a day (BID) | NASAL | 5 refills | Status: DC

## 2021-12-22 MED ORDER — ALBUTEROL SULFATE HFA 108 (90 BASE) MCG/ACT IN AERS: 2.0000 | INHALATION_SPRAY | RESPIRATORY_TRACT | 1 refills | Status: DC | PRN

## 2021-12-22 NOTE — Addendum Note (Signed)
Addended by: Larence Penning on: 12/22/2021 04:50 PM   Modules accepted: Orders

## 2021-12-22 NOTE — Patient Instructions (Addendum)
  1.  For urticaria - unclear cause of hives, if hives return:  A. OTC loratadine '10mg'$  twice a day until hives resolve B. For now, Avoid all shellfish, schedule a shellfish challenge at your convenience based on negative labs and skin testing  2. Treat and prevent inflammation:  A. Ryaltris - 2 sprays each nostril 1-2 times per day B. Allergen avoidance-grass pollen, borderline to weed and tree pollen, minor indoor/outdoor molds, dust mites C. Allergy injections? RUSH (day in [facility], then weekly in [place]) vs traditional (weekly in [employer])  3. If needed:  A. Albuterol HFA - 2 inhalations every 4-6 hours B. Epi-Pen, benadryl, MD/ER evaluation for allergic reaction  Follow-up in 4 months, sooner if needed.   Thank you so much for letting me partake in your care today.  Don't hesitate to reach out if you have any additional concerns!  Sigurd Sos, MD [facility]

## 2021-12-23 ENCOUNTER — Ambulatory Visit: Payer: 59 | Admitting: Nurse Practitioner

## 2021-12-23 ENCOUNTER — Encounter: Payer: Self-pay | Admitting: Nurse Practitioner

## 2021-12-23 DIAGNOSIS — R911 Solitary pulmonary nodule: Secondary | ICD-10-CM | POA: Diagnosis not present

## 2021-12-23 DIAGNOSIS — M549 Dorsalgia, unspecified: Secondary | ICD-10-CM | POA: Insufficient documentation

## 2021-12-23 DIAGNOSIS — M546 Pain in thoracic spine: Secondary | ICD-10-CM | POA: Diagnosis not present

## 2021-12-23 DIAGNOSIS — G8929 Other chronic pain: Secondary | ICD-10-CM

## 2021-12-23 NOTE — Assessment & Plan Note (Signed)
She has been struggling with back pain that radiates to her neck and sides for the past 6 months. She has also had vision changes, dry eyes, fatigue, and balance issues. Currently being evaluated by neurology. She is not aware that she has undergone any autoimmune testing. She does not have any respiratory concerns/complaints. I recommended that if her CT chest is unrevealing and they are still left with little answers, she should undergo autoimmune workup with her PCP and possible referral to rheumatology. We will await her CT scan results and determine next steps.

## 2021-12-23 NOTE — Assessment & Plan Note (Addendum)
Stable nodule in RUL from 2019-2022. Initially recommended 5 years of stability on 2019 imaging. She had CT chest performed at outside facility; unfortunately we do not have these records available today to review. She thought they had said the nodule was larger, but she also recalled mention of it being 41m, which would be stable. [facility] advised that [facility] would be faxing them over. Discussed this with the patient, who was understandably frustrated. Assured her that I would call her as soon as I receive them so we can review them.  Patient Instructions  Continue Albuterol inhaler 2 puffs every 6 hours as needed for shortness of breath or wheezing. Notify if symptoms persist despite rescue inhaler/neb use.  We are waiting to get CT scan results. I will call you as soon as I have these and discuss next steps if we need to do anything differently.  Follow up in one year with Dr. OAnder Sladeor as needed

## 2021-12-23 NOTE — Patient Instructions (Signed)
Continue Albuterol inhaler 2 puffs every 6 hours as needed for shortness of breath or wheezing. Notify if symptoms persist despite rescue inhaler/neb use.  We are waiting to get CT scan results. I will call you as soon as I have these and discuss next steps if we need to do anything differently.  Follow up in one year with Dr. Ander Slade or as needed

## 2021-12-23 NOTE — Progress Notes (Addendum)
$'@Patient'g$  ID: Nicole Rasmussen, female    DOB: [birth year], 61 y.o.   MRN: [id number]  Chief Complaint  Patient presents with   Follow-up    No new issues with breathing just in some pain.    Referring provider: Glendon Axe, MD  HPI: 61 year old female, former smoker followed for DOE and pulmonary nodule. She is a patient of Dr. Judson Roch and last seen in office on 09/24/2020. Past medical history significant for recurrent sinusitis, GERD, OA, chronic neck and back pain, obesity, prediabetes, asthma. She is followed by Dr. Simona Huh with asthma/allergy.  TEST/EVENTS:  11/11/2017 CTA chest: no evidence of PE. Nodular ground-glass opacity in the RUL, measures 9 mm.  11/14/2018 PET: RUL 0.9 cm nodule has no significant metabolism.Two inguinal nodes, not hypermetabolic, but needs follow up  04/25/2019 CT chest/abd/pelvis: Stable 9 mm nodule in the right upper lobe.  Lungs are otherwise clear.  No LAD 09/10/2019 CT chest wo contrast: there is a stable 9x8 mm nodular opacity in the RUL. There is a 1 mm nodular opacity in the apical segment RUL. Stable scarring in the RUL. Mild atelectatic change in the medial right base.  11/04/2020 CT chest wo contrast: 7 mm (5x34m 3/33) nodule in the apical segment RUL, unchanged. Peribronchovascular scarring in the RUL. Subpleural scarring in the superior segment LLL. Lungs otherwise clear. The liver is slightly irregular, possibly suggestive of cirrhosis   09/24/2020: OV with Dr. OAnder Slade CT chest from 2019 showed abnormal 9 mm nodule; recommended repeat imaging to show 5 years of stability. CT's so far stable. Plan to repeat in one year. PFTs ordered for evaluation of dyspnea. Encouraged to continue working on weight loss measures. Recommended f/u in 6 weeks.   12/23/2021: Today - follow up Patient presents today for overdue follow up and to discuss recent CT chest results. She has been dealing with chronic pain for the last six months. She has pain on both of her  sides/mid back that is sharp. She has also had some vision changes and dry eyes, which her eye doctor has not been able to find any structural eye problems to the symptoms to. She's also had some balance issues; feels like she loses her balance easily. She has been seen by neurology as well. CT [facility] and there was no acute lung problem to correlate her pain to. [place], we have yet to receive these. From a respiratory standpoint, she has no complaints. Her breathing is stable and she hasn't had any trouble with shortness of breath. She denies cough, wheezing, chest congestion, hemoptysis, orthopnea, PND, leg swelling. She does have some joint pain in her right knee, which she has been told she has little to no cartilage and was diagnosed with arthritis. Otherwise, no joint pain/swelling, no skin lesions, no dry mouth. She rarely requires her albuterol inhaler.   Allergies  Allergen Reactions   Shellfish Allergy Hives and Swelling   Latex Hives    hives   Sulfonamide Derivatives Other (See Comments)    Rash, itching    Immunization History  Administered Date(s) Administered   Influenza-Unspecified 02/07/2017, 01/21/2019   PFIZER(Purple Top)SARS-COV-2 Vaccination 07/11/2019    Past Medical History:  Diagnosis Date   Acid reflux    Allergy    Anemia    Arthritis    Asthma    no inhalers  B12 deficiency    Dysmetabolic syndrome X    Dyspnea    Food allergy    shellfish   GERD (gastroesophageal reflux disease)    HLD (hyperlipidemia)    Joint pain    knees and back   Lower back pain    Nerve pain    top of right thigh   Neuromuscular disorder (HCC)    bilateral leg pain from back issue   Prediabetes    Shortness of breath    Sinus problem    Swallowing difficulty    Vitamin B 12 deficiency    Vitamin D deficiency      Tobacco History: Social History   Tobacco Use  Smoking Status Former   Packs/day: 1.00   Years: 20.00   Total pack years: 20.00   Types: Cigarettes   Quit date: 05/02/1996   Years since quitting: 25.6   Passive exposure: Never  Smokeless Tobacco Never   Counseling given: Not Answered   Outpatient Medications Prior to Visit  Medication Sig Dispense Refill   albuterol (VENTOLIN HFA) 108 (90 Base) MCG/ACT inhaler Inhale 2 puffs into the lungs every 4 (four) hours as needed for wheezing or shortness of breath. 18 g 1   aspirin EC 81 MG tablet Take 81 mg by mouth daily.     atorvastatin (LIPITOR) 10 MG tablet Take 10 mg by mouth daily.     cyclobenzaprine (FLEXERIL) 10 MG tablet Take 10 mg by mouth 3 (three) times daily as needed for muscle spasms.     diclofenac (VOLTAREN) 75 MG EC tablet Take 75 mg by mouth 2 (two) times daily.     EPINEPHrine (EPIPEN 2-PAK) 0.3 mg/0.3 mL IJ SOAJ injection Inject 0.3 mg into the muscle as needed for anaphylaxis. 2 each 1   gabapentin (NEURONTIN) 400 MG capsule Take 1 capsule by mouth 3 (three) times daily as needed.     loratadine (CLARITIN) 10 MG tablet Take 1 tablet (10 mg total) by mouth 2 (two) times daily. 60 tablet 5   Olopatadine-Mometasone (RYALTRIS) 665-25 MCG/ACT SUSP Place 2 sprays into the nose 2 (two) times daily. 29 g 5   potassium chloride (MICRO-K) 10 MEQ CR capsule Take 1 capsule by mouth 2 (two) times daily.     No facility-administered medications prior to visit.     Review of Systems:   Constitutional: No weight loss or gain, night sweats, fevers, chills, or lassitude. +fatigue  HEENT: No headaches, difficulty swallowing, tooth/dental problems, or sore throat. No sneezing, itching, ear ache, nasal congestion, or post nasal drip. +blurred vision, dry eyes CV:  No chest pain, orthopnea, PND, swelling in lower extremities, anasarca, dizziness, palpitations, syncope Resp: No shortness of breath with exertion or at rest. No  excess mucus or change in color of mucus. No productive or non-productive. No hemoptysis. No wheezing.  No chest wall deformity GI:  No heartburn, indigestion, abdominal pain, nausea, vomiting, diarrhea, change in bowel habits, loss of appetite, bloody stools.  GU: No dysuria, change in color of urine, urgency or frequency.  No flank pain, no hematuria  Skin: No rash, lesions, ulcerations MSK:  No joint pain or swelling.  No decreased range of motion.  +neck and back pain. Neuro: No dizziness or lightheadedness. +balance issues Psych: No depression or anxiety. Mood stable.     Physical Exam:  BP 122/84 (BP Location: Right Arm, Patient Position: Sitting, Cuff Size: Large)   Pulse 88   Temp 98.5 F (36.9 C) (Oral)  Ht '5\' 8"'$  (1.727 m)   Wt 269 lb 6.4 oz (122.2 kg)   LMP 10/11/2010   SpO2 98%   BMI 40.96 kg/m   GEN: Pleasant, interactive, well-appearing; obese; in no acute distress. HEENT:  Normocephalic and atraumatic. PERRLA. Sclera white. Nasal turbinates pink, moist and patent bilaterally. No rhinorrhea present. Oropharynx pink and moist, without exudate or edema. No lesions, ulcerations, or postnasal drip.  NECK:  Supple w/ fair ROM. No JVD present. Normal carotid impulses w/o bruits. Thyroid symmetrical with no goiter or nodules palpated. No lymphadenopathy.   CV: RRR, no m/r/g, no peripheral edema. Pulses intact, +2 bilaterally. No cyanosis, pallor or clubbing. PULMONARY:  Unlabored, regular breathing. Clear bilaterally A&P w/o wheezes/rales/rhonchi. No accessory muscle use. No dullness to percussion. GI: BS present and normoactive. Soft, non-tender to palpation. No organomegaly or masses detected. No CVA tenderness. MSK: No erythema, warmth or tenderness. Cap refil <2 sec all extrem. No deformities or joint swelling noted.  Neuro: A/Ox3. No focal deficits noted.   Skin: Warm, no lesions or rashe Psych: Normal affect and behavior. Judgement and thought content appropriate.      Lab Results:  CBC    Component Value Date/Time   WBC 6.6 06/16/2021 1510   RBC 4.69 06/16/2021 1510   HGB 12.6 06/16/2021 1510   HGB 13.3 02/09/2017 1250   HCT 39.0 06/16/2021 1510   HCT 42.3 02/09/2017 1250   PLT 160.0 06/16/2021 1510   MCV 83.3 06/16/2021 1510   MCV 85 02/09/2017 1250   MCH 26.8 03/01/2021 1742   MCHC 32.3 06/16/2021 1510   RDW 14.6 06/16/2021 1510   RDW 14.7 02/09/2017 1250   LYMPHSABS 2.4 06/16/2021 1510   LYMPHSABS 2.9 02/09/2017 1250   MONOABS 0.6 06/16/2021 1510   EOSABS 0.2 06/16/2021 1510   EOSABS 0.1 02/09/2017 1250   BASOSABS 0.0 06/16/2021 1510   BASOSABS 0.0 02/09/2017 1250    BMET    Component Value Date/Time   NA 139 03/01/2021 1742   NA 139 02/09/2017 1250   K 3.5 03/01/2021 1742   CL 104 03/01/2021 1742   CO2 26 03/01/2021 1742   GLUCOSE 99 03/01/2021 1742   BUN 22 03/01/2021 1742   BUN 19 08/20/2019 0000   CREATININE 0.99 03/01/2021 1742   CALCIUM 9.3 03/01/2021 1742   GFRNONAA >60 03/01/2021 1742   GFRAA 77 08/20/2019 0000    BNP No results found for: "BNP"   Imaging:  No results found.  methylPREDNISolone acetate (DEPO-MEDROL) injection 80 mg     Date Action Dose Route User   11/30/2021 1209 Given 80 mg Intramuscular (Left Upper Outer Quadrant) Morehead, Michelle B, CMA           No data to display          No results found for: "NITRICOXIDE"      Assessment & Plan:   Pulmonary nodule Stable nodule in RUL from 2019-2022. Initially recommended 5 years of stability on 2019 imaging. She had CT chest performed at outside facility; unfortunately we do not have these records available today to review. She thought they had said the nodule was larger, but she also recalled mention of it being 48m, which would be stable[facility] advised that [facility] would be faxing them over. Discussed this with the patient, who was understandably frustrated. Assured  her that I would call her as soon as I receive them so we can review them.  Patient Instructions  Continue Albuterol inhaler 2 puffs every 6 hours as needed for shortness of breath or wheezing. Notify if symptoms persist despite rescue inhaler/neb use.  We are waiting to get CT scan results. I will call you as soon as I have these and discuss next steps if we need to do anything differently.  Follow up in one year with Dr. Ander Slade or as needed     Back pain She has been struggling with back pain that radiates to her neck and sides for the past 6 months. She has also had vision changes, dry eyes, fatigue, and balance issues. Currently being evaluated by neurology. She is not aware that she has undergone any autoimmune testing. She does not have any respiratory concerns/complaints. I recommended that if her CT chest is unrevealing and they are still left with little answers, she should undergo autoimmune workup with her PCP and possible referral to rheumatology. We will await her CT scan results and determine next steps.   I spent 35 minutes of dedicated to the care of this patient on the date of this encounter to include pre-visit review of records, face-to-face time with the patient discussing conditions above, post visit ordering of testing, clinical documentation with the electronic health record, making appropriate referrals as documented, and communicating necessary findings to members of the patients care team.  Clayton Bibles, NP 12/23/2021  Pt aware and understands NP's role.

## 2021-12-24 ENCOUNTER — Telehealth: Payer: Self-pay | Admitting: Nurse Practitioner

## 2021-12-27 NOTE — Telephone Encounter (Signed)
I called Nicole Rasmussen at [facility] and she is going to send over a fax of the CT scan for this patient.

## 2021-12-28 ENCOUNTER — Encounter: Payer: Self-pay | Admitting: Nurse Practitioner

## 2021-12-28 NOTE — Addendum Note (Signed)
Addended by: Clayton Bibles on: 12/28/2021 09:56 AM   Modules accepted: Orders

## 2021-12-28 NOTE — Progress Notes (Signed)
Addendum 12/28/2021: CT chest with contrast results received from outside facility. No images available. Read interprets the RUL nodule as stable at 54m. This nodule was previously without any hypermetabolism on PET imaging from 2020. It has been stable since 2019. Initial recommendation was for 5 years of stability. Assured the patient that this nodule appears benign. She was told by her PCP that it may need to be biopsied so she was concerned about this. Educated her that it has been stable for 4 years and she is a never smoker so biopsy is not indicated. She verbalized understanding.

## 2021-12-29 NOTE — Telephone Encounter (Signed)
Nicole Rasmussen was calling the patient and giving her results. Nothing further needed.

## 2022-01-06 ENCOUNTER — Ambulatory Visit: Payer: 59 | Attending: Neurology | Admitting: Physical Therapy

## 2022-01-06 ENCOUNTER — Encounter: Payer: Self-pay | Admitting: Physical Therapy

## 2022-01-06 DIAGNOSIS — R252 Cramp and spasm: Secondary | ICD-10-CM | POA: Diagnosis present

## 2022-01-06 DIAGNOSIS — M542 Cervicalgia: Secondary | ICD-10-CM | POA: Insufficient documentation

## 2022-01-06 DIAGNOSIS — M5412 Radiculopathy, cervical region: Secondary | ICD-10-CM | POA: Insufficient documentation

## 2022-01-06 DIAGNOSIS — R209 Unspecified disturbances of skin sensation: Secondary | ICD-10-CM | POA: Insufficient documentation

## 2022-01-06 DIAGNOSIS — R202 Paresthesia of skin: Secondary | ICD-10-CM | POA: Insufficient documentation

## 2022-01-06 DIAGNOSIS — R200 Anesthesia of skin: Secondary | ICD-10-CM | POA: Insufficient documentation

## 2022-01-06 NOTE — Therapy (Signed)
OUTPATIENT PHYSICAL THERAPY CERVICAL EVALUATION   Patient Name: Nicole Rasmussen MRN: [id number] DOB:[birth year], 61 y.o., female Today's Date: 01/06/2022   PT End of Session - 01/06/22 1803     Visit Number 1    Date for PT Re-Evaluation 04/07/22    Authorization Type [employer]    PT Start Time 1800    PT Stop Time 1845    PT Time Calculation (min) 45 min    Activity Tolerance Patient tolerated treatment well    Behavior During Therapy WFL for tasks assessed/performed             Past Medical History:  Diagnosis Date   Acid reflux    Allergy    Anemia    Arthritis    Asthma    no inhalers   B12 deficiency    Dysmetabolic syndrome X    Dyspnea    Food allergy    shellfish   GERD (gastroesophageal reflux disease)    HLD (hyperlipidemia)    Joint pain    knees and back   Lower back pain    Nerve pain    top of right thigh   Neuromuscular disorder (HCC)    bilateral leg pain from back issue   Prediabetes    Shortness of breath    Sinus problem    Swallowing difficulty    Vitamin B 12 deficiency    Vitamin D deficiency    Past Surgical History:  Procedure Laterality Date   CESAREAN SECTION     1 time   COLONOSCOPY     ESOPHAGOGASTRODUODENOSCOPY     FOOT SURGERY Bilateral 1980's   bunionectomy and hammer toe on both feet   MOUTH SURGERY  2011   had implants/ root canal/bridges   WISDOM TOOTH EXTRACTION     with the implants   Patient Active Problem List   Diagnosis Date Noted   Pulmonary nodule 12/23/2021   Back pain 12/23/2021   Seasonal and perennial allergic rhinitis 12/22/2021   Dysphagia 10/22/2020   Abdominal pain, epigastric 10/22/2020   Gastroesophageal reflux disease 10/22/2020   Osteoarthritis of right knee 03/16/2020   Prediabetes 08/26/2019   Class 3 severe obesity with serious comorbidity and body mass index (BMI) of 40.0 to 44.9 in adult [facility]) 07/11/2019   Recurrent sinusitis 10/19/2018   Other fatigue 02/09/2017   Shortness of breath  on exertion 02/09/2017   Hyperglycemia 02/09/2017   Vitamin D deficiency 02/09/2017   Dysesthesia 01/27/2016   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 01/27/2016   Lumbosacral radiculopathy at L5 01/27/2016   Headache 06/08/2013   Atypical chest pain 06/08/2013   HYPERCHOLESTEROLEMIA 09/17/2007   ARTHRITIS 09/17/2007   HEARTBURN 09/17/2007    PCP: Glendon Axe  REFERRING PROVIDER: Kai Levins  REFERRING DIAG: cervicalgia, radiculopathy  THERAPY DIAG:  Cervicalgia  Cramp and spasm  Radiculopathy, cervical region  Rationale for Evaluation and Treatment Rehabilitation  ONSET DATE: May 2023  SUBJECTIVE:  SUBJECTIVE STATEMENT: Patient reports that she has had left neck pain with numbness and tingling in the left arm mostly, she reports that she thinks it started in May and has gotten worse to the point she will have pain and issues daily  PERTINENT HISTORY:  See above  PAIN:  Are you having pain? Yes: NPRS scale: 4/10 Pain location: left cervical area Pain description: sharp, numbness and tingling down the arm Aggravating factors: movements of arms and head, sleeping 10/10 at worst Relieving factors: cream on shoulder gabapentin, at best pain a 3-4/10  PRECAUTIONS: None  WEIGHT BEARING RESTRICTIONS No  FALLS:  Has patient fallen in last 6 months? 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  PLOF: Independent and does her own housework  PATIENT GOALS have less pain numbness and tingling  OBJECTIVE:   DIAGNOSTIC FINDINGS:  Has  EMG coming up in October, has done a lot of other testing  COGNITION: Overall cognitive status: Within functional limits for tasks assessed   SENSATION: WFL C/o numbness and tingling in the left arm  POSTURE: rounded shoulders and forward head  PALPATION: Has a significant knot in the left upper trap, very tight in the neck, tender in theis area and seemed to cause some issues in the left UE Has significant divot wehre bra strap is  CERVICAL ROM:   Active ROM A/PROM (deg) eval  Flexion WNL  Extension Decreased 25% with pain  Right lateral flexion Decreased 50%  Left lateral flexion Decreased 50%  Right rotation Decreased 25%  Left rotation Decreased 25%   (Blank rows = not tested)  UPPER EXTREMITY ROM:  WFL's  UPPER EXTREMITY MMT: Good strength shoulders but did increase left arm pain and tingling  CERVICAL SPECIAL TESTS:  All cervical tests increased pain   TODAY'S TREATMENT:  MHP/IFC to the left C/T area in sitting x 10 minutes   PATIENT EDUCATION:  Education details: HEP Person educated: Patient Education method: Explanation, Demonstration, and Handouts Education comprehension: verbalized understanding   HOME EXERCISE PROGRAM: Access Code: [email or web address] URL: [email or web address] Date: 01/06/2022 Prepared by: Lum Babe  Exercises - Seated Shoulder Shrugs  - 2 x daily - 7 x weekly - 2 sets - 10 reps - 3 hold - Seated Scapular Retraction  - 2 x daily - 7 x weekly - 2 sets - 10 reps - 3 hold - Seated Cervical Retraction  - 2 x daily - 7 x weekly - 2 sets - 10 reps - 3 hold  ASSESSMENT:  CLINICAL IMPRESSION: Patient is a 61 y.o. female who was seen today for physical therapy evaluation and treatment for neck pain and left arm numbness and tingling, she reports worse over the last few months, she works in [employer] office and can stand or sit, but does have to pass a physical fitness test 2x/year.  She will be having EMG in the next  month..    OBJECTIVE IMPAIRMENTS decreased activity tolerance, decreased ROM, decreased strength, increased fascial restrictions, increased muscle spasms, improper body mechanics, postural dysfunction, and pain.   REHAB POTENTIAL: Good  CLINICAL DECISION MAKING: Stable/uncomplicated  EVALUATION COMPLEXITY: Low   GOALS: Goals reviewed with patient? Yes  SHORT TERM GOALS: Target date: 01/20/22  Independent with initial HEP Goal status: INITIAL  LONG TERM GOALS: Target date: 03/31/22  Understand posture and body mechanics Goal status: INITIAL  2.  Independent with advanced HEP Goal status: INITIAL  3.  Increase cervical ROM 25% Goal status: INITIAL  4.  Decrease tingling 25% Goal status: INITIAL   PLAN: PT FREQUENCY: 1-2x/week  PT DURATION: 12 weeks  PLANNED INTERVENTIONS: Therapeutic exercises, Therapeutic activity, Neuromuscular re-education, Balance training, Gait training, Patient/Family education, Self Care, Joint mobilization, Joint manipulation, Dry Needling, Electrical stimulation, Spinal mobilization, Cryotherapy, Moist heat, Taping, Traction, Ultrasound, and Manual therapy  PLAN FOR NEXT SESSION: slowly work on posture, strength and function, could try traction   Sumner Boast, PT 01/06/2022, 6:04 PM

## 2022-01-19 NOTE — Therapy (Signed)
OUTPATIENT PHYSICAL THERAPY CERVICAL EVALUATION   Patient Name: Nicole Rasmussen MRN: [id number] DOB:1960/12/08, 61 y.o., female Today's Date: 01/20/2022   PT End of Session - 01/20/22 1802     Visit Number 2    Date for PT Re-Evaluation 04/07/22    Authorization Type [employer]    PT Start Time 1800    PT Stop Time 1845    PT Time Calculation (min) 45 min    Activity Tolerance Patient tolerated treatment well    Behavior During Therapy WFL for tasks assessed/performed              Past Medical History:  Diagnosis Date   Acid reflux    Allergy    Anemia    Arthritis    Asthma    no inhalers   B12 deficiency    Dysmetabolic syndrome X    Dyspnea    Food allergy    shellfish   GERD (gastroesophageal reflux disease)    HLD (hyperlipidemia)    Joint pain    knees and back   Lower back pain    Nerve pain    top of right thigh   Neuromuscular disorder (HCC)    bilateral leg pain from back issue   Prediabetes    Shortness of breath    Sinus problem    Swallowing difficulty    Vitamin B 12 deficiency    Vitamin D deficiency    Past Surgical History:  Procedure Laterality Date   CESAREAN SECTION     1 time   COLONOSCOPY     ESOPHAGOGASTRODUODENOSCOPY     FOOT SURGERY Bilateral 1980's   bunionectomy and hammer toe on both feet   MOUTH SURGERY  2011   had implants/ root canal/bridges   WISDOM TOOTH EXTRACTION     with the implants   Patient Active Problem List   Diagnosis Date Noted   Pulmonary nodule 12/23/2021   Back pain 12/23/2021   Seasonal and perennial allergic rhinitis 12/22/2021   Dysphagia 10/22/2020   Abdominal pain, epigastric 10/22/2020   Gastroesophageal reflux disease 10/22/2020   Osteoarthritis of right knee 03/16/2020   Prediabetes 08/26/2019   Class 3 severe obesity with serious comorbidity and body mass index (BMI) of 40.0 to 44.9 in adult [facility]) 07/11/2019   Recurrent sinusitis 10/19/2018   Other fatigue 02/09/2017   Shortness of  breath on exertion 02/09/2017   Hyperglycemia 02/09/2017   Vitamin D deficiency 02/09/2017   Dysesthesia 01/27/2016   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   PERTINENT HISTORY:  See above  PAIN:  Are you having pain? Yes: NPRS scale: 5/10 Pain location: left cervical area Pain description: sharp, numbness and tingling down the arm Aggravating factors: movements of arms and head, sleeping 10/10 at worst Relieving factors: cream on shoulder gabapentin, at best pain a 3-4/10  PRECAUTIONS: None  WEIGHT BEARING RESTRICTIONS No  FALLS:  Has patient fallen in last 6 months? 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  PLOF: Independent and does her own housework  PATIENT GOALS have less pain numbness and tingling  OBJECTIVE:   DIAGNOSTIC FINDINGS:  Has EMG coming up in October, has done a lot of other testing  COGNITION: Overall cognitive status: Within functional  limits for tasks assessed   SENSATION: WFL C/o numbness and tingling in the left arm  POSTURE: rounded shoulders and forward head  PALPATION: Has a significant knot in the left upper trap, very tight in the neck, tender in theis area and seemed to cause some issues in the left UE Has significant divot wehre bra strap is  CERVICAL ROM:   Active ROM A/PROM (deg) eval  Flexion WNL  Extension Decreased 25% with pain  Right lateral flexion Decreased 50%  Left lateral flexion Decreased 50%  Right rotation Decreased 25%  Left rotation Decreased 25%   (Blank rows = not tested)  UPPER EXTREMITY ROM:  WFL's  UPPER EXTREMITY MMT: Good strength shoulders but did increase left arm pain and tingling  CERVICAL SPECIAL TESTS:  All cervical tests increased pain   TODAY'S TREATMENT:  01/20/22 UBE L1 x52mns  STM and UT stretch  Shoulder flexion 2# 2x10  Horizontal ABD yellow band x10, redTB x10 Shoulder row and ext greenTB x10 Cervical traction w/MH 15# 138ms   01/06/22 MHP/IFC to the left C/T area in sitting x 10 minutes   PATIENT EDUCATION:  Education details: HEP Person educated: Patient Education method: Explanation, Demonstration, and Handouts Education comprehension: verbalized understanding   HOME EXERCISE PROGRAM: Access Code: : [email or web address]: [email or web address] Date: 01/06/2022 Prepared by: MiLum BabeExercises - Seated Shoulder Shrugs  - 2 x daily - 7 x weekly - 2 sets - 10 reps - 3 hold - Seated Scapular Retraction  - 2 x daily - 7 x weekly - 2 sets - 10 reps - 3 hold - Seated Cervical Retraction  - 2 x daily - 7 x weekly - 2 sets - 10 reps - 3 hold  ASSESSMENT:  CLINICAL IMPRESSION: Patient continues to have neck pain and numbness and tingling into her hands. She complains of numbness with exercises, we tried traction today to c-spine to help decompress some nerves.    OBJECTIVE IMPAIRMENTS decreased activity tolerance, decreased ROM,  decreased strength, increased fascial restrictions, increased muscle spasms, improper body mechanics, postural dysfunction, and pain.   REHAB POTENTIAL: Good  CLINICAL DECISION MAKING: Stable/uncomplicated  EVALUATION COMPLEXITY: Low   GOALS: Goals reviewed with patient? Yes  SHORT TERM GOALS: Target date: 01/20/22  Independent with initial HEP Goal status: INITIAL  LONG TERM GOALS: Target date: 03/31/22  Understand posture and body mechanics Goal status: INITIAL  2.  Independent with advanced HEP Goal status: INITIAL  3.  Increase cervical ROM 25% Goal status: INITIAL  4.  Decrease tingling 25% Goal status: INITIAL   PLAN: PT FREQUENCY: 1-2x/week  PT DURATION: 12 weeks  PLANNED INTERVENTIONS: Therapeutic exercises, Therapeutic activity, Neuromuscular re-education, Balance training, Gait training, Patient/Family education, Self Care, Joint mobilization, Joint manipulation, Dry Needling, Electrical stimulation, Spinal mobilization, Cryotherapy, Moist heat, Taping, Traction, Ultrasound, and Manual therapy  PLAN FOR NEXT SESSION: slowly work on posture, strength and function, assess traction, try some manual traction    Andris Baumann, PT 01/20/2022, 6:46 PM

## 2022-01-20 ENCOUNTER — Ambulatory Visit: Payer: 59

## 2022-01-20 DIAGNOSIS — M5412 Radiculopathy, cervical region: Secondary | ICD-10-CM

## 2022-01-20 DIAGNOSIS — M542 Cervicalgia: Secondary | ICD-10-CM | POA: Diagnosis not present

## 2022-01-20 DIAGNOSIS — R252 Cramp and spasm: Secondary | ICD-10-CM

## 2022-01-24 ENCOUNTER — Ambulatory Visit: Payer: 59 | Admitting: Physical Therapy

## 2022-01-24 ENCOUNTER — Encounter: Payer: Self-pay | Admitting: Physical Therapy

## 2022-01-24 DIAGNOSIS — M542 Cervicalgia: Secondary | ICD-10-CM

## 2022-01-24 DIAGNOSIS — R252 Cramp and spasm: Secondary | ICD-10-CM

## 2022-01-24 DIAGNOSIS — M5412 Radiculopathy, cervical region: Secondary | ICD-10-CM

## 2022-01-24 NOTE — Therapy (Signed)
OUTPATIENT PHYSICAL THERAPY CERVICAL EVALUATION   Patient Name: Nicole Rasmussen MRN: [id number] DOB:[birth year], 61 y.o., female Today's Date: 01/24/2022   PT End of Session - 01/24/22 1800     Visit Number 3    Date for PT Re-Evaluation 04/07/22    PT Start Time 8144    PT Stop Time 1840    PT Time Calculation (min) 42 min    Activity Tolerance Patient tolerated treatment well    Behavior During Therapy WFL for tasks assessed/performed               Past Medical History:  Diagnosis Date   Acid reflux    Allergy    Anemia    Arthritis    Asthma    no inhalers   B12 deficiency    Dysmetabolic syndrome X    Dyspnea    Food allergy    shellfish   GERD (gastroesophageal reflux disease)    HLD (hyperlipidemia)    Joint pain    knees and back   Lower back pain    Nerve pain    top of right thigh   Neuromuscular disorder (HCC)    bilateral leg pain from back issue   Prediabetes    Shortness of breath    Sinus problem    Swallowing difficulty    Vitamin B 12 deficiency    Vitamin D deficiency    Past Surgical History:  Procedure Laterality Date   CESAREAN SECTION     1 time   COLONOSCOPY     ESOPHAGOGASTRODUODENOSCOPY     FOOT SURGERY Bilateral 1980's   bunionectomy and hammer toe on both feet   MOUTH SURGERY  2011   had implants/ root canal/bridges   WISDOM TOOTH EXTRACTION     with the implants   Patient Active Problem List   Diagnosis Date Noted   Pulmonary nodule 12/23/2021   Back pain 12/23/2021   Seasonal and perennial allergic rhinitis 12/22/2021   Dysphagia 10/22/2020   Abdominal pain, epigastric 10/22/2020   Gastroesophageal reflux disease 10/22/2020   Osteoarthritis of right knee 03/16/2020   Prediabetes 08/26/2019   Class 3 severe obesity with serious comorbidity and body mass index (BMI) of 40.0 to 44.9 in adult [facility]) 07/11/2019   Recurrent sinusitis 10/19/2018   Other fatigue 02/09/2017   Shortness of breath on exertion 02/09/2017    Hyperglycemia 02/09/2017   Vitamin D deficiency 02/09/2017   Dysesthesia 01/27/2016   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 B shoulders  and into L arm. The traction felt good, but did not last. Has her EMG on Oct 2  PERTINENT HISTORY:  See above  PAIN:  Are you having pain? Yes: NPRS scale: 5/10 Pain location: left cervical area Pain description: sharp, numbness and tingling down the arm Aggravating factors: movements of arms and head, sleeping 10/10 at worst Relieving factors: cream on shoulder gabapentin, at best pain a 3-4/10  PRECAUTIONS: None  WEIGHT BEARING RESTRICTIONS No  FALLS:  Has patient fallen in last 6 months? 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  PLOF: Independent and does her own housework  PATIENT GOALS have less pain numbness and tingling  OBJECTIVE:   DIAGNOSTIC FINDINGS:  Has EMG coming up in October, has done a lot of other testing  COGNITION: Overall cognitive status: Within  functional limits for tasks assessed   SENSATION: WFL C/o numbness and tingling in the left arm  POSTURE: rounded shoulders and forward head  PALPATION: Has a significant knot in the left upper trap, very tight in the neck, tender in theis area and seemed to cause some issues in the left UE Has significant divot wehre bra strap is  CERVICAL ROM:   Active ROM A/PROM (deg) eval  Flexion WNL  Extension Decreased 25% with pain  Right lateral flexion Decreased 50%  Left lateral flexion Decreased 50%  Right rotation Decreased 25%  Left rotation Decreased 25%   (Blank rows = not tested)  UPPER EXTREMITY ROM:  WFL's  UPPER EXTREMITY MMT: Good strength shoulders but did increase left arm pain and tingling  CERVICAL SPECIAL TESTS:  All cervical tests increased pain   TODAY'S TREATMENT:  01/24/22 UBE 2 min forward and reported pain, 3 min back, L1 Standing shoulder rows, ext ER against G Tband. 2 x 10 reps, reported numbness in L hand B pects tightness noted Doorway stretch at 90 and 120 degrees, 1 x 30 sec each  UE neural tension stretch in all 3 positions. Paitent noted tingling returned in LUE during shoulder IR and extension. DN performed by Roma Schanz, PT MH to neck and shoulders x 10 minutes   01/20/22 UBE L1 x35mns  STM and UT stretch  Shoulder flexion 2# 2x10  Horizontal ABD yellow band x10, redTB x10 Shoulder row and ext greenTB x10 Cervical traction w/MH 15# 119ms   01/06/22 MHP/IFC to the left C/T area in sitting x 10 minutes   PATIENT EDUCATION:  Education details: HEP Person educated: Patient Education method: Explanation, Demonstration, and Handouts Education comprehension: verbalized understanding   HOME EXERCISE PROGRAM: Access Code: : [email or web address]: [email or web address] Date: 01/06/2022 Prepared by: MiLum BabeExercises - Seated Shoulder Shrugs  - 2 x daily - 7 x weekly - 2 sets - 10 reps - 3 hold - Seated Scapular  Retraction  - 2 x daily - 7 x weekly - 2 sets - 10 reps - 3 hold - Seated Cervical Retraction  - 2 x daily - 7 x weekly - 2 sets - 10 reps - 3 hold  ASSESSMENT:  CLINICAL IMPRESSION: Patient reports increased pain and tingling today. Treatment focused on postural strengthening, nerve glides, DN. Patient noted increased tingling with L shoulder IR and ext. She reported imporved pain and tingling after  treatment completed.   OBJECTIVE IMPAIRMENTS decreased activity tolerance, decreased ROM, decreased strength, increased fascial restrictions, increased muscle spasms, improper body mechanics, postural dysfunction, and pain.   REHAB POTENTIAL: Good  CLINICAL DECISION MAKING: Stable/uncomplicated  EVALUATION COMPLEXITY: Low   GOALS: Goals reviewed with patient? Yes  SHORT TERM GOALS: Target date: 01/20/22  Independent with initial HEP Goal status: ongoing  LONG TERM GOALS: Target date: 03/31/22  Understand posture and body mechanics Goal status: INITIAL  2.  Independent with advanced HEP Goal status: INITIAL  3.  Increase cervical ROM 25% Goal status: INITIAL  4.  Decrease tingling 25% Goal status: INITIAL   PLAN: PT FREQUENCY: 1-2x/week  PT DURATION: 12 weeks  PLANNED INTERVENTIONS: Therapeutic exercises, Therapeutic activity, Neuromuscular re-education, Balance training, Gait training, Patient/Family education, Self Care, Joint mobilization, Joint manipulation, Dry Needling, Electrical stimulation, Spinal mobilization, Cryotherapy, Moist heat, Taping, Traction, Ultrasound, and Manual therapy  PLAN FOR NEXT SESSION: slowly work on posture, strength and function, assess traction, try some manual traction    Marcelina Morel, DPT 01/24/2022, 6:50 PM

## 2022-01-25 ENCOUNTER — Ambulatory Visit: Payer: 59 | Admitting: Physical Therapy

## 2022-01-31 ENCOUNTER — Ambulatory Visit (INDEPENDENT_AMBULATORY_CARE_PROVIDER_SITE_OTHER): Payer: 59 | Admitting: Neurology

## 2022-01-31 ENCOUNTER — Telehealth: Payer: Self-pay | Admitting: Neurology

## 2022-01-31 DIAGNOSIS — G629 Polyneuropathy, unspecified: Secondary | ICD-10-CM

## 2022-01-31 DIAGNOSIS — R209 Unspecified disturbances of skin sensation: Secondary | ICD-10-CM

## 2022-01-31 DIAGNOSIS — R200 Anesthesia of skin: Secondary | ICD-10-CM

## 2022-01-31 NOTE — Procedures (Signed)
[facility]  [address]  [place], [place] [address] Tel: [phone] Fax: [phone] Test Date:  01/31/2022  Patient: Nicole Rasmussen DOB: June 17, 1960 Physician: Kai Levins, MD  Sex: Female Height: '5\' 8"'$  Ref Phys: Kai Levins, MD  ID#: [id number]   Technician:    History: This is a 61 year old female with numbness and tingling in left arm.  NCV & EMG Findings: Extensive electrodiagnostic evaluation of the left upper limb with additional nerve conduction studies of the right upper limb shows: Absent median and ulnar sensory responses bilaterally. The left radial sensory response is within normal limits. Bilateral medial antebrachial cutaneous sensory responses are absent and bilateral lateral antebrachial cutaneous sensory response show reduced amplitude (L8, R8 V). Left median (APB) and left ulnar (ADM) motor responses are within normal limits. There is no evidence of active or chronic motor axon loss changes affecting any of the tested muscles. Motor unit configuration and recruitment pattern is within normal limits.  Impression: This is an abnormal study of the left upper limb. 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  The absent bilateral medial antebrachial cutaneous sensory responses and low amplitude bilateral lateral antebrachial cutaneous sensory response are likely technical in nature and not due to bilateral plexopathy given the normal needle examination.  There is no evidence of a left cervical (C5-T1) motor radiculopathy.  There is no definitive evidence of a left median mononeuropathy at or distal to the wrist (ie no evidence of left carpal tunnel syndrome).    ___________________________ Kai Levins, MD    Nerve  Conduction Studies Motor Nerve Results    Latency Amplitude F-Lat Segment Distance CV Comment  Site (ms) Norm (mV) Norm (ms)  (cm) (m/s) Norm   Left Median (APB) Motor  Wrist 3.7  < 4.0 10.6  > 5.0        Elbow 8.0 - 10.0 -  Elbow-Wrist 30 70  > 50   Left Ulnar (ADM) Motor  Wrist 2.3  < 3.1 9.7  > 7.0        Bel elbow 6.4 - 9.0 -  Bel elbow-Wrist 27 66  > 50   Ab elbow 8.0 - 8.1 -  Ab elbow-Bel elbow 10 63 -    Sensory Sites    Neg Peak Lat Amplitude (O-P) Segment Distance Velocity Comment  Site (ms) Norm (V) Norm  (cm) (ms)   Left Lateral Antebrachial Cutaneous Sensory  Lat biceps-Lat forearm 2.4  < 2.8 *8  > 10 Lat biceps-Lat forearm 12    Right Lateral Antebrachial Cutaneous Sensory  Lat biceps-Lat forearm 2.5  < 2.8 *8  > 10 Lat biceps-Lat forearm 12    Left Medial Antebrachial Cutaneous Sensory  Elbow-Med forearm *NR  < 3.2 *NR  > 5 Elbow-Med forearm 12    Right Medial Antebrachial Cutaneous Sensory  Elbow-Med forearm *NR  < 3.2 *NR  > 5 Elbow-Med forearm 12    Left Median Sensory  Wrist-Dig II *NR  < 3.8 *NR  > 10 Wrist-Dig II 13    Right Median Sensory  Wrist-Dig II *NR  < 3.8 *NR  > 10 Wrist-Dig II 13    Left Radial Sensory  Forearm-Wrist 2.3  < 2.8 19  > 10 Forearm-Wrist 10    Left Ulnar Sensory  Wrist-Dig  V *NR  < 3.2 *NR  > 5 Wrist-Dig V 11    Right Ulnar Sensory  Wrist *NR  < 3.2 *NR  > 5 Wrist-Dig V 11     Electromyography   Side Muscle Ins.Act Fibs Fasc Recrt Amp Dur Poly Activation Comment  Left FDI Nml Nml Nml Nml Nml Nml Nml Nml N/A  Left EIP Nml Nml Nml Nml Nml Nml Nml Nml N/A  Left Pronator teres Nml Nml Nml Nml Nml Nml Nml Nml N/A  Left APB Nml Nml Nml Nml Nml Nml Nml Nml N/A  Left Biceps Nml Nml Nml Nml Nml Nml Nml Nml N/A  Left Triceps Nml Nml Nml Nml Nml Nml Nml Nml N/A  Left Deltoid Nml Nml Nml Nml Nml Nml Nml Nml N/A      Waveforms:  Motor      Sensory

## 2022-01-31 NOTE — Telephone Encounter (Signed)
Discussed the results of her EMG after the procedure today. Her results were consistent with the upper limb manifestations of a peripheral neuropathy with no evidence of left radiculopathy.  All questions were answered.  Kai Levins, MD [facility]

## 2022-02-04 ENCOUNTER — Encounter: Payer: Self-pay | Admitting: Physical Therapy

## 2022-02-04 ENCOUNTER — Ambulatory Visit: Payer: 59 | Attending: Neurology | Admitting: Physical Therapy

## 2022-02-04 DIAGNOSIS — M542 Cervicalgia: Secondary | ICD-10-CM | POA: Diagnosis present

## 2022-02-04 DIAGNOSIS — R252 Cramp and spasm: Secondary | ICD-10-CM | POA: Diagnosis present

## 2022-02-04 DIAGNOSIS — M5412 Radiculopathy, cervical region: Secondary | ICD-10-CM | POA: Diagnosis present

## 2022-02-04 NOTE — Therapy (Signed)
OUTPATIENT PHYSICAL THERAPY CERVICAL EVALUATION   Patient Name: Nicole Rasmussen MRN: [id number] DOB:05-15-1960, 61 y.o., female Today's Date: 02/04/2022   PT End of Session - 02/04/22 0944     Visit Number 4    Date for PT Re-Evaluation 04/07/22    PT Start Time 0935    PT Stop Time 1013    PT Time Calculation (min) 38 min    Activity Tolerance Patient tolerated treatment well    Behavior During Therapy WFL for tasks assessed/performed                Past Medical History:  Diagnosis Date   Acid reflux    Allergy    Anemia    Arthritis    Asthma    no inhalers   B12 deficiency    Dysmetabolic syndrome X    Dyspnea    Food allergy    shellfish   GERD (gastroesophageal reflux disease)    HLD (hyperlipidemia)    Joint pain    knees and back   Lower back pain    Nerve pain    top of right thigh   Neuromuscular disorder (HCC)    bilateral leg pain from back issue   Prediabetes    Shortness of breath    Sinus problem    Swallowing difficulty    Vitamin B 12 deficiency    Vitamin D deficiency    Past Surgical History:  Procedure Laterality Date   CESAREAN SECTION     1 time   COLONOSCOPY     ESOPHAGOGASTRODUODENOSCOPY     FOOT SURGERY Bilateral 1980's   bunionectomy and hammer toe on both feet   MOUTH SURGERY  2011   had implants/ root canal/bridges   WISDOM TOOTH EXTRACTION     with the implants   Patient Active Problem List   Diagnosis Date Noted   Pulmonary nodule 12/23/2021   Back pain 12/23/2021   Seasonal and perennial allergic rhinitis 12/22/2021   Dysphagia 10/22/2020   Abdominal pain, epigastric 10/22/2020   Gastroesophageal reflux disease 10/22/2020   Osteoarthritis of right knee 03/16/2020   Prediabetes 08/26/2019   Class 3 severe obesity with serious comorbidity and body mass index (BMI) of 40.0 to 44.9 in adult [facility]) 07/11/2019   Recurrent sinusitis 10/19/2018   Other fatigue 02/09/2017   Shortness of breath on exertion  02/09/2017   Hyperglycemia 02/09/2017   Vitamin D deficiency 02/09/2017   Dysesthesia 01/27/2016   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 B shoulders  and into L arm. The traction felt good, but did not last. Has her EMG on Oct 2  PERTINENT HISTORY:  See above  PAIN:  Are you having pain? Yes: NPRS scale: 5/10 Pain location: left cervical area Pain description: sharp, numbness and tingling down the arm Aggravating factors: movements of arms and head, sleeping 10/10 at worst Relieving factors: cream on shoulder gabapentin, at best pain a 3-4/10  PRECAUTIONS: None  WEIGHT BEARING RESTRICTIONS No  FALLS:  Has patient fallen in last 6 months? 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 The left radial sensory response is within normal limits. Bilateral medial antebrachial cutaneous sensory responses are absent and bilateral lateral antebrachial cutaneous sensory response show reduced amplitude (L8, R8 V). Left median (APB) and left ulnar (ADM) motor responses are within normal limits. There is no evidence of active or chronic motor axon loss changes affecting any of the tested muscles. Motor unit configuration and recruitment pattern is within normal limits.   Impression: This is an abnormal study of the left upper limb. 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  The absent bilateral medial antebrachial cutaneous sensory responses and low amplitude bilateral lateral antebrachial cutaneous sensory response are likely technical in nature and not due to bilateral plexopathy given the normal needle examination.  There is no evidence of a left cervical (C5-T1) motor radiculopathy.  There is no definitive evidence of a left median mononeuropathy at or distal to the wrist (ie no evidence of left carpal tunnel syndrome).  COGNITION: Overall cognitive status: Within functional limits for tasks assessed   SENSATION: WFL C/o numbness and tingling in the left arm  POSTURE: rounded shoulders and forward head  PALPATION: Has a significant knot in the left upper trap, very tight in the neck, tender in theis area and seemed to cause some issues in the left UE Has significant divot wehre bra strap is  CERVICAL ROM:   Active ROM A/PROM (deg) eval  Flexion WNL  Extension Decreased 25% with pain  Right lateral flexion Decreased 50%  Left  lateral flexion Decreased 50%  Right rotation Decreased 25%  Left rotation Decreased 25%   (Blank rows = not tested)  UPPER EXTREMITY ROM:  WFL's  UPPER EXTREMITY MMT: Good strength shoulders but did increase left arm pain and tingling  CERVICAL SPECIAL TESTS:  All cervical tests increased pain   TODAY'S TREATMENT:  02/04/22 UBE L1, 2 min for/3 min back Practiced extensive program for stretching including supine thoracic stretch on towel, seated pect minor and chest/bicep stretch, 3 way doorway stretch Shoulder ext, rows, ER against G tband, caused the tingling and pain, so limited repetitions. Seated chair push ups, emphasizing final ext to engage lats. She reported that  this caused some pain, but not as much as the other exercises.  01/24/22 UBE 2 min forward and reported pain, 3 min back, L1 Standing shoulder rows, ext ER against G Tband. 2 x 10 reps, reported numbness in L hand B pects tightness noted Doorway stretch at 90 and 120 degrees, 1 x 30 sec each  UE neural tension stretch in all 3 positions. Paitent noted tingling returned in LUE during shoulder IR and extension. DN performed by Roma Schanz, PT MH to neck and shoulders x 10 minutes   01/20/22 UBE L1 x1mns  STM and UT stretch  Shoulder flexion 2# 2x10  Horizontal ABD yellow band x10, redTB x10 Shoulder row and ext greenTB x10 Cervical traction w/MH 15# 115ms   01/06/22 MHP/IFC to the left C/T area in sitting x 10 minutes   PATIENT EDUCATION:  Education details: HEP Person educated: Patient Education method: Explanation, Demonstration, and Handouts Education comprehension: verbalized understanding   HOME EXERCISE PROGRAM: Access Code: : [email or web address]: [email or web address] Date: 01/06/2022 Prepared by: MiLum BabeASSESSMENT:  CLINICAL IMPRESSION: Patient had her EMG study which showed B Distal Symmetrical Polyneuropathy. No cervical radiculopathy noted. See results under  Diagnostic Testing. She is taking Gabapentin up to 3 times per day, but tries to not use it during the day due to sleepiness. Treatment focused on stretching to anterior chest muscles, updating HEP. Initiated postural strengthening, but this caused immediate pain and tingling in arms. Patient encouraged to consistently perform the stretching and try to modify the strengthening into smaller ranges in order to try not to stretch the chest and trigger the pain/tingling, report back at next visit. Also plan to attempt some neural tension/glides to see if this helps.   OBJECTIVE IMPAIRMENTS decreased activity tolerance, decreased ROM, decreased strength, increased fascial restrictions, increased muscle spasms, improper body mechanics, postural dysfunction, and pain.   REHAB POTENTIAL: Good  CLINICAL DECISION MAKING: Stable/uncomplicated  EVALUATION COMPLEXITY: Low   GOALS: Goals reviewed with patient? Yes  SHORT TERM GOALS: Target date: 01/20/22  Independent with initial HEP Goal status: ongoing  LONG TERM GOALS: Target date: 03/31/22  Understand posture and body mechanics Goal status: INITIAL  2.  Independent with advanced HEP Goal status: INITIAL  3.  Increase cervical ROM 25% Goal status: INITIAL  4.  Decrease tingling 25% Goal status: INITIAL   PLAN: PT FREQUENCY: 1-2x/week  PT DURATION: 12 weeks  PLANNED INTERVENTIONS: Therapeutic exercises, Therapeutic activity, Neuromuscular re-education, Balance training, Gait training, Patient/Family education, Self Care, Joint mobilization, Joint manipulation, Dry Needling, Electrical stimulation, Spinal mobilization, Cryotherapy, Moist heat, Taping, Traction, Ultrasound, and Manual therapy  PLAN FOR NEXT SESSION: Assess HEP, neural floss/tensioning  SuMarcelina MorelDPT 02/04/2022, 11:08 AM

## 2022-02-08 ENCOUNTER — Ambulatory Visit: Payer: 59

## 2022-02-14 ENCOUNTER — Encounter: Payer: Self-pay | Admitting: Physical Therapy

## 2022-02-14 ENCOUNTER — Ambulatory Visit: Payer: 59 | Admitting: Physical Therapy

## 2022-02-14 DIAGNOSIS — M542 Cervicalgia: Secondary | ICD-10-CM | POA: Diagnosis not present

## 2022-02-14 DIAGNOSIS — R252 Cramp and spasm: Secondary | ICD-10-CM

## 2022-02-14 DIAGNOSIS — M5412 Radiculopathy, cervical region: Secondary | ICD-10-CM

## 2022-02-14 NOTE — Therapy (Signed)
OUTPATIENT PHYSICAL THERAPY CERVICAL EVALUATION   Patient Name: Nicole Rasmussen MRN: [id number] DOB:1960-09-25, 61 y.o., female Today's Date: 02/14/2022        Past Medical History:  Diagnosis Date   Acid reflux    Allergy    Anemia    Arthritis    Asthma    no inhalers   B12 deficiency    Dysmetabolic syndrome X    Dyspnea    Food allergy    shellfish   GERD (gastroesophageal reflux disease)    HLD (hyperlipidemia)    Joint pain    knees and back   Lower back pain    Nerve pain    top of right thigh   Neuromuscular disorder (HCC)    bilateral leg pain from back issue   Prediabetes    Shortness of breath    Sinus problem    Swallowing difficulty    Vitamin B 12 deficiency    Vitamin D deficiency    Past Surgical History:  Procedure Laterality Date   CESAREAN SECTION     1 time   COLONOSCOPY     ESOPHAGOGASTRODUODENOSCOPY     FOOT SURGERY Bilateral 1980's   bunionectomy and hammer toe on both feet   MOUTH SURGERY  2011   had implants/ root canal/bridges   WISDOM TOOTH EXTRACTION     with the implants   Patient Active Problem List   Diagnosis Date Noted   Pulmonary nodule 12/23/2021   Back pain 12/23/2021   Seasonal and perennial allergic rhinitis 12/22/2021   Dysphagia 10/22/2020   Abdominal pain, epigastric 10/22/2020   Gastroesophageal reflux disease 10/22/2020   Osteoarthritis of right knee 03/16/2020   Prediabetes 08/26/2019   Class 3 severe obesity with serious comorbidity and body mass index (BMI) of 40.0 to 44.9 in adult [facility]) 07/11/2019   Recurrent sinusitis 10/19/2018   Other fatigue 02/09/2017   Shortness of breath on exertion 02/09/2017   Hyperglycemia 02/09/2017   Vitamin D deficiency 02/09/2017   Dysesthesia 01/27/2016   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 01/27/2016   Lumbosacral radiculopathy at L5 01/27/2016   Headache 06/08/2013   Atypical chest pain 06/08/2013   HYPERCHOLESTEROLEMIA  09/17/2007   ARTHRITIS 09/17/2007   HEARTBURN 09/17/2007    PCP: Glendon Axe  REFERRING PROVIDER: Kai Levins  REFERRING DIAG: cervicalgia, radiculopathy  THERAPY DIAG:  No diagnosis found.  Rationale for Evaluation and Treatment Rehabilitation  ONSET DATE: May 2023  SUBJECTIVE:  SUBJECTIVE STATEMENT: Patient reports good days and bad days.She still has tingling and pain with any lifting. Lying on her L shoulder as that can aggravate the tingling. She bought a TNS unit that does seem to help the pain.  PERTINENT HISTORY:  See above  PAIN:  Are you having pain? Yes: NPRS scale: 5/10 Pain location: left cervical area Pain description: sharp, numbness and tingling down the arm Aggravating factors: movements of arms and head, sleeping 10/10 at worst Relieving factors: cream on shoulder gabapentin, at best pain a 3-4/10  PRECAUTIONS: None  WEIGHT BEARING RESTRICTIONS No  FALLS:  Has patient fallen in last 6 months? 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 The left radial sensory response is within normal limits. Bilateral medial antebrachial cutaneous sensory responses are absent and bilateral  lateral antebrachial cutaneous sensory response show reduced amplitude (L8, R8 V). Left median (APB) and left ulnar (ADM) motor responses are within normal limits. There is no evidence of active or chronic motor axon loss changes affecting any of the tested muscles. Motor unit configuration and recruitment pattern is within normal limits.   Impression: This is an abnormal study of the left upper limb. 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  The absent bilateral medial antebrachial cutaneous sensory responses and low amplitude bilateral lateral antebrachial cutaneous sensory response are likely technical in nature and not due to bilateral plexopathy given the normal needle examination.  There is no evidence of a left cervical (C5-T1) motor radiculopathy.  There is no definitive evidence of a left median mononeuropathy at or distal to the wrist (ie no evidence of left carpal tunnel syndrome).  COGNITION: Overall cognitive status: Within functional limits for tasks assessed   SENSATION: WFL C/o numbness and tingling in the left arm  POSTURE: rounded shoulders and forward head  PALPATION: Has a significant knot in the left upper trap, very tight in the neck, tender in theis area and seemed to cause some issues in the left UE Has significant divot wehre bra strap is  CERVICAL ROM:   Active ROM A/PROM (deg) eval  Flexion WNL  Extension Decreased 25% with pain  Right lateral flexion Decreased 50%  Left lateral flexion Decreased 50%  Right rotation Decreased 25%  Left rotation Decreased 25%   (Blank rows = not tested)  UPPER EXTREMITY ROM:  WFL's  UPPER EXTREMITY MMT: Good strength shoulders but did increase left arm pain and tingling  CERVICAL SPECIAL  TESTS:  All cervical tests increased pain   TODAY'S TREATMENT:  02/14/22 UBE L1, 3 min forward/3 min back STM to B cerv paraspinals with distraction, STM and stretch to L SCM, Upper traps, LS, pects, first rib mobilizations UE nerve glides x 3 MH to neck and upper shoulders. X 10 minutes   02/04/22  UBE L1, 2 min for/3 min back Practiced extensive program for stretching including supine thoracic stretch on towel, seated pect minor and chest/bicep stretch, 3 way doorway stretch Shoulder ext, rows, ER against G tband, caused the tingling and pain, so limited repetitions. Seated chair push ups, emphasizing final ext to engage lats. She reported that this caused some pain, but not as much as the other exercises.  01/24/22 UBE 2 min forward and reported pain, 3 min back, L1 Standing shoulder rows, ext ER against G Tband. 2 x 10 reps, reported numbness in L hand B pects tightness noted Doorway stretch at 90 and 120 degrees, 1 x 30 sec each  UE neural tension stretch in all 3 positions. Paitent noted tingling returned in LUE during shoulder IR and extension. DN performed by Roma Schanz, PT MH to neck and shoulders x 10 minutes   01/20/22 UBE L1 x11mns  STM and UT stretch  Shoulder flexion 2# 2x10  Horizontal ABD yellow band x10, redTB x10 Shoulder row and ext greenTB x10 Cervical traction w/MH 15# 17ms   01/06/22 MHP/IFC to the left C/T area in sitting x 10 minutes   PATIENT EDUCATION:  Education details: HEP Person educated: Patient Education method: Explanation, Demonstration, and Handouts Education comprehension: verbalized understanding   HOME EXERCISE PROGRAM: Access Code: : [email or web address]: [email or web address] Date: 01/06/2022 Prepared by: MiLum BabeASSESSMENT:  CLINICAL IMPRESSION: Patient reports continued tingling and numbness in L hand with any lifting. Therapist performed STM and stretch to neck. Treatment to SCM caused tingling as did  pects stretch. Her entire L upper quadrant is tight. She also noted tingling with first rib mobilizations. Given written instructions for nerve glides after practicing each.   OBJECTIVE IMPAIRMENTS decreased activity tolerance, decreased ROM, decreased strength, increased fascial restrictions, increased muscle spasms, improper body mechanics, postural dysfunction, and pain.   REHAB POTENTIAL: Good  CLINICAL DECISION MAKING: Stable/uncomplicated  EVALUATION COMPLEXITY: Low   GOALS: Goals reviewed with patient? Yes  SHORT TERM GOALS: Target date: 01/20/22  Independent with initial HEP Goal status: ongoing  LONG TERM GOALS: Target date: 03/31/22  Understand posture and body mechanics Goal status: ongoing  2.  Independent with advanced HEP Goal status: INITIAL  3.  Increase cervical ROM 25% Goal status: INITIAL  4.  Decrease tingling 25% Goal status: ongoing   PLAN: PT FREQUENCY: 1-2x/week  PT DURATION: 12 weeks  PLANNED INTERVENTIONS: Therapeutic exercises, Therapeutic activity, Neuromuscular re-education, Balance training, Gait training, Patient/Family education, Self Care, Joint mobilization, Joint manipulation, Dry Needling, Electrical stimulation, Spinal mobilization, Cryotherapy, Moist heat, Taping, Traction, Ultrasound, and Manual therapy  PLAN FOR NEXT SESSION: Assess HEP, update HEP tin include up traps, LS, SCM stretches  SuMarcelina MorelDPT 02/14/2022, 5:58 PM

## 2022-02-16 ENCOUNTER — Ambulatory Visit: Payer: 59 | Admitting: Physical Therapy

## 2022-02-21 ENCOUNTER — Ambulatory Visit: Payer: 59 | Admitting: Physical Therapy

## 2022-02-23 ENCOUNTER — Ambulatory Visit: Payer: 59 | Admitting: Physical Therapy

## 2022-03-01 ENCOUNTER — Ambulatory Visit: Payer: 59

## 2022-03-03 ENCOUNTER — Ambulatory Visit: Payer: 59 | Admitting: Physical Therapy

## 2022-03-29 NOTE — Progress Notes (Deleted)
I saw Soundra Lampley in neurology clinic on 03/31/22 in follow up for neuropathy.  HPI: Rhilee Currin is a 61 y.o. year old female with a history of B12 deficiency, OA c/b joint pain and back pain, GERD, HLD, prediabetes, vit D deficiency who we last saw on 12/17/21.  To briefly review: Patient is having tingling in her left neck, arm, and leg. When she has back pain, it is also on the left. The tingling in the arms started in the last 6 months. It was coming and going until about 2 months ago when it became constant. It is a shock like sensation. It radiates from her neck into all of her fingers. Bending her neck (like when shampooing at the hair salon) makes the sensations worse. The pain is particularly uncomfortable at night and can wake her up. She can straighten her arm and it will help sometimes. She sleeps on her side, but tries to avoid her left side. She often has her arms curled up. The pain can be 10/10, but averages 6/10. It occasionally can complete go away. She occasionally gets similar symptoms on the right but very rare. She denies weakness. She does have pain in her right knee that makes walking difficult at times.    She takes gabapentin 400 mg QID (given by PCP). It helps knock down the pain along with diclofenac, flexeril, and tylenol. She has previously tried PT (many years ago) for her back and this did not help much.   She also endorses tingling in both feet (maybe right more than left). She does not currently have this tingling though.   Patient was seen by Dr. Carlean Purl in GI for abdominal pain of unknown origin. The clinic note from 09/17/21 was reviewed. Patient had previously been seen by [facility], but wanted second opinion for her pain, so consult to [facility] neurology was placed.    Patient previously saw Dr. Rexene Alberts at [facility] (last seen 05/06/20). The clinic notes indicate patient had burning/tingling sensations in her right thigh. She also  described radiating pain from lower back and a long standing history of low back pain. Per 12/15/15 office note:  "she had a lumbar spine MRI without contrast at [facility] on 09/27/2013: Depression: This is an abnormal MRI of the lumbar spine showing degenerative changes at L3-L4, L4-L5, and L5-S1 as detailed above. The post significant findings are at L5-S1 were severe facet hypertrophy causes moderate left foraminal narrowing that could lead to dynamic impingement of the left L5 nerve root." Patient had EMG on 01/27/16. Per clinic note on 04/18/16, the impression was:  This NCV/EMG study showed the following: 1.    No evidence of polyneuropathy.    2.    Mild chronic right L5 radiculopathy without active features. 3.    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   MRI of lumbar spine showed degenerative changes so patient was referred to spine. She saw Dr. Erlinda Hong in othropedics. The note from 10/10/20 indicates she was seen for low back pain. Patient was referred to PT, given diclofenac, and recommended to lose weight.   EtOH use: Socially, beer or wine (1-2 per day; 4 times per week)  Restrictive diet? No Family history of neuropathy/myopathy/NM disease? None  12/17/21 Assessment: Overall, neck and left arm symptoms are likely related and perhaps secondary to cervical radiculopathy. A focal neuropathy such as ulnar or median neuropathy is also possible, but less likely. Her numbness and tingling in  feet could be due to prior lumbar radiculopathy vs peripheral neuropathy (maybe due to prediabetes).   PLAN: -Blood work: B12, SPEP, IFE to evaluate for treatable causes of neuropathy -EMG for left upper extremity -Consult to physical therapy for neck and left arm pain -Continue gabapentin 400 mg QID, flexeril, diclofenac, and tylenol as needed for pain. Can consider increasing gabapentin if not getting symptom relief.  Since their last visit: Labs showed a  borderline low B12 level (284). EMG of the LUE was consistent with upper limb manifestations of PN.  She has been going to PT. *** ***  ROS: Pertinent positive and negative systems reviewed in HPI. ***   MEDICATIONS:  Outpatient Encounter Medications as of 03/31/2022  Medication Sig   albuterol (VENTOLIN HFA) 108 (90 Base) MCG/ACT inhaler Inhale 2 puffs into the lungs every 4 (four) hours as needed for wheezing or shortness of breath.   aspirin EC 81 MG tablet Take 81 mg by mouth daily.   atorvastatin (LIPITOR) 10 MG tablet Take 10 mg by mouth daily.   cyclobenzaprine (FLEXERIL) 10 MG tablet Take 10 mg by mouth 3 (three) times daily as needed for muscle spasms.   diclofenac (VOLTAREN) 75 MG EC tablet Take 75 mg by mouth 2 (two) times daily.   EPINEPHrine (EPIPEN 2-PAK) 0.3 mg/0.3 mL IJ SOAJ injection Inject 0.3 mg into the muscle as needed for anaphylaxis.   gabapentin (NEURONTIN) 400 MG capsule Take 1 capsule by mouth 3 (three) times daily as needed.   loratadine (CLARITIN) 10 MG tablet Take 1 tablet (10 mg total) by mouth 2 (two) times daily.   Olopatadine-Mometasone (RYALTRIS) [id number] MCG/ACT SUSP Place 2 sprays into the nose 2 (two) times daily.   potassium chloride (MICRO-K) 10 MEQ CR capsule Take 1 capsule by mouth 2 (two) times daily.   No facility-administered encounter medications on file as of 03/31/2022.    PAST MEDICAL HISTORY: Past Medical History:  Diagnosis Date   Acid reflux    Allergy    Anemia    Arthritis    Asthma    no inhalers   B12 deficiency    Dysmetabolic syndrome X    Dyspnea    Food allergy    shellfish   GERD (gastroesophageal reflux disease)    HLD (hyperlipidemia)    Joint pain    knees and back   Lower back pain    Nerve pain    top of right thigh   Neuromuscular disorder (HCC)    bilateral leg pain from back issue   Prediabetes    Shortness of breath    Sinus problem    Swallowing difficulty    Vitamin B 12 deficiency    Vitamin D  deficiency     PAST SURGICAL HISTORY: Past Surgical History:  Procedure Laterality Date   CESAREAN SECTION     1 time   COLONOSCOPY     ESOPHAGOGASTRODUODENOSCOPY     FOOT SURGERY Bilateral 1980's   bunionectomy and hammer toe on both feet   MOUTH SURGERY  2011   had implants/ root canal/bridges   WISDOM TOOTH EXTRACTION     with the implants    ALLERGIES: Allergies  Allergen Reactions   Shellfish Allergy Hives and Swelling   Latex Hives    hives   Sulfonamide Derivatives Other (See Comments)    Rash, itching    FAMILY HISTORY: Family History  Problem Relation Age of Onset   Cancer Mother    Hypertension Mother  Hyperlipidemia Mother    Heart disease Mother    Breast cancer Mother    Cancer Father    Kidney disease Father    Prostate cancer Father    Hyperlipidemia Sister    Diabetes Sister    Hypertension Sister    Hypertension Sister    Hypertension Sister    Hypertension Sister    Hyperlipidemia Brother    Hypertension Brother    Prostate cancer Brother    Hypertension Brother    Hypertension Brother    Hypertension Brother    Colon cancer Neg Hx    Esophageal cancer Neg Hx    Pancreatic cancer Neg Hx    Stomach cancer Neg Hx     SOCIAL HISTORY: Social History   Tobacco Use   Smoking status: Former    Packs/day: 1.00    Years: 20.00    Total pack years: 20.00    Types: Cigarettes    Quit date: 05/02/1996    Years since quitting: 25.9    Passive exposure: Never   Smokeless tobacco: Never  Vaping Use   Vaping Use: Never used  Substance Use Topics   Alcohol use: Yes    Comment: social   Drug use: No   Social History   Social History Narrative   Occasional caffeine use    Right handed    Live in a one level home    Objective:  Vital Signs:  LMP 10/11/2010   General:*** General appearance: Awake and alert. No distress. Cooperative with exam. Skin: No rash or jaundice. HEENT: Atraumatic. Anicteric. Lungs: Non-labored breathing  on room air  Heart: Regular Abdomen: Soft, non tender. Extremities: No edema. No obvious deformity.  Musculoskeletal: No obvious joint swelling.  Neurological: Mental Status: Alert. Speech fluent. No pseudobulbar affect Cranial Nerves: CNII: No RAPD. Visual fields intact. CNIII, IV, VI: PERRL. No nystagmus. EOMI. CN V: Facial sensation intact bilaterally to fine touch. [place] clench strong. Jaw jerk***. CN VII: Facial muscles symmetric and strong. No ptosis at rest or after sustained upgaze***. CN VIII: Hears finger rub well bilaterally. CN IX: No hypophonia. CN X: Palate elevates symmetrically. CN XI: Full strength shoulder shrug bilaterally. CN XII: Tongue protrusion full and midline. No atrophy or fasciculations. No significant dysarthria*** Motor: Tone is ***. *** fasciculations in *** extremities. *** atrophy. No grip or percussive myotonia.  Individual muscle group testing (MRC grade out of 5):  Movement     Neck flexion ***    Neck extension ***     Right Left   Shoulder abduction *** ***   Shoulder adduction *** ***   Shoulder ext rotation *** ***   Shoulder int rotation *** ***   Elbow flexion *** ***   Elbow extension *** ***   Wrist extension *** ***   Wrist flexion *** ***   Finger abduction - FDI *** ***   Finger abduction - ADM *** ***   Finger extension *** ***   Finger distal flexion - 2/3 *** ***   Finger distal flexion - 4/5 *** ***   Thumb flexion - FPL *** ***   Thumb abduction - APB *** ***    Hip flexion *** ***   Hip extension *** ***   Hip adduction *** ***   Hip abduction *** ***   Knee extension *** ***   Knee flexion *** ***   Dorsiflexion *** ***   Plantarflexion *** ***   Inversion *** ***   Eversion *** ***   Great  toe extension *** ***   Great toe flexion *** ***     Reflexes:  Right Left  Bicep *** ***  Tricep *** ***  BrRad *** ***  Knee *** ***  Ankle *** ***   Pathological Reflexes: Babinski: *** response  bilaterally*** Hoffman: *** Troemner: *** Pectoral: *** Palmomental: *** Facial: *** Midline tap: *** Sensation: Pinprick: *** Vibration: *** Temperature: *** Proprioception: *** Coordination: Intact finger-to- nose-finger and heel-to-shin bilaterally. Romberg negative.*** Gait: Able to rise from chair with arms crossed unassisted. Normal, narrow-based gait. Able to tandem walk. Able to walk on toes and heels.***   Lab and Test Review: ***  ASSESSMENT: This is Solana Coggin, a 61 y.o. female with:  ***  Plan: ***  Return to clinic in ***  Total time spent reviewing records, interview, history/exam, documentation, and coordination of care on day of encounter:  *** min  Kai Levins, MD

## 2022-03-31 ENCOUNTER — Ambulatory Visit: Payer: 59 | Admitting: Neurology

## 2022-03-31 NOTE — Progress Notes (Signed)
I saw Nicole Rasmussen in neurology clinic on 04/06/22 in follow up for neuropathy.  HPI: Nicole Rasmussen is a 61 y.o. year old female with a history of B12 deficiency, OA c/b joint pain and back pain, GERD, HLD, prediabetes, vit D deficiency  who we last saw on 12/17/21.  To briefly review: Patient is having tingling in her left neck, arm, and leg. When she has back pain, it is also on the left. The tingling in the arms started in the last 6 months. It was coming and going until about 2 months ago when it became constant. It is a shock like sensation. It radiates from her neck into all of her fingers. Bending her neck (like when shampooing at the hair salon) makes the sensations worse. The pain is particularly uncomfortable at night and can wake her up. She can straighten her arm and it will help sometimes. She sleeps on her side, but tries to avoid her left side. She often has her arms curled up. The pain can be 10/10, but averages 6/10. It occasionally can complete go away. She occasionally gets similar symptoms on the right but very rare. She denies weakness. She does have pain in her right knee that makes walking difficult at times.    She takes gabapentin 400 mg QID (given by PCP). It helps knock down the pain along with diclofenac, flexeril, and tylenol. She has previously tried PT (many years ago) for her back and this did not help much.   She also endorses tingling in both feet (maybe right more than left). She does not currently have this tingling though.   Patient was seen by Dr. Carlean Purl in GI for abdominal pain of unknown origin. The clinic note from 09/17/21 was reviewed. Patient had previously been seen by [facility], but wanted second opinion for her pain, so consult to [facility] neurology was placed.    Patient previously saw Dr. Rexene Alberts at [facility] (last seen 05/06/20). The clinic notes indicate patient had burning/tingling sensations in her right thigh. She also  described radiating pain from lower back and a long standing history of low back pain. Per 12/15/15 office note:  "she had a lumbar spine MRI without contrast at [facility] on 09/27/2013: Depression: This is an abnormal MRI of the lumbar spine showing degenerative changes at L3-L4, L4-L5, and L5-S1 as detailed above. The post significant findings are at L5-S1 were severe facet hypertrophy causes moderate left foraminal narrowing that could lead to dynamic impingement of the left L5 nerve root." Patient had EMG on 01/27/16. Per clinic note on 04/18/16, the impression was:  This NCV/EMG study showed the following: 1.    No evidence of polyneuropathy.    2.    Mild chronic right L5 radiculopathy without active features. 3.    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   MRI of lumbar spine showed degenerative changes so patient was referred to spine. She saw Dr. Erlinda Hong in othropedics. The note from 10/10/20 indicates she was seen for low back pain. Patient was referred to PT, given diclofenac, and recommended to lose weight.   EtOH use: Socially, beer or wine (1-2 per day; 4 times per week)  Restrictive diet? No Family history of neuropathy/myopathy/NM disease? None   12/17/21 Assessment: Overall, neck and left arm symptoms are likely related and perhaps secondary to cervical radiculopathy. A focal neuropathy such as ulnar or median neuropathy is also possible, but less likely. Her numbness and  tingling in feet could be due to prior lumbar radiculopathy vs peripheral neuropathy (maybe due to prediabetes).   PLAN: -Blood work: B12, SPEP, IFE to evaluate for treatable causes of neuropathy -EMG for left upper extremity -Consult to physical therapy for neck and left arm pain -Continue gabapentin 400 mg QID, flexeril, diclofenac, and tylenol as needed for pain. Can consider increasing gabapentin if not getting symptom relief.   Since their last visit: Labs showed a  borderline low B12 level (284). EMG of the LUE was consistent with upper limb manifestations of PN with no evidence of cervical radiculopathy or median or ulnar mononeuropathy.  Patient's pain is getting worse in her neck. It is still on the left. When she tilts her head back, she gets pain in the left neck and her arm tingling. She takes ibuprofen for her knee, but this also helps with shoulder. She is also on flexeril and meloxicam for her neck discomfort.  She takes gabapentin after work and again before bed usually. She takes 400 mg 2-3 times per day. It helps. She is happy with the relief she gets at night. She is not able to take gabapentin during the day because it makes her too sleepy.    She went to PT. She went to 6-7 sessions. This did not help, so she stopped going. She was doing home exercises, but this did not help.   She has also gotten a TENS unit, but this has also not helped much.   MEDICATIONS:  Outpatient Encounter Medications as of 04/06/2022  Medication Sig   albuterol (VENTOLIN HFA) 108 (90 Base) MCG/ACT inhaler Inhale 2 puffs into the lungs every 4 (four) hours as needed for wheezing or shortness of breath.   aspirin EC 81 MG tablet Take 81 mg by mouth daily.   atorvastatin (LIPITOR) 10 MG tablet Take 10 mg by mouth daily.   cyclobenzaprine (FLEXERIL) 10 MG tablet Take 10 mg by mouth 3 (three) times daily as needed for muscle spasms.   EPINEPHrine (EPIPEN 2-PAK) 0.3 mg/0.3 mL IJ SOAJ injection Inject 0.3 mg into the muscle as needed for anaphylaxis.   gabapentin (NEURONTIN) 100 MG capsule Take 1 capsule (100 mg total) by mouth 2 (two) times daily.   gabapentin (NEURONTIN) 400 MG capsule Take 1 capsule by mouth 3 (three) times daily as needed.   loratadine (CLARITIN) 10 MG tablet Take 1 tablet (10 mg total) by mouth 2 (two) times daily.   Olopatadine-Mometasone (RYALTRIS) [id number] MCG/ACT SUSP Place 2 sprays into the nose 2 (two) times daily.   potassium chloride (MICRO-K)  10 MEQ CR capsule Take 1 capsule by mouth 2 (two) times daily.   diclofenac (VOLTAREN) 75 MG EC tablet Take 75 mg by mouth 2 (two) times daily.   No facility-administered encounter medications on file as of 04/06/2022.    PAST MEDICAL HISTORY: Past Medical History:  Diagnosis Date   Acid reflux    Allergy    Anemia    Arthritis    Asthma    no inhalers   B12 deficiency    Dysmetabolic syndrome X    Dyspnea    Food allergy    shellfish   GERD (gastroesophageal reflux disease)    HLD (hyperlipidemia)    Joint pain    knees and back   Lower back pain    Nerve pain    top of right thigh   Neuromuscular disorder (HCC)    bilateral leg pain from back issue   Prediabetes  Shortness of breath    Sinus problem    Swallowing difficulty    Vitamin B 12 deficiency    Vitamin D deficiency     PAST SURGICAL HISTORY: Past Surgical History:  Procedure Laterality Date   CESAREAN SECTION     1 time   COLONOSCOPY     ESOPHAGOGASTRODUODENOSCOPY     FOOT SURGERY Bilateral 1980's   bunionectomy and hammer toe on both feet   MOUTH SURGERY  2011   had implants/ root canal/bridges   WISDOM TOOTH EXTRACTION     with the implants    ALLERGIES: Allergies  Allergen Reactions   Shellfish Allergy Hives and Swelling   Latex Hives    hives   Sulfonamide Derivatives Other (See Comments)    Rash, itching    FAMILY HISTORY: Family History  Problem Relation Age of Onset   Cancer Mother    Hypertension Mother    Hyperlipidemia Mother    Heart disease Mother    Breast cancer Mother    Cancer Father    Kidney disease Father    Prostate cancer Father    Hyperlipidemia Sister    Diabetes Sister    Hypertension Sister    Hypertension Sister    Hypertension Sister    Hypertension Sister    Hyperlipidemia Brother    Hypertension Brother    Prostate cancer Brother    Hypertension Brother    Hypertension Brother    Hypertension Brother    Colon cancer Neg Hx    Esophageal  cancer Neg Hx    Pancreatic cancer Neg Hx    Stomach cancer Neg Hx     SOCIAL HISTORY: Social History   Tobacco Use   Smoking status: Former    Packs/day: 1.00    Years: 20.00    Total pack years: 20.00    Types: Cigarettes    Quit date: 05/02/1996    Years since quitting: 25.9    Passive exposure: Never   Smokeless tobacco: Never  Vaping Use   Vaping Use: Never used  Substance Use Topics   Alcohol use: Yes    Comment: social   Drug use: No   Social History   Social History Narrative   Occasional caffeine use    Right handed    Live in a one level home husband and daughter when home from school   Duty [employer]     Objective:  Vital Signs:  BP (!) 143/83   Pulse 93   Ht '5\' 8"'$  (1.727 m)   Wt 272 lb (123.4 kg)   LMP 10/11/2010   SpO2 99%   BMI 41.36 kg/m   General: General appearance: Awake and alert. No distress. Cooperative with exam. Skin: No rash or jaundice. HEENT: Atraumatic. Anicteric. Lungs: Non-labored breathing on room air   Neurological: Mental Status: Alert. Speech fluent. No pseudobulbar affect Cranial Nerves: CNII: No RAPD. Visual fields intact. CNIII, IV, VI: PERRL. No nystagmus. EOMI. CN V: Facial sensation intact bilaterally to fine touch. CN VII: Facial muscles symmetric and strong. No ptosis at rest. CN VIII: Hears finger rub well bilaterally. CN IX: No hypophonia. CN X: Palate elevates symmetrically. CN XI: Full strength shoulder shrug bilaterally. CN XII: Tongue protrusion full and midline. No atrophy or fasciculations. No significant dysarthria Motor: Tone is normal. No atrophy.  Individual muscle group testing (MRC grade out of 5):  Movement     Neck flexion 5    Neck extension 5     Right Left  Shoulder abduction 5 5   Elbow flexion 5 5   Elbow extension 5 5   Finger abduction - FDI 5 5   Finger abduction - ADM 5 5   Finger extension 5 5   Finger distal flexion - 2/'3 5 5   '$ Finger distal flexion - 4/'5 5 5   '$ Thumb  flexion - FPL 5 5   Thumb abduction - APB 5 5    Hip flexion 5 5   Knee extension 5 5   Knee flexion 5 5   Dorsiflexion 5 5   Plantarflexion 5 5     Reflexes:  Right Left  Bicep 2+ 2+  Tricep 2+ 2+  BrRad 2+ 2+  Knee 2+ 2+  Ankle 1+ 1+   Sensation: Intact in all extremities Gait: Normal, narrow-based gait.    Lab and Test Review: New results: 12/17/21: B12 284, IFE and SPEP with no M protein  EMG (01/31/22): NCV & EMG Findings: Extensive electrodiagnostic evaluation of the left upper limb with additional nerve conduction studies of the right upper limb shows: Absent median and ulnar sensory responses bilaterally. The left radial sensory response is within normal limits. Bilateral medial antebrachial cutaneous sensory responses are absent and bilateral lateral antebrachial cutaneous sensory response show reduced amplitude (L8, R8 V). Left median (APB) and left ulnar (ADM) motor responses are within normal limits. There is no evidence of active or chronic motor axon loss changes affecting any of the tested muscles. Motor unit configuration and recruitment pattern is within normal limits.   Impression: This is an abnormal study of the left upper limb. 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  The absent bilateral medial antebrachial cutaneous sensory responses and low amplitude bilateral lateral antebrachial cutaneous sensory response are likely technical in nature and not due to bilateral plexopathy given the normal needle examination.  There is no evidence of a left cervical (C5-T1) motor radiculopathy.  There is no definitive evidence of a left median mononeuropathy at or distal to the wrist (ie no evidence of left carpal tunnel syndrome).   Previously  reviewed results: HbA1c (08/2019): 6.3 TSH: 1.44  MRI lumbar spine (11/17/16): FINDINGS:  The lumbar vertebrae demonstrate normal alignment, body height and marrow signal. T12-L1 and L1-L2show only minor facet hypertrophy without compression.L2-3 shows asymmetric facet hypertrophy resulting in mild left narrowing but no compression.L3-4 shows severe lateral disc osteophyte bulge to the left and  asymmetric facet hypertrophy resulting in severe foraminal narrowing on left and likely impingement on the exiting nerve root on left.L4-5 also shows similar changes but moderate narrowing the left foramina.Marland Kitchen L5-S1 also shows asymmetric facet hypertrophy with severe left sided foraminall narrowing with likely impingement of the exiting nerve root.the conus medullaris terminates at L1. The visualized lower thoracic vertebrae and paraspinal soft tissue appear unremarkable.    MRI cervical spine (08/28/10): Findings: There is no abnormality at the foramen magnum, C1-2, C2-  3, C3-4 or C4-5.  The discs are normal.  The canal and foramina are  widely patent.  No osseous or articular finding.   At C5-6, there is spondylosis with endplate osteophytes and shallow  broad-based herniation more towards the left.  This effaces the  ventral subarachnoid space indents the cord slightly.  No foraminal  extension is seen.   C6-7, C7-T1 and  T1-2 are normal.   IMPRESSION:  Single level pathology at C5-6, there is disc degeneration with  endplate osteophytes and shallow left posterolateral predominant  disc herniation.  This effaces the ventral subarachnoid space and  indents the cord slightly on the left.  No foraminal extension is  detected.    EMG (01/27/16 at [facility]): 1.    No evidence of polyneuropathy.    2.    Mild chronic right L5 radiculopathy without active features. 3.    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   ASSESSMENT: This is Nicole Rasmussen, a  61 y.o. female with:  Left neck, shoulder pain with radiating numbness and tingling - worse with tilting head backward or certain positions. Recent EMG without evidence of cervical radiculopathy, but previous MRI cervical spine (2012) showed concern for effacement of left cord (~C5-6). The worsening symptoms could be evolution of this pathology. 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 Continue gabapentin 400 mg twice nightly with flexeril and meloxicam -During day gabapentin and flexeril makes patient too sleepy to take. Will start lower dose gabapentin 100 mg twice during the day. I also gave recommendations for topicals lidocaine cream and lidocaine patches -Encouraged home PT exercises for neck -Continue B12 supplementation 1000 mcg daily  Return to clinic in 3 months  Total time spent reviewing records, interview, history/exam, documentation, and coordination of care on day of encounter:  30 min  Kai Levins, MD

## 2022-04-06 ENCOUNTER — Encounter: Payer: Self-pay | Admitting: Neurology

## 2022-04-06 ENCOUNTER — Ambulatory Visit: Payer: 59 | Admitting: Neurology

## 2022-04-06 VITALS — BP 143/83 | HR 93 | Ht 68.0 in | Wt 272.0 lb

## 2022-04-06 DIAGNOSIS — M5442 Lumbago with sciatica, left side: Secondary | ICD-10-CM

## 2022-04-06 DIAGNOSIS — R202 Paresthesia of skin: Secondary | ICD-10-CM

## 2022-04-06 DIAGNOSIS — R209 Unspecified disturbances of skin sensation: Secondary | ICD-10-CM | POA: Diagnosis not present

## 2022-04-06 DIAGNOSIS — R200 Anesthesia of skin: Secondary | ICD-10-CM

## 2022-04-06 DIAGNOSIS — G8929 Other chronic pain: Secondary | ICD-10-CM

## 2022-04-06 DIAGNOSIS — M542 Cervicalgia: Secondary | ICD-10-CM

## 2022-04-06 DIAGNOSIS — G629 Polyneuropathy, unspecified: Secondary | ICD-10-CM | POA: Diagnosis not present

## 2022-04-06 DIAGNOSIS — G5711 Meralgia paresthetica, right lower limb: Secondary | ICD-10-CM

## 2022-04-06 DIAGNOSIS — M5412 Radiculopathy, cervical region: Secondary | ICD-10-CM

## 2022-04-06 MED ORDER — GABAPENTIN 100 MG PO CAPS: 100.0000 mg | ORAL_CAPSULE | Freq: Two times a day (BID) | ORAL | 5 refills | Status: DC

## 2022-04-06 NOTE — Patient Instructions (Addendum)
I would like to get an MRI of your cervical spine (neck). I will be in touch when I have your results.  For your symptoms, it sounds like at night that symptoms are controlled.  -Continue gabapentin 400 mg -Continue flexeril and meloxicam  During the day, these medication make you tired, so we will try: -Low dose gabapentin, 100 mg twice during the day. I sent this prescription to your pharmacy. -Lidocaine cream as needed. Wear gloves to prevent hands being numb. Or lidocaine pain patch  I would like to see you back in clinic in 3 months.  The physicians and staff at [facility] are committed to providing excellent care. You may receive a survey requesting feedback about your experience at our office. We strive to receive "very good" responses to the survey questions. If you feel that your experience would prevent you from giving the office a "very good " response, please contact our office to try to remedy the situation. We may be reached at [phone]. Thank you for taking the time out of your busy day to complete the survey.  Kai Levins, MD [facility]

## 2022-04-27 ENCOUNTER — Ambulatory Visit: Payer: 59 | Admitting: Internal Medicine

## 2022-04-27 ENCOUNTER — Encounter: Payer: Self-pay | Admitting: Internal Medicine

## 2022-04-27 VITALS — BP 130/76 | HR 89 | Temp 97.9°F | Resp 18 | Wt 266.8 lb

## 2022-04-27 DIAGNOSIS — J302 Other seasonal allergic rhinitis: Secondary | ICD-10-CM

## 2022-04-27 DIAGNOSIS — J019 Acute sinusitis, unspecified: Secondary | ICD-10-CM | POA: Diagnosis not present

## 2022-04-27 DIAGNOSIS — B9689 Other specified bacterial agents as the cause of diseases classified elsewhere: Secondary | ICD-10-CM | POA: Diagnosis not present

## 2022-04-27 DIAGNOSIS — J3089 Other allergic rhinitis: Secondary | ICD-10-CM

## 2022-04-27 MED ORDER — LORATADINE 10 MG PO TABS: 10.0000 mg | ORAL_TABLET | Freq: Two times a day (BID) | ORAL | 5 refills | Status: DC

## 2022-04-27 MED ORDER — AMOXICILLIN-POT CLAVULANATE 875-125 MG PO TABS
1.0000 | ORAL_TABLET | Freq: Two times a day (BID) | ORAL | 0 refills | Status: AC
Start: 2022-04-27 — End: 2022-05-02

## 2022-04-27 NOTE — Progress Notes (Signed)
FOLLOW UP Date of Service/Encounter:  04/27/22   Subjective:  Nicole Rasmussen (DOB: 1961/04/21) is a 61 y.o. female who returns to the [facility] [place] on 04/27/2022 for an acute visit.   History obtained from: chart review and patient.  Last visit was with Dr. Simona Huh December 22, 2021 for urticaria and rhinitis.  At that time they did discuss starting AIT but she wishes to hold off on this at the moment.  She was also started on Claritin and Ryaltris.  She also has a hx of mild intermittent asthma on Albuterol PRN and shellfish induced urticaria.   About 1-1.5 week, she developed congestion, runny nose, cough with lots of phlegm.  This then progressed to loss of voice for the past 4 days.  She also has a lot of sinus pressure and ear ache/pressure.  No fevers.  Her nephew recently had the flu.  She tested for COVID at home and was negative x2.  She is using her rinses followed by Ryaltris and Loratadine.  She has also tried OTC cold medicine without improvement.  She is worried that this has been ongoing for over a week and not getting better.   Denies flare up of asthma or use of albuterol.     Past Medical History: Past Medical History:  Diagnosis Date   Acid reflux    Allergy    Anemia    Arthritis    Asthma    no inhalers   B12 deficiency    Dysmetabolic syndrome X    Dyspnea    Food allergy    shellfish   GERD (gastroesophageal reflux disease)    HLD (hyperlipidemia)    Joint pain    knees and back   Lower back pain    Nerve pain    top of right thigh   Neuromuscular disorder (HCC)    bilateral leg pain from back issue   Prediabetes    Shortness of breath    Sinus problem    Swallowing difficulty    Vitamin B 12 deficiency    Vitamin D deficiency     Objective:  BP 130/76 (BP Location: Right Arm, Patient Position: Sitting, Cuff Size: Large)   Pulse 89   Temp 97.9 F (36.6 C) (Temporal)   Resp 18   Wt 266 lb 12.8 oz (121 kg)   LMP 10/11/2010    SpO2 100%   BMI 40.57 kg/m  Body mass index is 40.57 kg/m. Physical Exam: GEN: alert, well developed HEENT: clear conjunctiva, TM grey and translucent, nose with moderate inferior turbinate hypertrophy, pink boggy nasal mucosa, mucoid  rhinorrhea, + cobblestoning HEART: regular rate and rhythm, no murmur LUNGS: clear to auscultation bilaterally, no coughing, unlabored respiration SKIN: no rashes or lesions   Assessment/Plan  Acute Bacterial Rhinosinusitis Allergic Rhinitis - Positive skin test 11/2021: grass pollen, borderline to weed and tree pollen, minor indoor/outdoor molds, dust mites  - Avoidance measures discussed. - Use nasal saline rinses before nose sprays such as with Neilmed Sinus Rinse.  Use distilled water.   - Use Ryaltris 1-2 sprays each nostril twice daily.  - Use Claritin 10 mg daily.  - Consider allergy shots as long term control of your symptoms by teaching your immune system to be more tolerant of your allergy triggers - Use mucinex as needed. - Start Augmentin '875mg'$  1 tablet twice daily for 5 days.  Use a probiotic with this.   - Given information on symptomatic at home care.  F/u with Dr. Simona Huh.   Harlon Flor, MD  [facility] [place] [place]

## 2022-04-27 NOTE — Patient Instructions (Addendum)
Acute Bacterial Rhinosinusitis Allergic Rhinitis - Positive skin test 11/2021: grass pollen, borderline to weed and tree pollen, minor indoor/outdoor molds, dust mites  - Avoidance measures discussed. - Use nasal saline rinses before nose sprays such as with Neilmed Sinus Rinse.  Use distilled water.   - Use Ryaltris 2 sprays each nostril twice daily.  - Use Claritin 10 mg daily.  - Consider allergy shots as long term control of your symptoms by teaching your immune system to be more tolerant of your allergy triggers - Use mucinex as needed. - Start Augmentin '875mg'$  1 tablet twice daily for 5 days.  Use a probiotic with this.    Nasal congestion, post nasal dripping, or sinus pressure:   Nasal rinsing with saline nasal spray or salt water (e.g., Neilmed Sinus Rinse bottle) can help relieve nasal dryness.   Use a warm mist humidifier or take a steamy shower to increase moisture in the air and soothe oral and nasal tissues that become irritated with dry air.   Sore throat:  Use a pain reliever, such as acetaminophen (Tylenol) or ibuprofen (Advil).   Gargle with salt water (1/4 tsp of salt dissolved in 8 oz of warm water). Salt water can be used as often as you like.   Throat lozenges or throat sprays (Cepacol lozenges or Chloroseptic spray) may bring some relief by increasing saliva production and lubricating your throat.   Drink plenty of fluids (e.g., water, diluted juice, tea) to soothe your throat and to stay well hydrated.   Coughing:   Medications that contain dextromethorphan (e.g., Robitussin DM, Mucinex DM, Delsym) may help to suppress a cough.   Tea with honey, when taken regularly, can soothe a sore throat and help suppress a cough. Take care with medications   Strengthen your immune system:   Make sure you eat well (e.g., a healthy, balanced variety of foods).   Avoid alcohol as it can directly suppress various immune responses.   Stay away from cigarettes, and second hand smoke.    Rest as much as possible and get plenty of sleep (at least 8 hours).

## 2022-04-28 ENCOUNTER — Encounter: Payer: Self-pay | Admitting: Neurology

## 2022-04-29 ENCOUNTER — Telehealth: Payer: Self-pay

## 2022-04-29 NOTE — Telephone Encounter (Signed)
Received confirmation for MRI -, Cervical Spine without contrast. Good until 06/12/22

## 2022-05-01 ENCOUNTER — Other Ambulatory Visit: Payer: 59

## 2022-05-14 ENCOUNTER — Ambulatory Visit
Admission: RE | Admit: 2022-05-14 | Discharge: 2022-05-14 | Disposition: A | Discharge status: Home / Self Care | Payer: 59 | Source: Ambulatory Visit | Attending: Neurology | Admitting: Neurology

## 2022-05-14 DIAGNOSIS — R200 Anesthesia of skin: Secondary | ICD-10-CM

## 2022-05-14 DIAGNOSIS — M5412 Radiculopathy, cervical region: Secondary | ICD-10-CM

## 2022-05-16 ENCOUNTER — Other Ambulatory Visit: Payer: Self-pay

## 2022-05-16 DIAGNOSIS — G5711 Meralgia paresthetica, right lower limb: Secondary | ICD-10-CM

## 2022-05-16 DIAGNOSIS — M5412 Radiculopathy, cervical region: Secondary | ICD-10-CM

## 2022-05-16 DIAGNOSIS — G8929 Other chronic pain: Secondary | ICD-10-CM

## 2022-05-16 DIAGNOSIS — R202 Paresthesia of skin: Secondary | ICD-10-CM

## 2022-05-16 DIAGNOSIS — G629 Polyneuropathy, unspecified: Secondary | ICD-10-CM

## 2022-05-16 DIAGNOSIS — M542 Cervicalgia: Secondary | ICD-10-CM

## 2022-05-16 DIAGNOSIS — R200 Anesthesia of skin: Secondary | ICD-10-CM

## 2022-05-16 DIAGNOSIS — R209 Unspecified disturbances of skin sensation: Secondary | ICD-10-CM

## 2022-05-30 ENCOUNTER — Encounter: Payer: Self-pay | Admitting: Neurology

## 2022-05-31 ENCOUNTER — Other Ambulatory Visit: Payer: Self-pay | Admitting: Neurology

## 2022-05-31 DIAGNOSIS — M5412 Radiculopathy, cervical region: Secondary | ICD-10-CM

## 2022-05-31 DIAGNOSIS — M542 Cervicalgia: Secondary | ICD-10-CM

## 2022-05-31 DIAGNOSIS — G8929 Other chronic pain: Secondary | ICD-10-CM

## 2022-05-31 MED ORDER — IBUPROFEN 800 MG PO TABS: 800.0000 mg | ORAL_TABLET | Freq: Three times a day (TID) | ORAL | 0 refills | Status: DC | PRN

## 2022-06-08 ENCOUNTER — Ambulatory Visit: Payer: 59 | Admitting: Internal Medicine

## 2022-07-04 NOTE — Progress Notes (Deleted)
I saw [facility] in [facility] on 07/14/22 in follow up for neuropathy.  HPI: Nicole [facility] is a 62 y.o. year old female with a history of B12 deficiency, cervical spondylosis, OA c/b joint pain and back pain, GERD, HLD, prediabetes, vit D deficiency who we last saw on 04/06/22.  To briefly review: Patient is having tingling in her left neck, arm, and leg. When she has back pain, it is also on the left. The tingling in the arms started in the last 6 months. It was coming and going until about 2 months ago when it became constant. It is a shock like sensation. It radiates from her neck into all of her fingers. Bending her neck (like when shampooing at the hair salon) makes the sensations worse. The pain is particularly uncomfortable at night and can wake her up. She can straighten her arm and it will help sometimes. She sleeps on her side, but tries to avoid her left side. She often has her arms curled up. The pain can be 10/10, but averages 6/10. It occasionally can complete go away. She occasionally gets similar symptoms on the right but very rare. She denies weakness. She does have pain in her right knee that makes walking difficult at times.    She takes gabapentin 400 mg QID (given by PCP). It helps knock down the pain along with diclofenac, flexeril, and tylenol. She has previously tried PT (many years ago) for her back and this did not help much.   She also endorses tingling in both feet (maybe right more than left). She does not currently have this tingling though.   Patient was seen by Nicole [facility] in GI for abdominal pain of unknown origin. The clinic note from 09/17/21 was reviewed. Patient had previously been seen by [facility], but wanted second opinion for her pain, so consult to [facility] was placed.    Patient previously saw [facility] at [facility] (last seen 05/06/20). The clinic notes indicate patient had burning/tingling sensations in her right  thigh. She also described radiating pain from lower back and a long standing history of low back pain. Per 12/15/15 office note:  "she had a lumbar spine MRI without contrast at [facility] on 09/27/2013: Depression: This is an abnormal MRI of the lumbar spine showing degenerative changes at L3-L4, L4-L5, and L5-S1 as detailed above. The post significant findings are at L5-S1 were severe facet hypertrophy causes moderate left foraminal narrowing that could lead to dynamic impingement of the left L5 nerve root." Patient had EMG on 01/27/16. Per clinic note on 04/18/16, the impression was:  This NCV/EMG study showed the following: 1.    No evidence of polyneuropathy.    2.    Mild chronic right L5 radiculopathy without active features. 3.    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   MRI of lumbar spine showed degenerative changes so patient was referred to spine. She saw [facility]. The note from 10/10/20 indicates she was seen for low back pain. Patient was referred to PT, given diclofenac, and recommended to lose weight.   EtOH use: Socially, beer or wine (1-2 per day; 4 times per week)  Restrictive diet? No Family history of neuropathy/myopathy/NM disease? None  04/06/22: Labs showed a borderline low B12 level (284). EMG of the LUE was consistent with upper limb manifestations of PN with no evidence of cervical radiculopathy or median or ulnar mononeuropathy.   Patient's  pain is getting worse in her neck. It is still on the left. When she tilts her head back, she gets pain in the left neck and her arm tingling. She takes ibuprofen for her knee, but this also helps with shoulder. She is also on flexeril and meloxicam for her neck discomfort.   She takes gabapentin after work and again before bed usually. She takes 400 mg 2-3 times per day. It helps. She is happy with the relief she gets at night. She is not able to take gabapentin during the day  because it makes her too sleepy.    She went to PT. She went to 6-7 sessions. This did not help, so she stopped going. She was doing home exercises, but this did not help.    She has also gotten a TENS unit, but this has also not helped much.  Most recent Assessment and Plan (04/06/22): This is Nicole [facility], a 62 y.o. female with: Left neck, shoulder pain with radiating numbness and tingling - worse with tilting head backward or certain positions. Recent EMG without evidence of cervical radiculopathy, but previous MRI cervical spine (2012) showed concern for effacement of left cord (~C5-6). The worsening symptoms could be evolution of this pathology. 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 Continue gabapentin 400 mg twice nightly with flexeril and meloxicam -During day gabapentin and flexeril makes patient too sleepy to take. Will start lower dose gabapentin 100 mg twice during the day. I also gave recommendations for topicals lidocaine cream and lidocaine patches -Encouraged home PT exercises for neck -Continue B12 supplementation 1000 mcg daily  Since their last visit: MRI cervical spine showed left foraminal disc osteophyte complex at C5-6 with significant left foraminal stenosis. Due to this, referral to spine surgery was made. ***  ROS: Pertinent positive and negative systems reviewed in HPI. ***   MEDICATIONS:  Outpatient Encounter Medications as of 07/14/2022  Medication Sig   albuterol (VENTOLIN HFA) 108 (90 Base) MCG/ACT inhaler Inhale 2 puffs into the lungs every 4 (four) hours as needed for wheezing or shortness of breath.   aspirin EC 81 MG tablet Take 81 mg by mouth daily.   atorvastatin (LIPITOR) 10 MG tablet Take 10 mg by mouth daily.   cyanocobalamin (VITAMIN B12) 1000 MCG tablet Take 1,000 mcg  by mouth daily.   cyclobenzaprine (FLEXERIL) 10 MG tablet Take 10 mg by mouth 3 (three) times daily as needed for muscle spasms.   EPINEPHrine (EPIPEN 2-PAK) 0.3 mg/0.3 mL IJ SOAJ injection Inject 0.3 mg into the muscle as needed for anaphylaxis.   gabapentin (NEURONTIN) 100 MG capsule Take 1 capsule (100 mg total) by mouth 2 (two) times daily.   gabapentin (NEURONTIN) 400 MG capsule Take 1 capsule by mouth 3 (three) times daily as needed.   ibuprofen (ADVIL) 800 MG tablet Take 1 tablet (800 mg total) by mouth 3 (three) times daily as needed for moderate pain.   loratadine (CLARITIN) 10 MG tablet Take 1 tablet (10 mg total) by mouth 2 (two) times daily.   Olopatadine-Mometasone (RYALTRIS) [id number] MCG/ACT SUSP Place 2 sprays into the nose 2 (two) times daily.   potassium chloride (MICRO-K) 10 MEQ CR capsule Take 1 capsule by mouth 2 (two) times daily.   No facility-administered encounter medications on file as of 07/14/2022.    PAST MEDICAL HISTORY:  Past Medical History:  Diagnosis Date   Acid reflux    Allergy    Anemia    Arthritis    Asthma    no inhalers   B12 deficiency    Dysmetabolic syndrome X    Dyspnea    Food allergy    shellfish   GERD (gastroesophageal reflux disease)    HLD (hyperlipidemia)    Joint pain    knees and back   Lower back pain    Nerve pain    top of right thigh   Neuromuscular disorder (HCC)    bilateral leg pain from back issue   Prediabetes    Shortness of breath    Sinus problem    Swallowing difficulty    Vitamin B 12 deficiency    Vitamin D deficiency     PAST SURGICAL HISTORY: Past Surgical History:  Procedure Laterality Date   CESAREAN SECTION     1 time   COLONOSCOPY     ESOPHAGOGASTRODUODENOSCOPY     FOOT SURGERY Bilateral 1980's   bunionectomy and hammer toe on both feet   MOUTH SURGERY  2011   had implants/ root canal/bridges   WISDOM TOOTH EXTRACTION     with the implants    ALLERGIES: Allergies  Allergen Reactions    Shellfish Allergy Hives and Swelling   Latex Hives    hives   Sulfonamide Derivatives Other (See Comments)    Rash, itching    FAMILY HISTORY: Family History  Problem Relation Age of Onset   Cancer Mother    Hypertension Mother    Hyperlipidemia Mother    Heart disease Mother    Breast cancer Mother    Cancer Father    Kidney disease Father    Prostate cancer Father    Hyperlipidemia Sister    Diabetes Sister    Hypertension Sister    Hypertension Sister    Hypertension Sister    Hypertension Sister    Hyperlipidemia Brother    Hypertension Brother    Prostate cancer Brother    Hypertension Brother    Hypertension Brother    Hypertension Brother    Colon cancer Neg Hx    Esophageal cancer Neg Hx    Pancreatic cancer Neg Hx    Stomach cancer Neg Hx     SOCIAL HISTORY: Social History   Tobacco Use   Smoking status: Former    Packs/day: 1.00    Years: 20.00    Total pack years: 20.00    Types: Cigarettes    Quit date: 05/02/1996    Years since quitting: 26.1    Passive exposure: Never   Smokeless tobacco: Never  Vaping Use   Vaping Use: Never used  Substance Use Topics   Alcohol use: Yes    Comment: social   Drug use: No   Social History   Social History Narrative   Occasional caffeine use    Right handed    Live in a one level home husband and daughter when home from school   Duty [employer]     Objective:  Vital Signs:  LMP 10/11/2010   General:*** General appearance: Awake and alert. No distress. Cooperative with exam.  Skin: No obvious rash or jaundice. HEENT: Atraumatic. Anicteric. Lungs: Non-labored breathing on room air  Heart: Regular Abdomen: Soft, non tender. Extremities: No edema. No obvious deformity.  Musculoskeletal: No obvious joint swelling.  Neurological: Mental Status: Alert. Speech fluent. No pseudobulbar affect Cranial Nerves: CNII: No RAPD. Visual fields intact.  CNIII, IV, VI: PERRL. No nystagmus. EOMI. CN V: Facial  sensation intact bilaterally to fine touch. [place] clench strong. Jaw jerk***. CN VII: Facial muscles symmetric and strong. No ptosis at rest or after sustained upgaze***. CN VIII: Hears finger rub well bilaterally. CN IX: No hypophonia. CN X: Palate elevates symmetrically. CN XI: Full strength shoulder shrug bilaterally. CN XII: Tongue protrusion full and midline. No atrophy or fasciculations. No significant dysarthria*** Motor: Tone is ***. *** fasciculations in *** extremities. *** atrophy. No grip or percussive myotonia.  Individual muscle group testing (MRC grade out of 5):  Movement     Neck flexion ***    Neck extension ***     Right Left   Shoulder abduction *** ***   Shoulder adduction *** ***   Shoulder ext rotation *** ***   Shoulder int rotation *** ***   Elbow flexion *** ***   Elbow extension *** ***   Wrist extension *** ***   Wrist flexion *** ***   Finger abduction - FDI *** ***   Finger abduction - ADM *** ***   Finger extension *** ***   Finger distal flexion - 2/3 *** ***   Finger distal flexion - 4/5 *** ***   Thumb flexion - FPL *** ***   Thumb abduction - APB *** ***    Hip flexion *** ***   Hip extension *** ***   Hip adduction *** ***   Hip abduction *** ***   Knee extension *** ***   Knee flexion *** ***   Dorsiflexion *** ***   Plantarflexion *** ***   Inversion *** ***   Eversion *** ***   Great toe extension *** ***   Great toe flexion *** ***     Reflexes:  Right Left  Bicep *** ***  Tricep *** ***  BrRad *** ***  Knee *** ***  Ankle *** ***   Pathological Reflexes: Babinski: *** response bilaterally*** Hoffman: *** Troemner: *** Pectoral: *** Palmomental: *** Facial: *** Midline tap: *** Sensation: Pinprick: *** Vibration: *** Temperature: *** Proprioception: *** Coordination: Intact finger-to- nose-finger and heel-to-shin bilaterally. Romberg negative.*** Gait: Able to rise from chair with arms crossed  unassisted. Normal, narrow-based gait. Able to tandem walk. Able to walk on toes and heels.***   Lab and Test Review: New results: MRI cervical spine wo contrast (05/14/22): FINDINGS: Alignment: Normal   Vertebrae: Normal marrow signal. No bone lesions or fractures.   Cord: Normal cord signal intensity. No cord lesions or syrinx.   Posterior Fossa, vertebral arteries, paraspinal tissues: No significant findings.   Disc levels:   C2-3: No significant findings.   C3-4: Very shallow central disc protrusion but no neural compression. No foraminal stenosis.   C4-5: No significant findings.   C5-6: Diffuse bulging annulus and mild osteophytic ridging with flattening of the ventral thecal sac and mild narrowing of the ventral CSF space. There is also a left foraminal disc osteophyte complex with significant left foraminal stenosis likely involving the left C6 nerve root and responsible for the patient's left radicular symptoms.   C6-7: Minimal disc bulge but no significant disc protrusions, spinal or foraminal stenosis.   C7-T1: No significant findings. Slightly dilated exiting C8 nerve root sheaths purses perineural cysts. No significant findings.   IMPRESSION: 1. Left foraminal disc osteophyte complex at C5-6 with significant left foraminal stenosis likely responsible for the patient's left radicular symptoms. 2. Very shallow central disc protrusion at C3-4 but no neural compression. 3. Normal MR appearance  of the cervical spinal cord.    Previously reviewed results: 12/17/21: B12 284, IFE and SPEP with no M protein  HbA1c (08/2019): 6.3 TSH: 1.44   EMG (01/31/22): NCV & EMG Findings: Extensive electrodiagnostic evaluation of the left upper limb with additional nerve conduction studies of the right upper limb shows: Absent median and ulnar sensory responses bilaterally. The left radial sensory response is within normal limits. Bilateral medial antebrachial cutaneous  sensory responses are absent and bilateral lateral antebrachial cutaneous sensory response show reduced amplitude (L8, R8 V). Left median (APB) and left ulnar (ADM) motor responses are within normal limits. There is no evidence of active or chronic motor axon loss changes affecting any of the tested muscles. Motor unit configuration and recruitment pattern is within normal limits.   Impression: This is an abnormal study of the left upper limb. 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  The absent bilateral medial antebrachial cutaneous sensory responses and low amplitude bilateral lateral antebrachial cutaneous sensory response are likely technical in nature and not due to bilateral plexopathy given the normal needle examination.  There is no evidence of a left cervical (C5-T1) motor radiculopathy.  There is no definitive evidence of a left median mononeuropathy at or distal to the wrist (ie no evidence of left carpal tunnel syndrome).   MRI lumbar spine (11/17/16): FINDINGS:  The lumbar vertebrae demonstrate normal alignment, body height and marrow signal. T12-L1 and L1-L2show only minor facet hypertrophy without compression.L2-3 shows asymmetric facet hypertrophy resulting in mild left narrowing but no compression.L3-4 shows severe lateral disc osteophyte bulge to the left and  asymmetric facet hypertrophy resulting in severe foraminal narrowing on left and likely impingement on the exiting nerve root on left.L4-5 also shows similar changes but moderate narrowing the left foramina.Marland Kitchen L5-S1 also shows asymmetric facet hypertrophy with severe left sided foraminall narrowing with likely impingement of the exiting nerve root.the conus medullaris terminates at L1. The visualized lower  thoracic vertebrae and paraspinal soft tissue appear unremarkable.    MRI cervical spine (08/28/10): Findings: There is no abnormality at the foramen magnum, C1-2, C2-  3, C3-4 or C4-5.  The discs are normal.  The canal and foramina are  widely patent.  No osseous or articular finding.   At C5-6, there is spondylosis with endplate osteophytes and shallow  broad-based herniation more towards the left.  This effaces the  ventral subarachnoid space indents the cord slightly.  No foraminal  extension is seen.   C6-7, C7-T1 and T1-2 are normal.   IMPRESSION:  Single level pathology at C5-6, there is disc degeneration with  endplate osteophytes and shallow left posterolateral predominant  disc herniation.  This effaces the ventral subarachnoid space and  indents the cord slightly on the left.  No foraminal extension is  detected.    EMG (01/27/16 at [facility]): 1.    No evidence of polyneuropathy.    2.    Mild chronic right L5 radiculopathy without active features. 3.    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  ASSESSMENT: This is Nicole Mileto, a 62 y.o. female with: Left neck, shoulder pain with radiating numbness and tingling - worse with tilting head backward or certain positions. EMG without evidence of cervical radiculopathy, but previous MRI cervical spine (05/2022) showed concern  for severe left foraminal stenosis at C6. 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 Continue gabapentin 400 mg twice nightly with flexeril and meloxicam -During day gabapentin and flexeril makes patient too sleepy to take. Will start lower dose gabapentin 100 mg twice during the day. I also gave recommendations for topicals lidocaine cream and  lidocaine patches -Encouraged home PT exercises for neck -Continue B12 supplementation 1000 mcg daily  Return to clinic in ***  Total time spent reviewing records, interview, history/exam, documentation, and coordination of care on day of encounter:  *** min  Kai Levins, MD

## 2022-07-14 ENCOUNTER — Ambulatory Visit: Payer: 59 | Admitting: Neurology

## 2022-07-14 ENCOUNTER — Encounter: Payer: Self-pay | Admitting: Neurology

## 2022-07-14 DIAGNOSIS — Z029 Encounter for administrative examinations, unspecified: Secondary | ICD-10-CM

## 2022-08-19 ENCOUNTER — Encounter: Payer: Self-pay | Admitting: Allergy

## 2022-08-19 ENCOUNTER — Ambulatory Visit: Payer: 59 | Admitting: Allergy

## 2022-08-19 ENCOUNTER — Other Ambulatory Visit: Payer: Self-pay

## 2022-08-19 VITALS — BP 88/60 | HR 92 | Temp 97.2°F | Resp 18

## 2022-08-19 DIAGNOSIS — R031 Nonspecific low blood-pressure reading: Secondary | ICD-10-CM

## 2022-08-19 DIAGNOSIS — J019 Acute sinusitis, unspecified: Secondary | ICD-10-CM | POA: Diagnosis not present

## 2022-08-19 DIAGNOSIS — B9689 Other specified bacterial agents as the cause of diseases classified elsewhere: Secondary | ICD-10-CM

## 2022-08-19 DIAGNOSIS — J3089 Other allergic rhinitis: Secondary | ICD-10-CM

## 2022-08-19 DIAGNOSIS — J302 Other seasonal allergic rhinitis: Secondary | ICD-10-CM | POA: Diagnosis not present

## 2022-08-19 MED ORDER — AMOXICILLIN-POT CLAVULANATE 875-125 MG PO TABS: 1.0000 | ORAL_TABLET | Freq: Two times a day (BID) | ORAL | 0 refills | Status: AC

## 2022-08-19 MED ORDER — RYALTRIS 665-25 MCG/ACT NA SUSP: 2.0000 | Freq: Two times a day (BID) | NASAL | 5 refills | Status: DC

## 2022-08-19 MED ORDER — LORATADINE 10 MG PO TABS: 10.0000 mg | ORAL_TABLET | Freq: Every day | ORAL | 5 refills | Status: DC

## 2022-08-19 MED ORDER — ALBUTEROL SULFATE HFA 108 (90 BASE) MCG/ACT IN AERS: 2.0000 | INHALATION_SPRAY | RESPIRATORY_TRACT | 1 refills | Status: DC | PRN

## 2022-08-19 NOTE — Patient Instructions (Addendum)
Acute Bacterial Rhinosinusitis Allergic Rhinitis  - Continue avoidance measures for grass pollen, weed, tree pollen, minor indoor/outdoor molds, dust mites  - Use nasal saline rinses or your nasal saline spray before nose sprays such as with Neilmed Sinus Rinse.  Use distilled water.   - Use Ryaltris 2 sprays each nostril twice daily.  - Use Claritin 10 mg daily.  - Consider allergy shots as long term control of your symptoms by teaching your immune system to be more tolerant of your allergy triggers - Have access to albuterol inhaler 2 puffs every 4-6 hours as needed for cough/wheeze/shortness of breath/chest tightness.  May use 15-20 minutes prior to activity.   Monitor frequency of use.   - Use Mucinex DM as needed. Drink with plenty of water - Start Augmentin  1 tablet twice daily for 10 days.  Use a probiotic with this.   - Get plenty of rest and drink plenty of water  Follow-up in 3-4 months or sooner if needed

## 2022-08-19 NOTE — Progress Notes (Signed)
Follow-up Note  RE: Nicole Rasmussen MRN: [id number] DOB: August 29, 1960 Date of Office Visit: 08/19/2022   History of present illness: Nicole Rasmussen is a 62 y.o. female presenting today for acute visit.  She has history of allergic rhinitis.  She was last seen in the office on 04/27/2022 by Dr. Allena Katz. Over the past week now she has been having increased sinus congestion, sinus headache, sinus pain along the nasal bridge and eyes.  She also has been noting red eye that is also itchy and achy.  She has noted some shortness of breath.  She also states the congestion and is thick and yellow but she is blowing out and coughing.  She does not know if she has had fevers but states she has had sweats over the past week.  She has been needing to use albuterol daily because of the shortness of breath.  She also has been doing saline mist as well as her maintenance medications of Ryaltris and Claritin. She will give her Dr. About a week or so ago as she was having back pain and her blood pressure at that visit was elevated and thus her blood pressure medication was changed to olmesartan which she started but since she has been on that she states her blood pressure has been low and it is reading low in our office today.  She will plan to discuss this with her PCP in regards to medication and or dose change.   Review of systems: Review of Systems  Constitutional: Negative.   HENT:         See HPI  Eyes:        See HPI  Respiratory:         See HPI  Cardiovascular: Negative.   Gastrointestinal: Negative.   Musculoskeletal: Negative.   Skin: Negative.   Allergic/Immunologic: Negative.   Neurological: Negative.      All other systems negative unless noted above in HPI  Past medical/social/surgical/family history have been reviewed and are unchanged unless specifically indicated below.  No changes  Medication List: Current Outpatient Medications  Medication Sig Dispense Refill    amoxicillin-clavulanate (AUGMENTIN) 875-125 MG tablet Take 1 tablet by mouth 2 (two) times daily for 10 days. 20 tablet 0   aspirin EC 81 MG tablet Take 81 mg by mouth daily.     atorvastatin (LIPITOR) 10 MG tablet Take 10 mg by mouth daily.     cyclobenzaprine (FLEXERIL) 10 MG tablet Take 10 mg by mouth 3 (three) times daily as needed for muscle spasms.     diclofenac Sodium (VOLTAREN) 1 % GEL      EPINEPHrine (EPIPEN 2-PAK) 0.3 mg/0.3 mL IJ SOAJ injection Inject 0.3 mg into the muscle as needed for anaphylaxis. 2 each 1   gabapentin (NEURONTIN) 100 MG capsule Take 1 capsule (100 mg total) by mouth 2 (two) times daily. 60 capsule 5   gabapentin (NEURONTIN) 400 MG capsule Take 1 capsule by mouth 3 (three) times daily as needed.     ibuprofen (ADVIL) 800 MG tablet Take 1 tablet (800 mg total) by mouth 3 (three) times daily as needed for moderate pain. 30 tablet 0   olmesartan (BENICAR) 20 MG tablet Take 20 mg by mouth daily.     albuterol (VENTOLIN HFA) 108 (90 Base) MCG/ACT inhaler Inhale 2 puffs into the lungs every 4 (four) hours as needed for wheezing or shortness of breath. 18 g 1   cyanocobalamin (VITAMIN B12) 1000 MCG tablet Take 1,000 mcg  by mouth daily. (Patient not taking: Reported on 08/19/2022)     loratadine (CLARITIN) 10 MG tablet Take 1 tablet (10 mg total) by mouth daily. 30 tablet 5   potassium chloride (MICRO-K) 10 MEQ CR capsule Take 1 capsule by mouth 2 (two) times daily. (Patient not taking: Reported on 08/19/2022)     RYALTRIS 665-25 MCG/ACT SUSP Place 2 sprays into the nose 2 (two) times daily. 29 g 5   No current facility-administered medications for this visit.     Known medication allergies: Allergies  Allergen Reactions   Shellfish Allergy Hives and Swelling   Latex Hives    hives   Sulfonamide Derivatives Other (See Comments)    Rash, itching     Physical examination: Blood pressure (!) 88/60, pulse 92, temperature (!) 97.2 F (36.2 C), temperature source  Temporal, resp. rate 18, last menstrual period 10/11/2010, SpO2 93 %.  General: Alert, interactive, in no acute distress. HEENT: PERRLA, right eye lateral sclera with injection, TMs pearly gray, turbinates moderately edematous with thick discharge, post-pharynx mildly erythematous. Neck: Supple without lymphadenopathy. Lungs: Clear to auscultation without wheezing, rhonchi or rales. {no increased work of breathing. CV: Normal S1, S2 without murmurs. Abdomen: Nondistended, nontender. Skin: Warm and dry, without lesions or rashes. Extremities:  No clubbing, cyanosis or edema. Neuro:   Grossly intact.  Diagnositics/Labs: None today  Assessment and plan:   Acute Bacterial Rhinosinusitis Allergic Rhinitis  - Continue avoidance measures for grass pollen, weed, tree pollen, minor indoor/outdoor molds, dust mites  - Use nasal saline rinses or your nasal saline spray before nose sprays such as with Neilmed Sinus Rinse.  Use distilled water.   - Use Ryaltris 2 sprays each nostril twice daily.  - Use Claritin 10 mg daily.  - Consider allergy shots as long term control of your symptoms by teaching your immune system to be more tolerant of your allergy triggers - Have access to albuterol inhaler 2 puffs every 4-6 hours as needed for cough/wheeze/shortness of breath/chest tightness.  May use 15-20 minutes prior to activity.   Monitor frequency of use.   - Use Mucinex DM as needed. Drink with plenty of water - Start Augmentin  1 tablet twice daily for 10 days.  Use a probiotic with this.   - Get plenty of rest and drink plenty of water -Also discussed option adding prednisone to the antibiotic regimen to help with the inflammation, sinus pressure and pain as well as ocular symptoms however she has not had positive experience with previous prednisone use and would like to avoid if possible  Low(er) blood pressure reading - Agree with discussing with PCP in regards to medication management of  olmesartan  Follow-up in 3-4 months or sooner if needed  I appreciate the opportunity to take part in Nicole Rasmussen's care. Please do not hesitate to contact me with questions.  Sincerely,   Margo Aye, MD Allergy/Immunology [facility]

## 2022-10-21 ENCOUNTER — Ambulatory Visit (HOSPITAL_COMMUNITY)
Admission: RE | Admit: 2022-10-21 | Discharge: 2022-10-21 | Disposition: A | Discharge status: Home / Self Care | Payer: 59 | Source: Ambulatory Visit | Attending: Nurse Practitioner | Admitting: Nurse Practitioner

## 2022-10-21 DIAGNOSIS — R911 Solitary pulmonary nodule: Secondary | ICD-10-CM

## 2022-11-01 NOTE — Progress Notes (Signed)
CT chest again showed stable nodules, which is good news. Follow up with Dr. Wynona Neat as scheduled.

## 2022-11-04 NOTE — Progress Notes (Deleted)
I saw Nicole Rasmussen in neurology clinic on 11/11/22 in follow up for left neck and arm pain, neuropathy, and B12 deficiency.  HPI: Nicole Rasmussen is a 62 y.o. year old female with a history of B12 deficiency, OA c/b joint pain and back pain, GERD, HLD, prediabetes, vit D deficiency  who we last saw on 04/06/22.  To briefly review: Initial consultation 12/17/21: Patient is having tingling in her left neck, arm, and leg. When she has back pain, it is also on the left. The tingling in the arms started in the last 6 months. It was coming and going until about 2 months ago when it became constant. It is a shock like sensation. It radiates from her neck into all of her fingers. Bending her neck (like when shampooing at the hair salon) makes the sensations worse. The pain is particularly uncomfortable at night and can wake her up. She can straighten her arm and it will help sometimes. She sleeps on her side, but tries to avoid her left side. She often has her arms curled up. The pain can be 10/10, but averages 6/10. It occasionally can complete go away. She occasionally gets similar symptoms on the right but very rare. She denies weakness. She does have pain in her right knee that makes walking difficult at times.    She takes gabapentin 400 mg QID (given by PCP). It helps knock down the pain along with diclofenac, flexeril, and tylenol. She has previously tried PT (many years ago) for her back and this did not help much.   She also endorses tingling in both feet (maybe right more than left). She does not currently have this tingling though.   Patient was seen by Dr. Leone Payor in GI for abdominal pain of unknown origin. The clinic note from 09/17/21 was reviewed. Patient had previously been seen by [facility], but wanted second opinion for her pain, so consult to [facility] neurology was placed.    Patient previously saw Dr. Frances Furbish at [facility] (last seen 05/06/20). The clinic notes  indicate patient had burning/tingling sensations in her right thigh. She also described radiating pain from lower back and a long standing history of low back pain. Per 12/15/15 office note:  "she had a lumbar spine MRI without contrast at [facility] on 09/27/2013: Depression: This is an abnormal MRI of the lumbar spine showing degenerative changes at L3-L4, L4-L5, and L5-S1 as detailed above. The post significant findings are at L5-S1 were severe facet hypertrophy causes moderate left foraminal narrowing that could lead to dynamic impingement of the left L5 nerve root." Patient had EMG on 01/27/16. Per clinic note on 04/18/16, the impression was:  This NCV/EMG study showed the following: 1.    No evidence of polyneuropathy.    2.    Mild chronic right L5 radiculopathy without active features. 3.    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   MRI of lumbar spine showed degenerative changes so patient was referred to spine. She saw Dr. Roda Shutters in othropedics. The note from 10/10/20 indicates she was seen for low back pain. Patient was referred to PT, given diclofenac, and recommended to lose weight.   EtOH use: Socially, beer or wine (1-2 per day; 4 times per week)  Restrictive diet? No Family history of neuropathy/myopathy/NM disease? None  04/06/22: Labs showed a borderline low B12 level (284). EMG of the LUE was consistent with upper limb manifestations of PN with no evidence of  cervical radiculopathy or median or ulnar mononeuropathy.   Patient's pain is getting worse in her neck. It is still on the left. When she tilts her head back, she gets pain in the left neck and her arm tingling. She takes ibuprofen for her knee, but this also helps with shoulder. She is also on flexeril and meloxicam for her neck discomfort.   She takes gabapentin after work and again before bed usually. She takes 400 mg 2-3 times per day. It helps. She is happy with the relief she  gets at night. She is not able to take gabapentin during the day because it makes her too sleepy.    She went to PT. She went to 6-7 sessions. This did not help, so she stopped going. She was doing home exercises, but this did not help.    She has also gotten a TENS unit, but this has also not helped much.  Most recent Assessment and Plan (04/06/22): This is Nicole Rasmussen, a 62 y.o. female with:   Left neck, shoulder pain with radiating numbness and tingling - worse with tilting head backward or certain positions. Recent EMG without evidence of cervical radiculopathy, but previous MRI cervical spine (2012) showed concern for effacement of left cord (~C5-6). The worsening symptoms could be evolution of this pathology. 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 Continue gabapentin 400 mg twice nightly with flexeril and meloxicam -During day gabapentin and flexeril makes patient too sleepy to take. Will start lower dose gabapentin 100 mg twice during the day. 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 Patient was referred to neurosurgery given worsening pain. She saw Dr. Wynetta Emery at [facility] who recommend surgery given failure of conservative treatment. ***  ROS: Pertinent positive and negative systems reviewed in HPI. ***   MEDICATIONS:  Outpatient Encounter Medications as of 11/11/2022  Medication Sig   albuterol (VENTOLIN HFA) 108 (90 Base) MCG/ACT inhaler Inhale 2 puffs into the lungs every 4 (four) hours as needed for wheezing or shortness of breath.   aspirin EC 81 MG  tablet Take 81 mg by mouth daily.   atorvastatin (LIPITOR) 10 MG tablet Take 10 mg by mouth daily.   cyanocobalamin (VITAMIN B12) 1000 MCG tablet Take 1,000 mcg by mouth daily. (Patient not taking: Reported on 08/19/2022)   cyclobenzaprine (FLEXERIL) 10 MG tablet Take 10 mg by mouth 3 (three) times daily as needed for muscle spasms.   diclofenac Sodium (VOLTAREN) 1 % GEL    EPINEPHrine (EPIPEN 2-PAK) 0.3 mg/0.3 mL IJ SOAJ injection Inject 0.3 mg into the muscle as needed for anaphylaxis.   gabapentin (NEURONTIN) 100 MG capsule Take 1 capsule (100 mg total) by mouth 2 (two) times daily.   gabapentin (NEURONTIN) 400 MG capsule Take 1 capsule by mouth 3 (three) times daily as needed.   ibuprofen (ADVIL) 800 MG tablet Take 1 tablet (800 mg total) by mouth 3 (three) times daily as needed for moderate pain.   loratadine (CLARITIN) 10 MG tablet Take 1 tablet (10 mg total) by mouth daily.   olmesartan (BENICAR) 20 MG tablet Take 20 mg by mouth daily.   potassium  chloride (MICRO-K) 10 MEQ CR capsule Take 1 capsule by mouth 2 (two) times daily. (Patient not taking: Reported on 08/19/2022)   RYALTRIS 665-25 MCG/ACT SUSP Place 2 sprays into the nose 2 (two) times daily.   No facility-administered encounter medications on file as of 11/11/2022.    PAST MEDICAL HISTORY: Past Medical History:  Diagnosis Date   Acid reflux    Allergy    Anemia    Arthritis    Asthma    no inhalers   B12 deficiency    Dysmetabolic syndrome X    Dyspnea    Food allergy    shellfish   GERD (gastroesophageal reflux disease)    HLD (hyperlipidemia)    Joint pain    knees and back   Lower back pain    Nerve pain    top of right thigh   Neuromuscular disorder (HCC)    bilateral leg pain from back issue   Prediabetes    Shortness of breath    Sinus problem    Swallowing difficulty    Vitamin B 12 deficiency    Vitamin D deficiency     PAST SURGICAL HISTORY: Past Surgical History:  Procedure Laterality Date    CESAREAN SECTION     1 time   COLONOSCOPY     ESOPHAGOGASTRODUODENOSCOPY     FOOT SURGERY Bilateral 1980's   bunionectomy and hammer toe on both feet   MOUTH SURGERY  2011   had implants/ root canal/bridges   WISDOM TOOTH EXTRACTION     with the implants    ALLERGIES: Allergies  Allergen Reactions   Shellfish Allergy Hives and Swelling   Latex Hives    hives   Sulfonamide Derivatives Other (See Comments)    Rash, itching    FAMILY HISTORY: Family History  Problem Relation Age of Onset   Cancer Mother    Hypertension Mother    Hyperlipidemia Mother    Heart disease Mother    Breast cancer Mother    Cancer Father    Kidney disease Father    Prostate cancer Father    Hyperlipidemia Sister    Diabetes Sister    Hypertension Sister    Hypertension Sister    Hypertension Sister    Hypertension Sister    Hyperlipidemia Brother    Hypertension Brother    Prostate cancer Brother    Hypertension Brother    Hypertension Brother    Hypertension Brother    Colon cancer Neg Hx    Esophageal cancer Neg Hx    Pancreatic cancer Neg Hx    Stomach cancer Neg Hx     SOCIAL HISTORY: Social History   Tobacco Use   Smoking status: Former    Packs/day: 1.00    Years: 20.00    Additional pack years: 0.00    Total pack years: 20.00    Types: Cigarettes    Quit date: 05/02/1996    Years since quitting: 26.5    Passive exposure: Never   Smokeless tobacco: Never  Vaping Use   Vaping Use: Never used  Substance Use Topics   Alcohol use: Yes    Comment: social   Drug use: No   Social History   Social History Narrative   Occasional caffeine use    Right handed    Live in a one level home husband and daughter when home from school   Duty [employer]     Objective:  Vital Signs:  LMP 10/11/2010   General:*** General appearance:  Awake and alert. No distress. Cooperative with exam.  Skin: No obvious rash or jaundice. HEENT: Atraumatic. Anicteric. Lungs: Non-labored  breathing on room air  Heart: Regular Abdomen: Soft, non tender. Extremities: No edema. No obvious deformity.  Musculoskeletal: No obvious joint swelling.  Neurological: Mental Status: Alert. Speech fluent. No pseudobulbar affect Cranial Nerves: CNII: No RAPD. Visual fields intact. CNIII, IV, VI: PERRL. No nystagmus. EOMI. CN V: Facial sensation intact bilaterally to fine touch. [place] clench strong. Jaw jerk***. CN VII: Facial muscles symmetric and strong. No ptosis at rest or after sustained upgaze***. CN VIII: Hears finger rub well bilaterally. CN IX: No hypophonia. CN X: Palate elevates symmetrically. CN XI: Full strength shoulder shrug bilaterally. CN XII: Tongue protrusion full and midline. No atrophy or fasciculations. No significant dysarthria*** Motor: Tone is ***. *** fasciculations in *** extremities. *** atrophy. No grip or percussive myotonia.  Individual muscle group testing (MRC grade out of 5):  Movement     Neck flexion ***    Neck extension ***     Right Left   Shoulder abduction *** ***   Shoulder adduction *** ***   Shoulder ext rotation *** ***   Shoulder int rotation *** ***   Elbow flexion *** ***   Elbow extension *** ***   Wrist extension *** ***   Wrist flexion *** ***   Finger abduction - FDI *** ***   Finger abduction - ADM *** ***   Finger extension *** ***   Finger distal flexion - 2/3 *** ***   Finger distal flexion - 4/5 *** ***   Thumb flexion - FPL *** ***   Thumb abduction - APB *** ***    Hip flexion *** ***   Hip extension *** ***   Hip adduction *** ***   Hip abduction *** ***   Knee extension *** ***   Knee flexion *** ***   Dorsiflexion *** ***   Plantarflexion *** ***   Inversion *** ***   Eversion *** ***   Great toe extension *** ***   Great toe flexion *** ***     Reflexes:  Right Left  Bicep *** ***  Tricep *** ***  BrRad *** ***  Knee *** ***  Ankle *** ***   Pathological Reflexes: Babinski: ***  response bilaterally*** Hoffman: *** Troemner: *** Pectoral: *** Palmomental: *** Facial: *** Midline tap: *** Sensation: Pinprick: *** Vibration: *** Temperature: *** Proprioception: *** Coordination: Intact finger-to- nose-finger and heel-to-shin bilaterally. Romberg negative.*** Gait: Able to rise from chair with arms crossed unassisted. Normal, narrow-based gait. Able to tandem walk. Able to walk on toes and heels.***   Lab and Test Review: New results: MRI cervical spine wo contrast (05/14/22): FINDINGS: Alignment: Normal   Vertebrae: Normal marrow signal. No bone lesions or fractures.   Cord: Normal cord signal intensity. No cord lesions or syrinx.   Posterior Fossa, vertebral arteries, paraspinal tissues: No significant findings.   Disc levels:   C2-3: No significant findings.   C3-4: Very shallow central disc protrusion but no neural compression. No foraminal stenosis.   C4-5: No significant findings.   C5-6: Diffuse bulging annulus and mild osteophytic ridging with flattening of the ventral thecal sac and mild narrowing of the ventral CSF space. There is also a left foraminal disc osteophyte complex with significant left foraminal stenosis likely involving the left C6 nerve root and responsible for the patient's left radicular symptoms.   C6-7: Minimal disc bulge but no significant disc protrusions, spinal or  foraminal stenosis.   C7-T1: No significant findings. Slightly dilated exiting C8 nerve root sheaths purses perineural cysts. No significant findings.   IMPRESSION: 1. Left foraminal disc osteophyte complex at C5-6 with significant left foraminal stenosis likely responsible for the patient's left radicular symptoms. 2. Very shallow central disc protrusion at C3-4 but no neural compression. 3. Normal MR appearance of the cervical spinal cord.  Previously reviewed results: 12/17/21: B12 284, IFE and SPEP with no M protein  HbA1c (08/2019):  6.3 TSH: 1.44   EMG (01/31/22): NCV & EMG Findings: Extensive electrodiagnostic evaluation of the left upper limb with additional nerve conduction studies of the right upper limb shows: Absent median and ulnar sensory responses bilaterally. The left radial sensory response is within normal limits. Bilateral medial antebrachial cutaneous sensory responses are absent and bilateral lateral antebrachial cutaneous sensory response show reduced amplitude (L8, R8 V). Left median (APB) and left ulnar (ADM) motor responses are within normal limits. There is no evidence of active or chronic motor axon loss changes affecting any of the tested muscles. Motor unit configuration and recruitment pattern is within normal limits.   Impression: This is an abnormal study of the left upper limb. 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  The absent bilateral medial antebrachial cutaneous sensory responses and low amplitude bilateral lateral antebrachial cutaneous sensory response are likely technical in nature and not due to bilateral plexopathy given the normal needle examination.  There is no evidence of a left cervical (C5-T1) motor radiculopathy.  There is no definitive evidence of a left median mononeuropathy at or distal to the wrist (ie no evidence of left carpal tunnel syndrome).   MRI lumbar spine (11/17/16): FINDINGS:  The lumbar vertebrae demonstrate normal alignment, body height and marrow signal. T12-L1 and L1-L2show only minor facet hypertrophy without compression.L2-3 shows asymmetric facet hypertrophy resulting in mild left narrowing but no compression.L3-4 shows severe lateral disc osteophyte bulge to the left and  asymmetric facet hypertrophy resulting in severe foraminal  narrowing on left and likely impingement on the exiting nerve root on left.L4-5 also shows similar changes but moderate narrowing the left foramina.Marland Kitchen L5-S1 also shows asymmetric facet hypertrophy with severe left sided foraminall narrowing with likely impingement of the exiting nerve root.the conus medullaris terminates at L1. The visualized lower thoracic vertebrae and paraspinal soft tissue appear unremarkable.    MRI cervical spine (08/28/10): Findings: There is no abnormality at the foramen magnum, C1-2, C2-  3, C3-4 or C4-5.  The discs are normal.  The canal and foramina are  widely patent.  No osseous or articular finding.   At C5-6, there is spondylosis with endplate osteophytes and shallow  broad-based herniation more towards the left.  This effaces the  ventral subarachnoid space indents the cord slightly.  No foraminal  extension is seen.   C6-7, C7-T1 and T1-2 are normal.   IMPRESSION:  Single level pathology at C5-6, there is disc degeneration with  endplate osteophytes and shallow left posterolateral predominant  disc herniation.  This effaces the ventral subarachnoid space and  indents the cord slightly on the left.  No foraminal extension is  detected.    EMG (01/27/16 at [facility]): 1.    No evidence of polyneuropathy.    2.    Mild chronic right L5 radiculopathy without active  features. 3.    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  ASSESSMENT: This is Nicole Rasmussen, a 62 y.o. female with:  ***  Plan: ***  Return to clinic in ***  Total time spent reviewing records, interview, history/exam, documentation, and coordination of care on day of encounter:  *** min  Jacquelyne Balint, MD

## 2022-11-11 ENCOUNTER — Encounter: Payer: Self-pay | Admitting: Neurology

## 2022-11-11 ENCOUNTER — Ambulatory Visit: Payer: 59 | Admitting: Neurology

## 2022-11-11 DIAGNOSIS — Z029 Encounter for administrative examinations, unspecified: Secondary | ICD-10-CM

## 2023-01-25 ENCOUNTER — Other Ambulatory Visit: Payer: Self-pay

## 2023-01-25 DIAGNOSIS — R911 Solitary pulmonary nodule: Secondary | ICD-10-CM

## 2023-01-26 ENCOUNTER — Ambulatory Visit: Payer: 59 | Admitting: Pulmonary Disease

## 2023-01-26 DIAGNOSIS — R911 Solitary pulmonary nodule: Secondary | ICD-10-CM | POA: Diagnosis not present

## 2023-01-26 LAB — PULMONARY FUNCTION TEST
DL/VA % pred: 108 %
DL/VA: 4.42 ml/min/mmHg/L
DLCO cor % pred: 89 %
DLCO cor: 20.23 ml/min/mmHg
DLCO unc % pred: 89 %
DLCO unc: 20.23 ml/min/mmHg
FEF 25-75 Post: 2.79 L/sec
FEF 25-75 Pre: 2.86 L/sec
FEF2575-%Change-Post: -2 %
FEF2575-%Pred-Post: 115 %
FEF2575-%Pred-Pre: 118 %
FEV1-%Change-Post: -1 %
FEV1-%Pred-Post: 79 %
FEV1-%Pred-Pre: 81 %
FEV1-Post: 2.26 L
FEV1-Pre: 2.3 L
FEV1FVC-%Change-Post: 3 %
FEV1FVC-%Pred-Pre: 109 %
FEV6-%Change-Post: -4 %
FEV6-%Pred-Post: 72 %
FEV6-%Pred-Pre: 76 %
FEV6-Post: 2.58 L
FEV6-Pre: 2.71 L
FEV6FVC-%Pred-Post: 103 %
FEV6FVC-%Pred-Pre: 103 %
FVC-%Change-Post: -4 %
FVC-%Pred-Post: 69 %
FVC-%Pred-Pre: 73 %
FVC-Post: 2.58 L
FVC-Pre: 2.71 L
Post FEV1/FVC ratio: 87 %
Post FEV6/FVC ratio: 100 %
Pre FEV1/FVC ratio: 85 %
Pre FEV6/FVC Ratio: 100 %
RV % pred: 90 %
RV: 2.04 L
TLC % pred: 91 %
TLC: 5.18 L

## 2023-01-26 NOTE — Patient Instructions (Signed)
Full PFT performed today. °

## 2023-01-26 NOTE — Progress Notes (Signed)
Full PFT performed today. °

## 2023-01-27 ENCOUNTER — Encounter: Payer: Self-pay | Admitting: Pulmonary Disease

## 2023-01-27 ENCOUNTER — Ambulatory Visit: Payer: 59 | Admitting: Pulmonary Disease

## 2023-01-27 VITALS — BP 150/94 | HR 100 | Temp 97.3°F | Ht 68.0 in | Wt 262.0 lb

## 2023-01-27 DIAGNOSIS — R0609 Other forms of dyspnea: Secondary | ICD-10-CM | POA: Diagnosis not present

## 2023-01-27 NOTE — Patient Instructions (Signed)
For the shortness of breath We will get an ultrasound of your heart The one you had done about 2 years ago was within normal limits  Your breathing study is relatively normal and should not be causing you significant shortness of breath  Continue albuterol as needed  Graded exercises as you can tolerate with commitment to trying to exercise more  Follow-up in 3 months

## 2023-03-09 ENCOUNTER — Ambulatory Visit (HOSPITAL_COMMUNITY)
Admission: RE | Admit: 2023-03-09 | Discharge: 2023-03-09 | Disposition: A | Discharge status: Home / Self Care | Payer: 59 | Source: Ambulatory Visit | Attending: Pulmonary Disease | Admitting: Pulmonary Disease

## 2023-03-09 DIAGNOSIS — R0609 Other forms of dyspnea: Secondary | ICD-10-CM

## 2023-03-09 DIAGNOSIS — J45909 Unspecified asthma, uncomplicated: Secondary | ICD-10-CM | POA: Insufficient documentation

## 2023-03-09 DIAGNOSIS — I517 Cardiomegaly: Secondary | ICD-10-CM | POA: Insufficient documentation

## 2023-03-09 DIAGNOSIS — R0600 Dyspnea, unspecified: Secondary | ICD-10-CM | POA: Insufficient documentation

## 2023-03-09 DIAGNOSIS — I700 Atherosclerosis of aorta: Secondary | ICD-10-CM | POA: Diagnosis not present

## 2023-03-09 LAB — ECHOCARDIOGRAM COMPLETE
AR max vel: 3.25 cm2
AV Area VTI: 3 cm2
AV Area mean vel: 3.08 cm2
AV Mean grad: 5 mm[Hg]
AV Peak grad: 9.7 mm[Hg]
Ao pk vel: 1.56 m/s
Area-P 1/2: 3.85 cm2
Calc EF: 58.8 %
S' Lateral: 2.5 cm
Single Plane A2C EF: 56.4 %
Single Plane A4C EF: 60.5 %

## 2023-03-12 NOTE — Progress Notes (Signed)
Nicole Rasmussen    [id number]    14-Mar-1961  Primary Care Physician:Smith, Paula Compton, MD  Referring Physician: Caffie Damme, MD [address] [place],  [place] [address]  Chief complaint:    Patient being seen for shortness of breath on exertion  HPI:  Shortness of breath on exertion Past history of asthma  Was seen in 2022 with a lung nodule, PET scan at the time showed no significant uptake  Reformed smoker  No personal history of cancer, no family history of cancer  No pertinent occupational history  Rare use of albuterol  She does have a history of GERD, chronic neck and back pain, obesity, prediabetes, asthma, osteoarthritis   Outpatient Encounter Medications as of 01/27/2023  Medication Sig   albuterol (VENTOLIN HFA) 108 (90 Base) MCG/ACT inhaler Inhale 2 puffs into the lungs every 4 (four) hours as needed for wheezing or shortness of breath.   aspirin EC 81 MG tablet Take 81 mg by mouth daily.   atorvastatin (LIPITOR) 10 MG tablet Take 10 mg by mouth daily.   cyclobenzaprine (FLEXERIL) 10 MG tablet Take 10 mg by mouth 3 (three) times daily as needed for muscle spasms.   diclofenac Sodium (VOLTAREN) 1 % GEL    EPINEPHrine (EPIPEN 2-PAK) 0.3 mg/0.3 mL IJ SOAJ injection Inject 0.3 mg into the muscle as needed for anaphylaxis.   gabapentin (NEURONTIN) 100 MG capsule Take 1 capsule (100 mg total) by mouth 2 (two) times daily.   ibuprofen (ADVIL) 800 MG tablet Take 1 tablet (800 mg total) by mouth 3 (three) times daily as needed for moderate pain.   loratadine (CLARITIN) 10 MG tablet Take 1 tablet (10 mg total) by mouth daily.   olmesartan (BENICAR) 20 MG tablet Take 20 mg by mouth daily.   RYALTRIS [id number] MCG/ACT SUSP Place 2 sprays into the nose 2 (two) times daily.   [DISCONTINUED] cyanocobalamin (VITAMIN B12) 1000 MCG tablet Take 1,000 mcg by mouth daily. (Patient not taking: Reported on 01/27/2023)   [DISCONTINUED] gabapentin (NEURONTIN) 400 MG capsule Take  1 capsule by mouth 3 (three) times daily as needed. (Patient not taking: Reported on 01/27/2023)   [DISCONTINUED] potassium chloride (MICRO-K) 10 MEQ CR capsule Take 1 capsule by mouth 2 (two) times daily. (Patient not taking: Reported on 01/27/2023)   No facility-administered encounter medications on file as of 01/27/2023.    Allergies as of 01/27/2023 - Review Complete 01/27/2023  Allergen Reaction Noted   Shellfish allergy Hives and Swelling 05/19/2013   Latex Hives 12/15/2015   Sulfonamide derivatives Other (See Comments)     Past Medical History:  Diagnosis Date   Acid reflux    Allergy    Anemia    Arthritis    Asthma    no inhalers   B12 deficiency    Dysmetabolic syndrome X    Dyspnea    Food allergy    shellfish   GERD (gastroesophageal reflux disease)    HLD (hyperlipidemia)    Joint pain    knees and back   Lower back pain    Nerve pain    top of right thigh   Neuromuscular disorder (HCC)    bilateral leg pain from back issue   Prediabetes    Shortness of breath    Sinus problem    Swallowing difficulty    Vitamin B 12 deficiency    Vitamin D deficiency     Past Surgical History:  Procedure Laterality Date  CESAREAN SECTION     1 time   COLONOSCOPY     ESOPHAGOGASTRODUODENOSCOPY     FOOT SURGERY Bilateral 1980's   bunionectomy and hammer toe on both feet   MOUTH SURGERY  2011   had implants/ root canal/bridges   WISDOM TOOTH EXTRACTION     with the implants    Family History  Problem Relation Age of Onset   Cancer Mother    Hypertension Mother    Hyperlipidemia Mother    Heart disease Mother    Breast cancer Mother    Cancer Father    Kidney disease Father    Prostate cancer Father    Hyperlipidemia Sister    Diabetes Sister    Hypertension Sister    Hypertension Sister    Hypertension Sister    Hypertension Sister    Hyperlipidemia Brother    Hypertension Brother    Prostate cancer Brother    Hypertension Brother    Hypertension  Brother    Hypertension Brother    Colon cancer Neg Hx    Esophageal cancer Neg Hx    Pancreatic cancer Neg Hx    Stomach cancer Neg Hx     Social History   Socioeconomic History   Marital status: Married    Spouse name: Ivar Drape   Number of children: 1   Years of education: BA   Highest education level: Not on file  Occupational History   Occupation: Retired, Lexicographer: [employer]  Tobacco Use   Smoking status: Former    Current packs/day: 0.00    Average packs/day: 1 pack/day for 20.0 years (20.0 ttl pk-yrs)    Types: Cigarettes    Start date: 05/02/1976    Quit date: 05/02/1996    Years since quitting: 26.8    Passive exposure: Never   Smokeless tobacco: Never  Vaping Use   Vaping status: Never Used  Substance and Sexual Activity   Alcohol use: Yes    Comment: social   Drug use: No   Sexual activity: Yes    Birth control/protection: Post-menopausal  Other Topics Concern   Not on file  Social History Narrative   Occasional caffeine use    Right handed    Live in a one level home husband and daughter 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     Vitals:   01/27/23 0854  BP: (!) 150/94  Pulse: 100  Temp: (!) 97.3 F (36.3 C)  SpO2: 98%     Physical Exam Constitutional:      Appearance: She is obese.  HENT:     Head: Normocephalic.     Mouth/Throat:     Mouth: Mucous membranes are moist.  Eyes:     General: No scleral icterus. Cardiovascular:     Rate and Rhythm: Normal rate and regular rhythm.     Heart sounds: No murmur heard.    No friction rub.  Pulmonary:     Effort: No respiratory  distress.     Breath sounds: No stridor. No wheezing or rhonchi.  Musculoskeletal:     Cervical back:  No rigidity or tenderness.  Neurological:     Mental Status: She is alert.  Psychiatric:        Mood and Affect: Mood normal.      Data Reviewed: Pulmonary function test reviewed with the patient showing no significant obstruction, no significant bronchodilator response, no restriction with normal diffusing capacity  Assessment:  Dyspnea on exertion -Likely multifactorial -May be due to some deconditioning  Hypertension -Optimize blood pressure medications  History of asthma -Albuterol use as needed   Plan/Recommendations: Obtain echocardiogram  Albuterol as needed  Graded activities as tolerated  Follow-up in about 3 months   Virl Diamond MD [facility] 03/12/2023, 6:52 AM  CC: Caffie Damme, MD

## 2023-03-31 ENCOUNTER — Other Ambulatory Visit: Payer: Self-pay | Admitting: Allergy

## 2023-04-14 ENCOUNTER — Other Ambulatory Visit: Payer: Self-pay | Admitting: Allergy

## 2023-04-28 ENCOUNTER — Encounter: Payer: Self-pay | Admitting: Pulmonary Disease

## 2023-04-28 ENCOUNTER — Ambulatory Visit: Payer: 59 | Admitting: Pulmonary Disease

## 2023-04-28 VITALS — BP 129/86 | HR 85 | Ht 68.0 in | Wt 265.6 lb

## 2023-04-28 DIAGNOSIS — Z8709 Personal history of other diseases of the respiratory system: Secondary | ICD-10-CM | POA: Diagnosis not present

## 2023-04-28 DIAGNOSIS — R0609 Other forms of dyspnea: Secondary | ICD-10-CM | POA: Diagnosis not present

## 2023-04-28 DIAGNOSIS — R911 Solitary pulmonary nodule: Secondary | ICD-10-CM

## 2023-04-28 NOTE — Patient Instructions (Signed)
The ultrasound of your heart looks normal-there is a plaque in the aorta -This suggests that one should focus on risk modification for heart disease  Your breathing study was also relatively normal as we reviewed today  Continue with graded activities as tolerated  Discuss with your primary doctor regarding risk profile modification for heart disease -Calcium scoring may be needed  Will see you in 6 months

## 2023-04-28 NOTE — Progress Notes (Unsigned)
Nicole Rasmussen    [id number]    11-Feb-1961  Primary Care Physician:Smith, Paula Compton, MD  Referring Physician: Caffie Damme, MD [address] [place],  [place] [address]  Chief complaint:    Patient being seen for shortness of breath on exertion  HPI:  Shortness of breath on exertion Past history of asthma  Was seen in 2022 with a lung nodule, PET scan at the time showed no significant uptake  Reformed smoker  No personal history of cancer, no family history of cancer  No pertinent occupational history  Rare use of albuterol  She does have a history of GERD, chronic neck and back pain, obesity, prediabetes, asthma, osteoarthritis   Outpatient Encounter Medications as of 04/28/2023  Medication Sig   albuterol (VENTOLIN HFA) 108 (90 Base) MCG/ACT inhaler INHALE 2 PUFFS INTO THE LUNGS EVERY 4 HOURS AS NEEDED FOR WHEEZING OR SHORTNESS OF BREATH   aspirin EC 81 MG tablet Take 81 mg by mouth daily.   atorvastatin (LIPITOR) 10 MG tablet Take 10 mg by mouth daily.   cyclobenzaprine (FLEXERIL) 10 MG tablet Take 10 mg by mouth 3 (three) times daily as needed for muscle spasms.   diclofenac Sodium (VOLTAREN) 1 % GEL    EPINEPHrine (EPIPEN 2-PAK) 0.3 mg/0.3 mL IJ SOAJ injection Inject 0.3 mg into the muscle as needed for anaphylaxis.   gabapentin (NEURONTIN) 100 MG capsule Take 1 capsule (100 mg total) by mouth 2 (two) times daily.   ibuprofen (ADVIL) 800 MG tablet Take 1 tablet (800 mg total) by mouth 3 (three) times daily as needed for moderate pain.   loratadine (CLARITIN) 10 MG tablet Take 1 tablet (10 mg total) by mouth daily.   olmesartan (BENICAR) 20 MG tablet Take 20 mg by mouth daily.   RYALTRIS [id number] MCG/ACT SUSP Place 2 sprays into the nose 2 (two) times daily.   No facility-administered encounter medications on file as of 04/28/2023.    Allergies as of 04/28/2023 - Review Complete 04/28/2023  Allergen Reaction Noted   Shellfish allergy Hives and Swelling  05/19/2013   Latex Hives 12/15/2015   Sulfonamide derivatives Other (See Comments)     Past Medical History:  Diagnosis Date   Acid reflux    Allergy    Anemia    Arthritis    Asthma    no inhalers   B12 deficiency    Dysmetabolic syndrome X    Dyspnea    Food allergy    shellfish   GERD (gastroesophageal reflux disease)    HLD (hyperlipidemia)    Joint pain    knees and back   Lower back pain    Nerve pain    top of right thigh   Neuromuscular disorder (HCC)    bilateral leg pain from back issue   Prediabetes    Shortness of breath    Sinus problem    Swallowing difficulty    Vitamin B 12 deficiency    Vitamin D deficiency     Past Surgical History:  Procedure Laterality Date   CESAREAN SECTION     1 time   COLONOSCOPY     ESOPHAGOGASTRODUODENOSCOPY     FOOT SURGERY Bilateral 1980's   bunionectomy and hammer toe on both feet   MOUTH SURGERY  2011   had implants/ root canal/bridges   WISDOM TOOTH EXTRACTION     with the implants    Family History  Problem Relation Age of Onset   Cancer  Mother    Hypertension Mother    Hyperlipidemia Mother    Heart disease Mother    Breast cancer Mother    Cancer Father    Kidney disease Father    Prostate cancer Father    Hyperlipidemia Sister    Diabetes Sister    Hypertension Sister    Hypertension Sister    Hypertension Sister    Hypertension Sister    Hyperlipidemia Brother    Hypertension Brother    Prostate cancer Brother    Hypertension Brother    Hypertension Brother    Hypertension Brother    Colon cancer Neg Hx    Esophageal cancer Neg Hx    Pancreatic cancer Neg Hx    Stomach cancer Neg Hx     Social History   Socioeconomic History   Marital status: Married    Spouse name: Ivar Drape   Number of children: 1   Years of education: BA   Highest education level: Not on file  Occupational History   Occupation: Retired, Lexicographer: [employer]  Tobacco Use   Smoking status:  Former    Current packs/day: 0.00    Average packs/day: 1 pack/day for 20.0 years (20.0 ttl pk-yrs)    Types: Cigarettes    Start date: 05/02/1976    Quit date: 05/02/1996    Years since quitting: 27.0    Passive exposure: Never   Smokeless tobacco: Never  Vaping Use   Vaping status: Never Used  Substance and Sexual Activity   Alcohol use: Yes    Comment: social   Drug use: No   Sexual activity: Yes    Birth control/protection: Post-menopausal  Other Topics Concern   Not on file  Social History Narrative   Occasional caffeine use    Right handed    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     Vitals:   04/28/23 0834  BP: 129/86  Pulse: 85  SpO2: 97%     Physical Exam Constitutional:      Appearance: She is obese.  HENT:     Head: Normocephalic.     Mouth/Throat:     Mouth: Mucous membranes are moist.  Eyes:     General: No scleral icterus. Cardiovascular:     Rate and Rhythm: Normal rate and regular rhythm.     Heart sounds: No murmur heard.    No friction rub.  Pulmonary:     Effort: No respiratory distress.     Breath sounds: No stridor. No wheezing or rhonchi.  Musculoskeletal:     Cervical back: No rigidity or tenderness.  Neurological:     Mental Status: She is alert.  Psychiatric:        Mood and Affect: Mood normal.      Data Reviewed: Pulmonary function test reviewed with the patient showing no significant obstruction, no significant bronchodilator response, no restriction with normal diffusing capacity  Assessment:  Dyspnea on exertion -Likely multifactorial -May be  due to some deconditioning  Hypertension -Optimize blood pressure medications  History  of asthma -Albuterol use as needed   Plan/Recommendations: Obtain echocardiogram  Albuterol as needed  Graded activities as tolerated  Follow-up in about 3 months   Virl Diamond MD [facility] 04/28/2023, 8:52 AM  CC: Caffie Damme, MD

## 2023-05-03 ENCOUNTER — Other Ambulatory Visit: Payer: Self-pay | Admitting: Neurology

## 2023-05-03 DIAGNOSIS — R200 Anesthesia of skin: Secondary | ICD-10-CM

## 2023-05-03 DIAGNOSIS — R209 Unspecified disturbances of skin sensation: Secondary | ICD-10-CM

## 2023-05-04 ENCOUNTER — Emergency Department (HOSPITAL_BASED_OUTPATIENT_CLINIC_OR_DEPARTMENT_OTHER): Payer: 59

## 2023-05-04 ENCOUNTER — Encounter (HOSPITAL_BASED_OUTPATIENT_CLINIC_OR_DEPARTMENT_OTHER): Payer: Self-pay | Admitting: Urology

## 2023-05-04 ENCOUNTER — Emergency Department (HOSPITAL_BASED_OUTPATIENT_CLINIC_OR_DEPARTMENT_OTHER)
Admission: EM | Admit: 2023-05-04 | Discharge: 2023-05-04 | Disposition: A | Discharge status: Home / Self Care | Payer: 59 | Attending: Emergency Medicine | Admitting: Emergency Medicine

## 2023-05-04 ENCOUNTER — Other Ambulatory Visit: Payer: Self-pay

## 2023-05-04 DIAGNOSIS — R42 Dizziness and giddiness: Secondary | ICD-10-CM | POA: Diagnosis not present

## 2023-05-04 DIAGNOSIS — Z7982 Long term (current) use of aspirin: Secondary | ICD-10-CM | POA: Diagnosis not present

## 2023-05-04 DIAGNOSIS — R1310 Dysphagia, unspecified: Secondary | ICD-10-CM | POA: Diagnosis not present

## 2023-05-04 DIAGNOSIS — R1011 Right upper quadrant pain: Secondary | ICD-10-CM | POA: Insufficient documentation

## 2023-05-04 DIAGNOSIS — Z91040 Latex allergy status: Secondary | ICD-10-CM | POA: Insufficient documentation

## 2023-05-04 DIAGNOSIS — R519 Headache, unspecified: Secondary | ICD-10-CM | POA: Insufficient documentation

## 2023-05-04 DIAGNOSIS — R0602 Shortness of breath: Secondary | ICD-10-CM | POA: Diagnosis not present

## 2023-05-04 DIAGNOSIS — R1084 Generalized abdominal pain: Secondary | ICD-10-CM

## 2023-05-04 LAB — URINALYSIS, ROUTINE W REFLEX MICROSCOPIC
Bilirubin Urine: NEGATIVE
Glucose, UA: NEGATIVE mg/dL
Ketones, ur: 40 mg/dL — AB
Leukocytes,Ua: NEGATIVE
Nitrite: NEGATIVE
Protein, ur: NEGATIVE mg/dL
Specific Gravity, Urine: 1.01 (ref 1.005–1.030)
pH: 6 (ref 5.0–8.0)

## 2023-05-04 LAB — COMPREHENSIVE METABOLIC PANEL
ALT: 30 U/L (ref 0–44)
AST: 25 U/L (ref 15–41)
Albumin: 4.1 g/dL (ref 3.5–5.0)
Alkaline Phosphatase: 72 U/L (ref 38–126)
Anion gap: 7 (ref 5–15)
BUN: 14 mg/dL (ref 8–23)
CO2: 23 mmol/L (ref 22–32)
Calcium: 9 mg/dL (ref 8.9–10.3)
Chloride: 106 mmol/L (ref 98–111)
Creatinine, Ser: 0.67 mg/dL (ref 0.44–1.00)
GFR, Estimated: >60 mL/min (ref >60)
Glucose, Bld: 88 mg/dL (ref 70–99)
Potassium: 3.3 mmol/L — ABNORMAL LOW (ref 3.5–5.1)
Sodium: 136 mmol/L (ref 135–145)
Total Bilirubin: 1 mg/dL (ref 0.0–1.2)
Total Protein: 7.6 g/dL (ref 6.5–8.1)

## 2023-05-04 LAB — URINALYSIS, MICROSCOPIC (REFLEX)

## 2023-05-04 LAB — TROPONIN I (HIGH SENSITIVITY)
Troponin I (High Sensitivity): 3 ng/L (ref <18)
Troponin I (High Sensitivity): 3 ng/L (ref <18)

## 2023-05-04 LAB — LIPASE, BLOOD: Lipase: 27 U/L (ref 11–51)

## 2023-05-04 MED ORDER — POTASSIUM CHLORIDE CRYS ER 20 MEQ PO TBCR
40.0000 meq | EXTENDED_RELEASE_TABLET | Freq: Once | ORAL | Status: AC
  Administered 2023-05-04: 40 meq via ORAL
  Filled 2023-05-04: qty 2

## 2023-05-04 MED ORDER — IOHEXOL 350 MG/ML SOLN
125.0000 mL | Freq: Once | INTRAVENOUS | Status: AC | PRN
  Administered 2023-05-04: 125 mL via INTRAVENOUS

## 2023-05-04 NOTE — ED Provider Notes (Signed)
 Care assumed from [place], PA-C at shift change.  Please see their note for further information.  Briefly: Patient with numerous complaints including shortness of breath and lightheadedness as well as upper abdominal pain yesterday that has since resolved.  No nausea or vomiting.  No chest pain.  Plan: labs reassuring, CTA PE study and CT abdomen pelvis ordered for further evaluation and are pending at shift change and will determine dispo.  If negative, suspect patient can be discharged with outpatient follow-up.  11:00 PM: Patient CT scans have resulted and reveal  1. No filling defect is identified in the pulmonary arterial tree to suggest pulmonary embolus. 2. 2 mm punctate nonobstructive right mid kidney calculus. 3. Mild sigmoid colon diverticulosis. 4. Left foraminal impingement at L3-4, L4-5, and L5-S1 (most severe at L5-S1). 5. Aortic atherosclerosis.   I have personally reviewed and interpreted this imaging and agree with radiology interpretation.  Upon reassessment, patient is asymptomatic and ready to go home. Her potassium is slightly low at 3.3.  I have replaced this.  Recommend she have it rechecked at her primary doctor.  Recommend discharge with close outpatient follow-up as per previous providers recommendations. Evaluation and diagnostic testing in the emergency department does not suggest an emergent condition requiring admission or immediate intervention beyond what has been performed at this time.  Plan for discharge with close PCP follow-up.  Patient is understanding and amenable with plan, educated on red flag symptoms that would prompt immediate return.  Patient discharged in stable condition.      Nora Lauraine LABOR, PA-C 05/04/23 2345    Pamella Ozell LABOR, DO 05/10/23 1353

## 2023-05-04 NOTE — ED Provider Notes (Signed)
 [place] [facility] AT [facility] Provider Note   CSN: [id number] Arrival date & time: 05/04/23  1532     History  Chief Complaint  Patient presents with   Multiple Complaints     Nicole Rasmussen is a 63 y.o. female who presents emergency department with multiple complaints.  Patient reports that she has been having trouble swallowing where she feels like food gets hung up in her throat.  She has been seen and assessed and had studies done with GI states that they cannot find anything wrong with it but I feel like something is very wrong with my body.  She is also complaining of intermittent headaches and has headaches for years.  She also feels short of breath and dizzy which she describes as lightheaded.  Patient reports that she yesterday had pain in her right upper quadrant radiating to her back that lasted for several hours which she describes as sharp.  She denies any rashes but states that she is bruising easily and her husband found a bruise on her left forearm she has no idea where it came from.  The pain in her upper quadrant and back was worse with movement and change in position.  She also felt like it might of hurt a little bit more when she took a deep breath and that during that time her lightheadedness was worse.  She denies nausea or vomiting.  She denies flank pain or urinary symptoms.  Patient denies a history of anxiety or anxiety about her health but does state that all of the symptoms make her feel a bit anxious.  She has lung known lung nodules and has followed with pulmonary medicine.  She has a previous HIDA scan and ultrasounds of the abdomen that are negative.  He has had swallow screens and swallow studies which have been negative minus a small hiatal hernia  HPI     Home Medications Prior to Admission medications   Medication Sig Start Date End Date Taking? Authorizing Provider  albuterol  (VENTOLIN  HFA) 108 (90 Base) MCG/ACT inhaler  INHALE 2 PUFFS INTO THE LUNGS EVERY 4 HOURS AS NEEDED FOR WHEEZING OR SHORTNESS OF BREATH 04/14/23   Jeneal Danita Macintosh, MD  aspirin EC 81 MG tablet Take 81 mg by mouth daily.    [provider]  atorvastatin (LIPITOR) 10 MG tablet Take 10 mg by mouth daily.    [provider]  cyclobenzaprine (FLEXERIL) 10 MG tablet Take 10 mg by mouth 3 (three) times daily as needed for muscle spasms.    [provider]  diclofenac  Sodium (VOLTAREN ) 1 % GEL     [provider]  EPINEPHrine  (EPIPEN  2-PAK) 0.3 mg/0.3 mL IJ SOAJ injection Inject 0.3 mg into the muscle as needed for anaphylaxis. 12/22/21   Marinda Rocky SAILOR, MD  gabapentin  (NEURONTIN ) 100 MG capsule Take 1 capsule (100 mg total) by mouth 2 (two) times daily. 04/06/22   Leigh Venetia CROME, MD  ibuprofen  (ADVIL ) 800 MG tablet Take 1 tablet (800 mg total) by mouth 3 (three) times daily as needed for moderate pain. 05/31/22   Leigh Venetia CROME, MD  loratadine  (CLARITIN ) 10 MG tablet Take 1 tablet (10 mg total) by mouth daily. 08/19/22   Jeneal Danita Macintosh, MD  olmesartan (BENICAR) 20 MG tablet Take 20 mg by mouth daily. 08/15/22   [provider]  RYALTRIS  334-74 MCG/ACT SUSP Place 2 sprays into the nose 2 (two) times daily. 08/19/22   Jeneal Danita Macintosh,  MD      Allergies    Shellfish allergy , Latex, and Sulfonamide derivatives    Review of Systems   Review of Systems  Physical Exam Updated Vital Signs BP (!) 147/85 (BP Location: Right Arm)   Pulse 82   Temp 98.1 F (36.7 C)   Resp 16   Ht 5' 8 (1.727 m)   Wt 120.4 kg   LMP 10/11/2010   SpO2 100%   BMI 40.36 kg/m  Physical Exam Vitals and nursing note reviewed.  Constitutional:      General: She is not in acute distress.    Appearance: She is well-developed. She is not diaphoretic.  HENT:     Head: Normocephalic and atraumatic.     Right Ear: External ear normal.     Left Ear: External ear normal.     Nose: Nose normal.      Mouth/Throat:     Mouth: Mucous membranes are moist.  Eyes:     General: No scleral icterus.    Conjunctiva/sclera: Conjunctivae normal.  Cardiovascular:     Rate and Rhythm: Normal rate and regular rhythm.     Heart sounds: Normal heart sounds. No murmur heard.    No friction rub. No gallop.  Pulmonary:     Effort: Pulmonary effort is normal. No respiratory distress.     Breath sounds: Normal breath sounds.  Abdominal:     General: Bowel sounds are normal. There is no distension.     Palpations: Abdomen is soft. There is no mass.     Tenderness: There is no abdominal tenderness. There is no guarding.  Musculoskeletal:     Cervical back: Normal range of motion.  Skin:    General: Skin is warm and dry.  Neurological:     Mental Status: She is alert and oriented to person, place, and time.  Psychiatric:        Behavior: Behavior normal.     ED Results / Procedures / Treatments   Labs (all labs ordered are listed, but only abnormal results are displayed) Labs Reviewed  COMPREHENSIVE METABOLIC PANEL - Abnormal; Notable for the following components:      Result Value   Potassium 3.3 (*)    All other components within normal limits  LIPASE, BLOOD  CBC  URINALYSIS, ROUTINE W REFLEX MICROSCOPIC  D-DIMER, QUANTITATIVE  TROPONIN I (HIGH SENSITIVITY)  TROPONIN I (HIGH SENSITIVITY)    EKG None  Radiology No results found.  Procedures Procedures    Medications Ordered in ED Medications - No data to display  ED Course/ Medical Decision Making/ A&P Clinical Course as of 05/04/23 1814  Thu May 04, 2023  1801 Potassium(!): 3.3 [AH]    Clinical Course User Index [AH] Arloa Chroman, PA-C                                 Medical Decision Making Amount and/or Complexity of Data Reviewed Labs: ordered. Decision-making details documented in ED Course. Radiology: ordered.  Risk Prescription drug management.   63 y/o F with multiple complaints inculding globus  sensation with negative work up, abdominal pain, sob, all chronic. High concern for underliying anxiety by my assessment, although patient denies this.  The differential diagnosis for generalized abdominal pain includes, but is not limited to AAA, gastroenteritis, appendicitis, Bowel obstruction, Bowel perforation. Gastroparesis, DKA, Hernia, Inflammatory bowel disease, mesenteric ischemia, pancreatitis, peritonitis SBP, volvulus.  Patient has had negative hida  can and negative US  in the patient  Labs: reassuring  I personally visualized and interpreted the images using our PACS system. Acute findings include:  Cxr negaive  Ct imaging pending Suspect anxiety and nonemergent cause of sxs Sign out given to PA Smoot at shift hand off         Final Clinical Impression(s) / ED Diagnoses Final diagnoses:  None    Rx / DC Orders ED Discharge Orders     None         Arloa Chroman, PA-C 05/06/23 1738    Pamella Ozell LABOR, DO 05/10/23 1404

## 2023-05-04 NOTE — ED Notes (Signed)
 Patient transported to X-ray

## 2023-05-04 NOTE — ED Triage Notes (Signed)
 Pt states shortness of breath with exertion that started x 2-3 days, used inhaler this am  States started to have sharp pain to left side of chest  Also states intermittent chronic abd pain, has had US  with PCP and everything is normal per pt States feels like an organ is loose, pain to RUQ and right upper back  Denies N/V, Denies fever, Denies diarrhea  Also states eye pressure    H/o asthma

## 2023-05-04 NOTE — Discharge Instructions (Signed)
 As we discussed, your workup in the ER today was reassuring for acute findings.  Laboratory evaluation and CT imaging did not reveal any emergent cause of your pain or other symptoms.  I recommend that you follow-up closely with your primary care doctor for continued evaluation and management of your chronic symptoms.  Return if development of any new or worsening symptoms.

## 2023-05-05 ENCOUNTER — Telehealth: Payer: Self-pay

## 2023-05-05 LAB — CBC
HCT: 42.1 % (ref 36.0–46.0)
Hemoglobin: 13.7 g/dL (ref 12.0–15.0)
MCH: 27.4 pg (ref 26.0–34.0)
MCHC: 32.5 g/dL (ref 30.0–36.0)
MCV: 84.2 fL (ref 80.0–100.0)
Platelets: 165 10*3/uL (ref 150–400)
RBC: 5 MIL/uL (ref 3.87–5.11)
RDW: 13.4 % (ref 11.5–15.5)
WBC: 7.7 10*3/uL (ref 4.0–10.5)
nRBC: 0 % (ref 0.0–0.2)

## 2023-05-05 NOTE — Telephone Encounter (Signed)
 Called and left message that she will need an appointment to get refill on gabapentin due to it being over one year seeing Dr. Loleta Chance.

## 2023-05-08 NOTE — Progress Notes (Signed)
 I saw Nicole Rasmussen in neurology clinic on 05/11/23 in follow up for left neck and arm pain, neuropathy, and B12 deficiency.  HPI: Nicole Rasmussen is a 63 y.o. year old female with a history of B12 deficiency, OA c/b joint pain and back pain, GERD, HLD, prediabetes, vit D deficiency who we last saw on 04/06/22.  To briefly review: Initial consultation 12/17/21: Patient is having tingling in her left neck, arm, and leg. When she has back pain, it is also on the left. The tingling in the arms started in the last 6 months. It was coming and going until about 2 months ago when it became constant. It is a shock like sensation. It radiates from her neck into all of her fingers. Bending her neck (like when shampooing at the hair salon) makes the sensations worse. The pain is particularly uncomfortable at night and can wake her up. She can straighten her arm and it will help sometimes. She sleeps on her side, but tries to avoid her left side. She often has her arms curled up. The pain can be 10/10, but averages 6/10. It occasionally can complete go away. She occasionally gets similar symptoms on the right but very rare. She denies weakness. She does have pain in her right knee that makes walking difficult at times.    She takes gabapentin  400 mg QID (given by PCP). It helps knock down the pain along with diclofenac , flexeril, and tylenol . She has previously tried PT (many years ago) for her back and this did not help much.   She also endorses tingling in both feet (maybe right more than left). She does not currently have this tingling though.   Patient was seen by Dr. Avram in GI for abdominal pain of unknown origin. The clinic note from 09/17/21 was reviewed. Patient had previously been seen by [facility], but wanted second opinion for her pain, so consult to [facility] neurology was placed.    Patient previously saw Dr. Buck at [facility] (last seen 05/06/20). The clinic notes indicate  patient had burning/tingling sensations in her right thigh. She also described radiating pain from lower back and a long standing history of low back pain. Per 12/15/15 office note:  she had a lumbar spine MRI without contrast at [facility] on 09/27/2013: Depression: This is an abnormal MRI of the lumbar spine showing degenerative changes at L3-L4, L4-L5, and L5-S1 as detailed above. The post significant findings are at L5-S1 were severe facet hypertrophy causes moderate left foraminal narrowing that could lead to dynamic impingement of the left L5 nerve root. Patient had EMG on 01/27/16. Per clinic note on 04/18/16, the impression was:  This NCV/EMG study showed the following: 1.    No evidence of polyneuropathy.    2.    Mild chronic right L5 radiculopathy without active features. 3.    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   MRI of lumbar spine showed degenerative changes so patient was referred to spine. She saw Dr. Jerri in othropedics. The note from 10/10/20 indicates she was seen for low back pain. Patient was referred to PT, given diclofenac , and recommended to lose weight.   EtOH use: Socially, beer or wine (1-2 per day; 4 times per week)  Restrictive diet? No Family history of neuropathy/myopathy/NM disease? None   04/06/22: Labs showed a borderline low B12 level (284). EMG of the LUE was consistent with upper limb manifestations of PN with no evidence of  cervical radiculopathy or median or ulnar mononeuropathy.   Patient's pain is getting worse in her neck. It is still on the left. When she tilts her head back, she gets pain in the left neck and her arm tingling. She takes ibuprofen  for her knee, but this also helps with shoulder. She is also on flexeril and meloxicam for her neck discomfort.   She takes gabapentin  after work and again before bed usually. She takes 400 mg 2-3 times per day. It helps. She is happy with the relief she gets at  night. She is not able to take gabapentin  during the day because it makes her too sleepy.    She went to PT. She went to 6-7 sessions. This did not help, so she stopped going. She was doing home exercises, but this did not help.    She has also gotten a TENS unit, but this has also not helped much.  Most recent Assessment and Plan (04/06/22): This is Nicole Rasmussen, a 63 y.o. female with:   Left neck, shoulder pain with radiating numbness and tingling - worse with tilting head backward or certain positions. Recent EMG without evidence of cervical radiculopathy, but previous MRI cervical spine (2012) showed concern for effacement of left cord (~C5-6). The worsening symptoms could be evolution of this pathology. 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 Continue gabapentin  400 mg twice nightly with flexeril and meloxicam -During day gabapentin  and flexeril makes patient too sleepy to take. Will start lower dose gabapentin  100 mg twice during the day. I also gave recommendations for topicals lidocaine  cream and lidocaine  patches -Encouraged home PT exercises for neck -Continue B12 supplementation 1000 mcg daily  Since their last visit: MRI cervical spine on 05/14/22 showed significant left foraminal stenosis at C5-6. Patient was referred to neurosurgery given worsening pain. She saw Dr. Onetha at [facility] who recommend surgery given failure of conservative treatment. Patient declined this due other issues going on including abdominal pain and lightheadedness and imbalance.  The left shoulder pain was doing much better until recently when it started to flare again. She continues to have tingling in her legs and feet. She is having a lot of numbness and burning in the feet.  Patient is no longer taking  B12, stopping quite at while ago.  She continues to take gabapentin  100 mg in the morning and 300 mg in the evening. She takes this as needed. She will take it at least 3-4 times per week. She feels this dose is working for her.    MEDICATIONS:  Outpatient Encounter Medications as of 05/11/2023  Medication Sig   albuterol  (VENTOLIN  HFA) 108 (90 Base) MCG/ACT inhaler INHALE 2 PUFFS INTO THE LUNGS EVERY 4 HOURS AS NEEDED FOR WHEEZING OR SHORTNESS OF BREATH   aspirin EC 81 MG tablet Take 81 mg by mouth daily.   atorvastatin (LIPITOR) 10 MG tablet Take 10 mg by mouth daily.   cyclobenzaprine (FLEXERIL) 10 MG tablet Take 10 mg by mouth 3 (three) times daily as needed for muscle spasms.   diclofenac  Sodium (VOLTAREN ) 1 % GEL    EPINEPHrine  (EPIPEN  2-PAK) 0.3 mg/0.3 mL IJ SOAJ injection Inject 0.3 mg into the muscle as needed for anaphylaxis.   ibuprofen  (ADVIL ) 800 MG tablet Take 1 tablet (800 mg total) by mouth 3 (three) times daily  as needed for moderate pain.   loratadine  (CLARITIN ) 10 MG tablet Take 1 tablet (10 mg total) by mouth daily.   olmesartan (BENICAR) 20 MG tablet Take 20 mg by mouth daily.   RYALTRIS  665-25 MCG/ACT SUSP Place 2 sprays into the nose 2 (two) times daily.   [DISCONTINUED] gabapentin  (NEURONTIN ) 100 MG capsule Take 1 capsule (100 mg total) by mouth 2 (two) times daily. (Patient taking differently: Take 300 mg by mouth as needed.)   gabapentin  (NEURONTIN ) 100 MG capsule Take 1 capsule (100 mg) in the morning and take 3 capsules (300 mg) in the evening   No facility-administered encounter medications on file as of 05/11/2023.    PAST MEDICAL HISTORY: Past Medical History:  Diagnosis Date   Acid reflux    Allergy     Anemia    Arthritis    Asthma    no inhalers   B12 deficiency    Dysmetabolic syndrome X    Dyspnea    Food allergy     shellfish   GERD (gastroesophageal reflux disease)    HLD (hyperlipidemia)    Joint pain    knees and back   Lower back pain     Nerve pain    top of right thigh   Neuromuscular disorder (HCC)    bilateral leg pain from back issue   Prediabetes    Shortness of breath    Sinus problem    Swallowing difficulty    Vitamin B 12 deficiency    Vitamin D  deficiency     PAST SURGICAL HISTORY: Past Surgical History:  Procedure Laterality Date   CESAREAN SECTION     1 time   COLONOSCOPY     ESOPHAGOGASTRODUODENOSCOPY     FOOT SURGERY Bilateral 1980's   bunionectomy and hammer toe on both feet   MOUTH SURGERY  2011   had implants/ root canal/bridges   WISDOM TOOTH EXTRACTION     with the implants    ALLERGIES: Allergies  Allergen Reactions   Shellfish Allergy  Hives and Swelling   Latex Hives    hives   Sulfonamide Derivatives Other (See Comments)    Rash, itching    FAMILY HISTORY: Family History  Problem Relation Age of Onset   Cancer Mother    Hypertension Mother    Hyperlipidemia Mother    Heart disease Mother    Breast cancer Mother    Cancer Father    Kidney disease Father    Prostate cancer Father    Hyperlipidemia Sister    Diabetes Sister    Hypertension Sister    Hypertension Sister    Hypertension Sister    Hypertension Sister    Hyperlipidemia Brother    Hypertension Brother    Prostate cancer Brother    Hypertension Brother    Hypertension Brother    Hypertension Brother    Colon cancer Neg Hx    Esophageal cancer Neg Hx    Pancreatic cancer Neg Hx    Stomach cancer Neg Hx     SOCIAL HISTORY: Social History   Tobacco Use   Smoking status: Former    Current packs/day: 0.00    Average packs/day: 1 pack/day for 20.0 years (20.0 ttl pk-yrs)    Types: Cigarettes    Start date: 05/02/1976    Quit date: 05/02/1996    Years since quitting: 27.0    Passive exposure: Never   Smokeless tobacco: Never  Vaping Use   Vaping status: Never Used  Substance Use Topics  Alcohol use: Yes    Comment: social   Drug use: No   Social History   Social History Narrative   Occasional  caffeine use    Right handed    Live in a one level home husband and daughter when home from school   Duty [employer]     Objective:  Vital Signs:  BP 128/82 (Cuff Size: Large)   Pulse 85   Ht 5' 8 (1.727 m)   Wt 265 lb (120.2 kg)   LMP 10/11/2010   SpO2 99%   BMI 40.29 kg/m   General: General appearance: Awake and alert. No distress. Cooperative with exam.  Skin: No obvious rash or jaundice. HEENT: Atraumatic. Anicteric. Lungs: Non-labored breathing on room air  Extremities: No obvious deformity.   Neurological: Mental Status: Alert. Speech fluent. No pseudobulbar affect Cranial Nerves: CNII: No RAPD. Visual fields intact. CNIII, IV, VI: PERRL. No nystagmus. EOMI. CN V: Facial sensation intact bilaterally to fine touch. [place] clench strong. CN VII: Facial muscles symmetric and strong. No ptosis at rest. CN VIII: Hears finger rub well bilaterally. CN IX: No hypophonia. CN X: Palate elevates symmetrically. CN XI: Full strength shoulder shrug bilaterally. CN XII: Tongue protrusion full and midline. No atrophy or fasciculations. No significant dysarthria Motor: Tone is normal. Strength 5/5 in bilateral upper and lower extremities. No atrophy. Reflexes:  Right Left  Bicep 2+ 2+  Tricep 2+ 2+  BrRad 2+ 2+  Knee 2+ 2+  Ankle 1+ 1+   Pathological Reflexes: Babinski: flexor response bilaterally Hoffman: absent bilaterally Troemner: absent bilaterally Sensation: Pinprick: Intact in all extremities Vibration: Intact in all extremities Proprioception: Intact in bilateral great toes Coordination: Intact finger-to- nose-finger and heel-to-shin bilaterally. Romberg negative. Gait: Able to rise from chair with arms crossed unassisted. Normal, narrow-based gait. Able to tandem walk. Able to walk on toes and heels.   Lab and Test Review: New results: 05/04/23: CBC unremarkable CMP unremarkable  CT abd/pelvis (05/04/23): IMPRESSION: 1. No filling defect is identified  in the pulmonary arterial tree to suggest pulmonary embolus. 2. 2 mm punctate nonobstructive right mid kidney calculus. 3. Mild sigmoid colon diverticulosis. 4. Left foraminal impingement at L3-4, L4-5, and L5-S1 (most severe at L5-S1). 5. Aortic atherosclerosis.  MRI cervical spine wo contrast (05/14/22): FINDINGS: Alignment: Normal   Vertebrae: Normal marrow signal. No bone lesions or fractures.   Cord: Normal cord signal intensity. No cord lesions or syrinx.   Posterior Fossa, vertebral arteries, paraspinal tissues: No significant findings.   Disc levels:   C2-3: No significant findings.   C3-4: Very shallow central disc protrusion but no neural compression. No foraminal stenosis.   C4-5: No significant findings.   C5-6: Diffuse bulging annulus and mild osteophytic ridging with flattening of the ventral thecal sac and mild narrowing of the ventral CSF space. There is also a left foraminal disc osteophyte complex with significant left foraminal stenosis likely involving the left C6 nerve root and responsible for the patient's left radicular symptoms.   C6-7: Minimal disc bulge but no significant disc protrusions, spinal or foraminal stenosis.   C7-T1: No significant findings. Slightly dilated exiting C8 nerve root sheaths purses perineural cysts. No significant findings.   IMPRESSION: 1. Left foraminal disc osteophyte complex at C5-6 with significant left foraminal stenosis likely responsible for the patient's left radicular symptoms. 2. Very shallow central disc protrusion at C3-4 but no neural compression. 3. Normal MR appearance of the cervical spinal cord.   Previously reviewed results: 12/17/21: B12 284,  IFE and SPEP with no M protein   HbA1c (08/2019): 6.3 TSH: 1.44   EMG (01/31/22): NCV & EMG Findings: Extensive electrodiagnostic evaluation of the left upper limb with additional nerve conduction studies of the right upper limb shows: Absent median and  ulnar sensory responses bilaterally. The left radial sensory response is within normal limits. Bilateral medial antebrachial cutaneous sensory responses are absent and bilateral lateral antebrachial cutaneous sensory response show reduced amplitude (L8, R8 V). Left median (APB) and left ulnar (ADM) motor responses are within normal limits. There is no evidence of active or chronic motor axon loss changes affecting any of the tested muscles. Motor unit configuration and recruitment pattern is within normal limits.   Impression: This is an abnormal study of the left upper limb. 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  The absent bilateral medial antebrachial cutaneous sensory responses and low amplitude bilateral lateral antebrachial cutaneous sensory response are likely technical in nature and not due to bilateral plexopathy given the normal needle examination.  There is no evidence of a left cervical (C5-T1) motor radiculopathy.  There is no definitive evidence of a left median mononeuropathy at or distal to the wrist (ie no evidence of left carpal tunnel syndrome).   MRI lumbar spine (11/17/16): FINDINGS:  The lumbar vertebrae demonstrate normal alignment, body height and marrow signal. T12-L1 and L1-L2show only minor facet hypertrophy without compression.L2-3 shows asymmetric facet hypertrophy resulting in mild left narrowing but no compression.L3-4 shows severe lateral disc osteophyte bulge to the left and  asymmetric facet hypertrophy resulting in severe foraminal narrowing on left and likely impingement on the exiting nerve root on left.L4-5 also shows similar changes but moderate narrowing the left foramina.SABRA L5-S1 also shows asymmetric facet hypertrophy with severe left  sided foraminall narrowing with likely impingement of the exiting nerve root.the conus medullaris terminates at L1. The visualized lower thoracic vertebrae and paraspinal soft tissue appear unremarkable.    MRI cervical spine (08/28/10): Findings: There is no abnormality at the foramen magnum, C1-2, C2-  3, C3-4 or C4-5.  The discs are normal.  The canal and foramina are  widely patent.  No osseous or articular finding.   At C5-6, there is spondylosis with endplate osteophytes and shallow  broad-based herniation more towards the left.  This effaces the  ventral subarachnoid space indents the cord slightly.  No foraminal  extension is seen.   C6-7, C7-T1 and T1-2 are normal.   IMPRESSION:  Single level pathology at C5-6, there is disc degeneration with  endplate osteophytes and shallow left posterolateral predominant  disc herniation.  This effaces the ventral subarachnoid space and  indents the cord slightly on the left.  No foraminal extension is  detected.    EMG (01/27/16 at [facility]): 1.    No evidence of polyneuropathy.    2.    Mild chronic right L5 radiculopathy without active features. 3.    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  ASSESSMENT: This is Nicole Rasmussen, a 63 y.o. female with numbness and tingling in arms and legs, most prominent in her left arm. She has known cervical and lumbosacral spondylosis that is at least contributing to her symptoms. My EMG of her upper extremity suggested upper limb manifestations of a polyneuropathy as well. No carpal  tunnel syndrome was seen. This is of note because given her GI issues and lightheadedness, this could suggest autonomic dysfunction, which in the setting of polyneuropathy makes amyloidosis, particularly hTTR amyloidosis a possibility. Of note, her echo showed no evidence of heart failure. Given that there is free, sponsored testing and a treatment, this would still be important  to rule out though. In terms of risk factors for PN, she has known pre-DM and borderline low B12 and no longer taking supplement. I will get labs and genetic testing today to look for treatable causes.  Plan: -Blood work: HbA1c, B1, B12, MMA, folate, kappa/lambda light chains -Genetic testing for hTTR amyloidosis -Refill gabapentin : 100 mg in the morning and 300 mg at night as this is working well for her pain -Fall precautions discussed  Return to clinic in 6 months  Total time spent reviewing records, interview, history/exam, documentation, and coordination of care on day of encounter:  55 min  Venetia Potters, MD

## 2023-05-11 ENCOUNTER — Other Ambulatory Visit: Payer: 59

## 2023-05-11 ENCOUNTER — Ambulatory Visit: Payer: 59 | Admitting: Neurology

## 2023-05-11 ENCOUNTER — Encounter: Payer: Self-pay | Admitting: Neurology

## 2023-05-11 VITALS — BP 128/82 | HR 85 | Ht 68.0 in | Wt 265.0 lb

## 2023-05-11 DIAGNOSIS — R202 Paresthesia of skin: Secondary | ICD-10-CM

## 2023-05-11 DIAGNOSIS — G8929 Other chronic pain: Secondary | ICD-10-CM

## 2023-05-11 DIAGNOSIS — R200 Anesthesia of skin: Secondary | ICD-10-CM

## 2023-05-11 DIAGNOSIS — R1013 Epigastric pain: Secondary | ICD-10-CM

## 2023-05-11 DIAGNOSIS — G629 Polyneuropathy, unspecified: Secondary | ICD-10-CM | POA: Diagnosis not present

## 2023-05-11 DIAGNOSIS — M5442 Lumbago with sciatica, left side: Secondary | ICD-10-CM | POA: Diagnosis not present

## 2023-05-11 DIAGNOSIS — M5412 Radiculopathy, cervical region: Secondary | ICD-10-CM | POA: Diagnosis not present

## 2023-05-11 DIAGNOSIS — M542 Cervicalgia: Secondary | ICD-10-CM | POA: Diagnosis not present

## 2023-05-11 DIAGNOSIS — R209 Unspecified disturbances of skin sensation: Secondary | ICD-10-CM

## 2023-05-11 DIAGNOSIS — E538 Deficiency of other specified B group vitamins: Secondary | ICD-10-CM

## 2023-05-11 DIAGNOSIS — R7303 Prediabetes: Secondary | ICD-10-CM

## 2023-05-11 MED ORDER — GABAPENTIN 100 MG PO CAPS: ORAL_CAPSULE | ORAL | 5 refills | Status: DC

## 2023-05-11 NOTE — Patient Instructions (Signed)
 I saw you today for the numbness and tingling in your legs and arms. Your symptoms are likely due to nerve damage from pinched nerves in the neck and back and also due to nerve damage from elevated blood sugar and low vitamins we have seen in the past.  I would like to get more blood work today to see how these numbers are looking.  I would also like to send a genetic test looking for a hereditary condition in which an abnormal protein (amyloid) builds up on nerves and can cause a lot of different symptoms, including GI problems. This testing is free, so makes sense to be sure.  I will be in touch when I have your results.  I refilled your gabapentin , 100 mg in the morning and 300 mg at night.  Please let me know if you have any questions or concerns in the meantime.  The physicians and staff at [facility] are committed to providing excellent care. You may receive a survey requesting feedback about your experience at our office. We strive to receive very good responses to the survey questions. If you feel that your experience would prevent you from giving the office a very good  response, please contact our office to try to remedy the situation. We may be reached at [phone]. Thank you for taking the time out of your busy day to complete the survey.  Venetia Potters, MD [facility]  Preventing Falls at [facility] are common, often dreaded events in the lives of older people. Aside from the obvious injuries and even death that may result, fall can cause wide-ranging consequences including loss of independence, mental decline, decreased activity and mobility. Younger people are also at risk of falling, especially those with chronic illnesses and fatigue.  Ways to reduce risk for falling Examine diet and medications. Warm foods and alcohol dilate blood vessels, which can lead to dizziness when standing. Sleep aids, antidepressants and pain medications can also increase the  likelihood of a fall.  Get a vision exam. Poor vision, cataracts and glaucoma increase the chances of falling.  Check foot gear. Shoes should fit snugly and have a sturdy, nonskid sole and a broad, low heel  Participate in a physician-approved exercise program to build and maintain muscle strength and improve balance and coordination. Programs that use ankle weights or stretch bands are excellent for muscle-strengthening. Water  aerobics programs and low-impact Tai Chi programs have also been shown to improve balance and coordination.  Increase vitamin D  intake. Vitamin D  improves muscle strength and increases the amount of calcium the body is able to absorb and deposit in bones.  How to prevent falls from common hazards Floors - Remove all loose wires, cords, and throw rugs. Minimize clutter. Make sure rugs are anchored and smooth. Keep furniture in its usual place.  Chairs -- Use chairs with straight backs, armrests and firm seats. Add firm cushions to existing pieces to add height.  Bathroom - Install grab bars and non-skid tape in the tub or shower. Use a bathtub transfer bench or a shower chair with a back support Use an elevated toilet seat and/or safety rails to assist standing from a low surface. Do not use towel racks or bathroom tissue holders to help you stand.  Lighting - Make sure halls, stairways, and entrances are well-lit. Install a night light in your bathroom or hallway. Make sure there is a light switch at the top and bottom of the staircase. Turn lights on if  you get up in the middle of the night. Make sure lamps or light switches are within reach of the bed if you have to get up during the night.  Kitchen - Install non-skid rubber mats near the sink and stove. Clean spills immediately. Store frequently used utensils, pots, pans between waist and eye level. This helps prevent reaching and bending. Sit when getting things out of lower cupboards.  Living room/ Bedrooms - Place  furniture with wide spaces in between, giving enough room to move around. Establish a route through the living room that gives you something to hold onto as you walk.  Stairs - Make sure treads, rails, and rugs are secure. Install a rail on both sides of the stairs. If stairs are a threat, it might be helpful to arrange most of your activities on the lower level to reduce the number of times you must climb the stairs.  Entrances and doorways - Install metal handles on the walls adjacent to the doorknobs of all doors to make it more secure as you travel through the doorway.  Tips for maintaining balance Keep at least one hand free at all times. Try using a backpack or fanny pack to hold things rather than carrying them in your hands. Never carry objects in both hands when walking as this interferes with keeping your balance.  Attempt to swing both arms from front to back while walking. This might require a conscious effort if Parkinson's disease has diminished your movement. It will, however, help you to maintain balance and posture, and reduce fatigue.  Consciously lift your feet off of the ground when walking. Shuffling and dragging of the feet is a common culprit in losing your balance.  When trying to navigate turns, use a U technique of facing forward and making a wide turn, rather than pivoting sharply.  Try to stand with your feet shoulder-length apart. When your feet are close together for any length of time, you increase your risk of losing your balance and falling.  Do one thing at a time. Don't try to walk and accomplish another task, such as reading or looking around. The decrease in your automatic reflexes complicates motor function, so the less distraction, the better.  Do not wear rubber or gripping soled shoes, they might catch on the floor and cause tripping.  Move slowly when changing positions. Use deliberate, concentrated movements and, if needed, use a grab bar or walking  aid. Count 15 seconds between each movement. For example, when rising from a seated position, wait 15 seconds after standing to begin walking.  If balance is a continuous problem, you might want to consider a walking aid such as a cane, walking stick, or walker. Once you've mastered walking with help, you might be ready to try it on your own again.

## 2023-05-18 ENCOUNTER — Encounter: Payer: Self-pay | Admitting: Neurology

## 2023-05-18 LAB — HEMOGLOBIN A1C
Hgb A1c MFr Bld: 5.9 %{Hb} — ABNORMAL HIGH (ref <5.7)
Mean Plasma Glucose: 123 mg/dL
eAG (mmol/L): 6.8 mmol/L

## 2023-05-18 LAB — KAPPA/LAMBDA LIGHT CHAINS
Kappa free light chain: 14.1 mg/L (ref 3.3–19.4)
Kappa:Lambda Ratio: 1.57 (ref 0.26–1.65)
Lambda Free Lght Chn: 9 mg/L (ref 5.7–26.3)

## 2023-05-18 LAB — B12 AND FOLATE PANEL
Folate: 5 ng/mL — ABNORMAL LOW
Vitamin B-12: 353 pg/mL (ref 200–1100)

## 2023-05-18 LAB — METHYLMALONIC ACID, SERUM: Methylmalonic Acid, Quant: 237 nmol/L (ref 69–390)

## 2023-05-18 LAB — VITAMIN B1: Vitamin B1 (Thiamine): 6 nmol/L — ABNORMAL LOW (ref 8–30)

## 2023-05-19 ENCOUNTER — Other Ambulatory Visit: Payer: Self-pay | Admitting: Allergy

## 2023-05-22 ENCOUNTER — Encounter: Payer: Self-pay | Admitting: Neurology

## 2023-05-22 ENCOUNTER — Other Ambulatory Visit: Payer: Self-pay | Admitting: Neurology

## 2023-05-22 DIAGNOSIS — E538 Deficiency of other specified B group vitamins: Secondary | ICD-10-CM

## 2023-05-22 MED ORDER — FOLIC ACID 1 MG PO TABS: 1.0000 mg | ORAL_TABLET | Freq: Every day | ORAL | 5 refills | Status: AC

## 2023-05-23 ENCOUNTER — Other Ambulatory Visit: Payer: Self-pay | Admitting: Obstetrics and Gynecology

## 2023-05-23 DIAGNOSIS — R928 Other abnormal and inconclusive findings on diagnostic imaging of breast: Secondary | ICD-10-CM

## 2023-06-06 ENCOUNTER — Ambulatory Visit
Admission: RE | Admit: 2023-06-06 | Discharge: 2023-06-06 | Disposition: A | Discharge status: Home / Self Care | Payer: 59 | Source: Ambulatory Visit | Attending: Obstetrics and Gynecology

## 2023-06-06 ENCOUNTER — Ambulatory Visit: Payer: 59

## 2023-06-06 DIAGNOSIS — R928 Other abnormal and inconclusive findings on diagnostic imaging of breast: Secondary | ICD-10-CM

## 2023-08-03 ENCOUNTER — Encounter (HOSPITAL_COMMUNITY): Payer: Self-pay | Admitting: Obstetrics and Gynecology

## 2023-08-08 ENCOUNTER — Encounter (HOSPITAL_COMMUNITY): Payer: Self-pay | Admitting: Obstetrics and Gynecology

## 2023-08-08 NOTE — Progress Notes (Signed)
 Spoke w/ via phone for pre-op interview--- Lona Kettle needs dos---- CBC, T&S per surgeon. BMP per anesthesia.        Lab results------ Current EKG in Epic dated 05/08/23. COVID test -----patient states asymptomatic no test needed Arrive at -------1030 NPO after MN NO Solid Food.  Clear liquids from MN until---0930 Pre-Surgery Ensure or G2:  Med rec completed Medications to take morning of surgery -----Gabapentin and Albuterol Diabetic medication -----  GLP1 agonist last dose: GLP1 instructions:  Patient instructed no nail polish to be worn day of surgery Patient instructed to bring photo id and insurance card day of surgery Patient aware to have Driver (ride ) / caregiver    for 24 hours after surgery - Daughter Baldemar Friday Patient Special Instructions ----- Shower with antibacterial soap. Pre-Op special Instructions -----  Patient verbalized understanding of instructions that were given at this phone interview. Patient denies chest pain, sob, fever, cough at the interview.

## 2023-08-10 NOTE — H&P (Signed)
 Nicole Rasmussen is an 63 y.o. female P1 with recurrent postmenopausal bleeding  EMB in 2020 benign, EMS at that time 5mm. 06/12/23 GYN scan: 4.45cm anteverted uterus, EMS 13.35mm, normal ovaries, EMB benign   Pertinent Gynecological History: Bleeding: post menopausal bleeding Contraception: none DES exposure: denies Blood transfusions: none Sexually transmitted diseases: past history Previous GYN Procedures:  EMB   Last mammogram: abnormal: 05/19/23 BIRADS 0, diagnostic mammo done and BIRADS 1 Last pap: normal Date: 05/19/23 OB History: G3, P1021   Menstrual History:  Patient's last menstrual period was 10/11/2010.    Past Medical History:  Diagnosis Date   Acid reflux    Allergy    Anemia    Arthritis    Asthma    no inhalers   B12 deficiency    Dysmetabolic syndrome X    Dyspnea    Food allergy    shellfish   GERD (gastroesophageal reflux disease)    HLD (hyperlipidemia)    Hypertension    Joint pain    knees and back   Lower back pain    Nerve pain    top of right thigh   Neuromuscular disorder (HCC)    bilateral leg pain from back issue   Pre-diabetes    Prediabetes    Shortness of breath    Sinus problem    Swallowing difficulty    Vitamin B 12 deficiency    Vitamin D deficiency     Past Surgical History:  Procedure Laterality Date   CESAREAN SECTION     1 time   COLONOSCOPY     ESOPHAGOGASTRODUODENOSCOPY     FOOT SURGERY Bilateral 1980's   bunionectomy and hammer toe on both feet   MOUTH SURGERY  2011   had implants/ root canal/bridges   WISDOM TOOTH EXTRACTION     with the implants    Family History  Problem Relation Age of Onset   Cancer Mother    Hypertension Mother    Hyperlipidemia Mother    Heart disease Mother    Breast cancer Mother    Cancer Father    Kidney disease Father    Prostate cancer Father    Hyperlipidemia Sister    Diabetes Sister    Hypertension Sister    Hypertension Sister    Hypertension Sister     Hypertension Sister    Hyperlipidemia Brother    Hypertension Brother    Prostate cancer Brother    Hypertension Brother    Hypertension Brother    Hypertension Brother    Colon cancer Neg Hx    Esophageal cancer Neg Hx    Pancreatic cancer Neg Hx    Stomach cancer Neg Hx     Social History:  reports that she quit smoking about 27 years ago. Her smoking use included cigarettes. She started smoking about 47 years ago. She has a 20 pack-year smoking history. She has never been exposed to tobacco smoke. She has never used smokeless tobacco. She reports current alcohol use. She reports that she does not use drugs.  Allergies:  Allergies  Allergen Reactions   Shellfish Allergy Hives and Swelling   Latex Hives   Sulfonamide Derivatives Itching and Rash    No medications prior to admission.    Review of Systems  Constitutional:  Negative for fever.  HENT:  Negative for sore throat.   Eyes:  Negative for visual disturbance.  Respiratory:  Negative for shortness of breath.   Cardiovascular:  Negative for chest pain.  Gastrointestinal:  Negative for abdominal pain.  Genitourinary:  Positive for hematuria. Negative for pelvic pain.  Musculoskeletal:  Negative for back pain.  Skin:  Negative for rash.  Neurological:  Negative for headaches.  Psychiatric/Behavioral:  Negative for suicidal ideas.     Height 5\' 8"  (1.727 m), weight 113.4 kg, last menstrual period 10/11/2010. Physical Exam   Chaperone Chaperone: present  Constitutional General Appearance: healthy-appearing, well-nourished, well-developed  Psychiatric Orientation: to time, to place, to person Mood and Affect: active and alert, normal mood, normal affect  Abdomen Auscultation/Inspection/Palpation: normal bowel sounds, soft, non-distended, no tenderness, no hepatomegaly, no splenomegaly, no masses, no CVA tenderness Hernia: none palpated  Female Genitalia Vulva: no masses, no atrophy, no  lesions Bladder/Urethra: normal meatus, no urethral discharge, no urethral mass, bladder non distended Vagina no tenderness, no erythema, no abnormal vaginal discharge, no vesicle(s) or ulcers, no cystocele, no rectocele Cervix: grossly normal, no discharge, no cervical motion tenderness Uterus: normal size, normal shape, midline, mobile, non-tender, no uterine prolapse Adnexa/Parametria: no parametrial tenderness, no parametrial mass, no adnexal tenderness, no ovarian mass  No results found for this or any previous visit (from the past 24 hours).  No results found.  Assessment/Plan: 62Y P1 with recurrent postmenopausal bleeding, EMB benign - Plan: Hysteroscopy, D&C with Myosure Lite - Reviewed risks including infection, bleeding, uterine perforation, laparoscopy, failure to complete procedure, failure to achieve desired result. All questions answered.    Charlett Nose 08/10/2023, 1:46 PM

## 2023-08-11 ENCOUNTER — Ambulatory Visit (HOSPITAL_COMMUNITY)
Admission: RE | Admit: 2023-08-11 | Discharge: 2023-08-11 | Disposition: A | Discharge status: Home / Self Care | Attending: Obstetrics and Gynecology | Admitting: Obstetrics and Gynecology

## 2023-08-11 ENCOUNTER — Encounter (HOSPITAL_COMMUNITY)
Admission: RE | Disposition: A | Discharge status: Home / Self Care | Payer: Self-pay | Source: Home / Self Care | Attending: Obstetrics and Gynecology

## 2023-08-11 ENCOUNTER — Other Ambulatory Visit: Payer: Self-pay

## 2023-08-11 ENCOUNTER — Ambulatory Visit (HOSPITAL_BASED_OUTPATIENT_CLINIC_OR_DEPARTMENT_OTHER): Admitting: Certified Registered"

## 2023-08-11 ENCOUNTER — Encounter (HOSPITAL_COMMUNITY): Payer: Self-pay | Admitting: Obstetrics and Gynecology

## 2023-08-11 ENCOUNTER — Ambulatory Visit (HOSPITAL_COMMUNITY): Admitting: Certified Registered"

## 2023-08-11 DIAGNOSIS — I10 Essential (primary) hypertension: Secondary | ICD-10-CM | POA: Diagnosis not present

## 2023-08-11 DIAGNOSIS — N950 Postmenopausal bleeding: Secondary | ICD-10-CM

## 2023-08-11 DIAGNOSIS — K219 Gastro-esophageal reflux disease without esophagitis: Secondary | ICD-10-CM | POA: Insufficient documentation

## 2023-08-11 DIAGNOSIS — G8929 Other chronic pain: Secondary | ICD-10-CM

## 2023-08-11 DIAGNOSIS — J45909 Unspecified asthma, uncomplicated: Secondary | ICD-10-CM | POA: Diagnosis not present

## 2023-08-11 DIAGNOSIS — Z01818 Encounter for other preprocedural examination: Secondary | ICD-10-CM

## 2023-08-11 DIAGNOSIS — R7303 Prediabetes: Secondary | ICD-10-CM | POA: Insufficient documentation

## 2023-08-11 DIAGNOSIS — M5412 Radiculopathy, cervical region: Secondary | ICD-10-CM

## 2023-08-11 DIAGNOSIS — Z87891 Personal history of nicotine dependence: Secondary | ICD-10-CM | POA: Insufficient documentation

## 2023-08-11 DIAGNOSIS — M542 Cervicalgia: Secondary | ICD-10-CM

## 2023-08-11 HISTORY — DX: Prediabetes: R73.03

## 2023-08-11 HISTORY — PX: HYSTEROSCOPY WITH D & C: SHX1775

## 2023-08-11 HISTORY — DX: Essential (primary) hypertension: I10

## 2023-08-11 LAB — CBC
HCT: 39.5 % (ref 36.0–46.0)
Hemoglobin: 12.7 g/dL (ref 12.0–15.0)
MCH: 27.7 pg (ref 26.0–34.0)
MCHC: 32.2 g/dL (ref 30.0–36.0)
MCV: 86.1 fL (ref 80.0–100.0)
Platelets: 159 10*3/uL (ref 150–400)
RBC: 4.59 MIL/uL (ref 3.87–5.11)
RDW: 14.5 % (ref 11.5–15.5)
WBC: 7.1 10*3/uL (ref 4.0–10.5)
nRBC: 0 % (ref 0.0–0.2)

## 2023-08-11 LAB — BASIC METABOLIC PANEL WITH GFR
Anion gap: 13 (ref 5–15)
BUN: 11 mg/dL (ref 8–23)
CO2: 22 mmol/L (ref 22–32)
Calcium: 9.4 mg/dL (ref 8.9–10.3)
Chloride: 105 mmol/L (ref 98–111)
Creatinine, Ser: 0.62 mg/dL (ref 0.44–1.00)
GFR, Estimated: >60 mL/min (ref >60)
Glucose, Bld: 92 mg/dL (ref 70–99)
Potassium: 3.9 mmol/L (ref 3.5–5.1)
Sodium: 140 mmol/L (ref 135–145)

## 2023-08-11 LAB — TYPE AND SCREEN
ABO/RH(D): A POS
Antibody Screen: NEGATIVE

## 2023-08-11 LAB — ABO/RH: ABO/RH(D): A POS

## 2023-08-11 SURGERY — DILATATION AND CURETTAGE /HYSTEROSCOPY
Anesthesia: General | Site: Vagina

## 2023-08-11 MED ORDER — LIDOCAINE 2% (20 MG/ML) 5 ML SYRINGE
INTRAMUSCULAR | Status: AC
  Filled 2023-08-11: qty 5

## 2023-08-11 MED ORDER — LIDOCAINE HCL 1 % IJ SOLN
INTRAMUSCULAR | Status: DC | PRN
  Administered 2023-08-11: 10 mL

## 2023-08-11 MED ORDER — ORAL CARE MOUTH RINSE: 15.0000 mL | Freq: Once | OROMUCOSAL | Status: AC

## 2023-08-11 MED ORDER — DROPERIDOL 2.5 MG/ML IJ SOLN: 0.6250 mg | Freq: Once | INTRAMUSCULAR | Status: DC | PRN

## 2023-08-11 MED ORDER — PHENYLEPHRINE 80 MCG/ML (10ML) SYRINGE FOR IV PUSH (FOR BLOOD PRESSURE SUPPORT)
PREFILLED_SYRINGE | INTRAVENOUS | Status: DC | PRN
  Administered 2023-08-11 (×2): 160 ug via INTRAVENOUS

## 2023-08-11 MED ORDER — LIDOCAINE 2% (20 MG/ML) 5 ML SYRINGE
INTRAMUSCULAR | Status: DC | PRN
  Administered 2023-08-11: 100 mg via INTRAVENOUS

## 2023-08-11 MED ORDER — DEXAMETHASONE SODIUM PHOSPHATE 10 MG/ML IJ SOLN
INTRAMUSCULAR | Status: AC
  Filled 2023-08-11: qty 1

## 2023-08-11 MED ORDER — FENTANYL CITRATE (PF) 100 MCG/2ML IJ SOLN: 25.0000 ug | INTRAMUSCULAR | Status: DC | PRN

## 2023-08-11 MED ORDER — ACETAMINOPHEN 10 MG/ML IV SOLN: 1000.0000 mg | Freq: Once | INTRAVENOUS | Status: DC | PRN

## 2023-08-11 MED ORDER — CHLORHEXIDINE GLUCONATE 0.12 % MT SOLN: 15.0000 mL | Freq: Once | OROMUCOSAL | Status: AC

## 2023-08-11 MED ORDER — SODIUM CHLORIDE 0.9 % IR SOLN
Status: DC | PRN
  Administered 2023-08-11: 3000 mL

## 2023-08-11 MED ORDER — PROPOFOL 10 MG/ML IV BOLUS
INTRAVENOUS | Status: DC | PRN
  Administered 2023-08-11: 200 mg via INTRAVENOUS

## 2023-08-11 MED ORDER — CHLORHEXIDINE GLUCONATE 0.12 % MT SOLN
OROMUCOSAL | Status: AC
  Administered 2023-08-11: 15 mL via OROMUCOSAL
  Filled 2023-08-11: qty 15

## 2023-08-11 MED ORDER — GLYCOPYRROLATE PF 0.2 MG/ML IJ SOSY
PREFILLED_SYRINGE | INTRAMUSCULAR | Status: DC | PRN
  Administered 2023-08-11: .2 mg via INTRAVENOUS

## 2023-08-11 MED ORDER — OXYCODONE HCL 5 MG PO TABS: 5.0000 mg | ORAL_TABLET | Freq: Once | ORAL | Status: DC | PRN

## 2023-08-11 MED ORDER — MIDAZOLAM HCL 2 MG/2ML IJ SOLN
INTRAMUSCULAR | Status: DC | PRN
  Administered 2023-08-11: 2 mg via INTRAVENOUS

## 2023-08-11 MED ORDER — OXYCODONE HCL 5 MG/5ML PO SOLN: 5.0000 mg | Freq: Once | ORAL | Status: DC | PRN

## 2023-08-11 MED ORDER — ACETAMINOPHEN 500 MG PO TABS: 1000.0000 mg | ORAL_TABLET | ORAL | Status: AC

## 2023-08-11 MED ORDER — KETOROLAC TROMETHAMINE 30 MG/ML IJ SOLN
INTRAMUSCULAR | Status: DC | PRN
  Administered 2023-08-11: 30 mg via INTRAVENOUS

## 2023-08-11 MED ORDER — SILVER NITRATE-POT NITRATE 75-25 % EX MISC
CUTANEOUS | Status: DC | PRN
  Administered 2023-08-11: 2

## 2023-08-11 MED ORDER — FENTANYL CITRATE (PF) 250 MCG/5ML IJ SOLN
INTRAMUSCULAR | Status: DC | PRN
  Administered 2023-08-11: 50 ug via INTRAVENOUS

## 2023-08-11 MED ORDER — PROPOFOL 10 MG/ML IV BOLUS
INTRAVENOUS | Status: AC
  Filled 2023-08-11: qty 20

## 2023-08-11 MED ORDER — LACTATED RINGERS IV SOLN: INTRAVENOUS | Status: DC

## 2023-08-11 MED ORDER — FENTANYL CITRATE (PF) 100 MCG/2ML IJ SOLN
INTRAMUSCULAR | Status: AC
  Administered 2023-08-11: 25 ug via INTRAVENOUS
  Filled 2023-08-11: qty 2

## 2023-08-11 MED ORDER — ONDANSETRON HCL 4 MG/2ML IJ SOLN
INTRAMUSCULAR | Status: DC | PRN
  Administered 2023-08-11: 4 mg via INTRAVENOUS

## 2023-08-11 MED ORDER — KETOROLAC TROMETHAMINE 30 MG/ML IJ SOLN
INTRAMUSCULAR | Status: AC
  Filled 2023-08-11: qty 1

## 2023-08-11 MED ORDER — FENTANYL CITRATE (PF) 250 MCG/5ML IJ SOLN
INTRAMUSCULAR | Status: AC
  Filled 2023-08-11: qty 5

## 2023-08-11 MED ORDER — DEXAMETHASONE SODIUM PHOSPHATE 10 MG/ML IJ SOLN
INTRAMUSCULAR | Status: DC | PRN
  Administered 2023-08-11: 10 mg via INTRAVENOUS

## 2023-08-11 MED ORDER — ONDANSETRON HCL 4 MG/2ML IJ SOLN
INTRAMUSCULAR | Status: AC
  Filled 2023-08-11: qty 2

## 2023-08-11 MED ORDER — EPHEDRINE SULFATE-NACL 50-0.9 MG/10ML-% IV SOSY
PREFILLED_SYRINGE | INTRAVENOUS | Status: DC | PRN
  Administered 2023-08-11: 10 mg via INTRAVENOUS

## 2023-08-11 MED ORDER — MIDAZOLAM HCL 2 MG/2ML IJ SOLN
INTRAMUSCULAR | Status: AC
  Filled 2023-08-11: qty 2

## 2023-08-11 MED ORDER — ACETAMINOPHEN 500 MG PO TABS
ORAL_TABLET | ORAL | Status: AC
  Administered 2023-08-11: 1000 mg via ORAL
  Filled 2023-08-11: qty 2

## 2023-08-11 SURGICAL SUPPLY — 14 items
CATH ROBINSON RED A/P 16FR (CATHETERS) ×1 IMPLANT
DEVICE MYOSURE LITE (MISCELLANEOUS) IMPLANT
DEVICE MYOSURE REACH (MISCELLANEOUS) IMPLANT
GLOVE BIO SURGEON STRL SZ 6 (GLOVE) ×1 IMPLANT
GLOVE BIOGEL PI IND STRL 6.5 (GLOVE) ×1 IMPLANT
GLOVE SURG UNDER POLY LF SZ7 (GLOVE) ×1 IMPLANT
GOWN STRL REUS W/ TWL LRG LVL3 (GOWN DISPOSABLE) ×2 IMPLANT
KIT PROCEDURE FLUENT (KITS) ×1 IMPLANT
KIT TURNOVER KIT B (KITS) ×1 IMPLANT
LOOP CUTTING BIPOLAR 21FR (ELECTRODE) IMPLANT
PACK VAGINAL MINOR WOMEN LF (CUSTOM PROCEDURE TRAY) ×1 IMPLANT
PAD OB MATERNITY 11 LF (PERSONAL CARE ITEMS) ×1 IMPLANT
SOL .9 NS 3000ML IRR UROMATIC (IV SOLUTION) IMPLANT
TOWEL GREEN STERILE FF (TOWEL DISPOSABLE) ×2 IMPLANT

## 2023-08-11 NOTE — Anesthesia Postprocedure Evaluation (Signed)
 Anesthesia Post Note  Patient: Nicole Rasmussen  Procedure(s) Performed: DILATATION AND CURETTAGE /HYSTEROSCOPY (Vagina )     Patient location during evaluation: PACU Anesthesia Type: General Level of consciousness: awake and alert Pain management: pain level controlled Vital Signs Assessment: post-procedure vital signs reviewed and stable Respiratory status: spontaneous breathing, nonlabored ventilation, respiratory function stable and patient connected to nasal cannula oxygen Cardiovascular status: blood pressure returned to baseline and stable Postop Assessment: no apparent nausea or vomiting Anesthetic complications: no   No notable events documented.  Last Vitals:  Vitals:   08/11/23 1330 08/11/23 1405  BP: 119/73 (!) 146/94  Pulse: 73 74  Resp: 12 14  Temp: 36.4 C 36.4 C  SpO2: 94% 98%    Last Pain:  Vitals:   08/11/23 1405  PainSc: 1                  Easton Nation

## 2023-08-11 NOTE — Brief Op Note (Signed)
 08/11/2023  1:02 PM  PATIENT:  Nicole Rasmussen  63 y.o. female  PRE-OPERATIVE DIAGNOSIS:  RECURRENT POST MENOPAUSAL BLEEDING  POST-OPERATIVE DIAGNOSIS:  RECURRENT POST MENOPAUSAL BLEEDING, SUSPECTED ENDOMETRIAL POLYP  PROCEDURE:  Procedure(s) with comments: DILATATION AND CURETTAGE /HYSTEROSCOPY (N/A) - NEED MYOSURE LITE  SURGEON:  Surgeons and Role:    * Charlett Nose, MD - Primary  ANESTHESIA:   local and general  EBL:  15 mL   BLOOD ADMINISTERED:none  DRAINS: none   LOCAL MEDICATIONS USED:  LIDOCAINE  and Amount: 10 ml  SPECIMEN:  Source of Specimen:  endometrial curettings  DISPOSITION OF SPECIMEN:  PATHOLOGY  COUNTS:  YES  TOURNIQUET:  * No tourniquets in log *  DICTATION: .Note written in EPIC  PLAN OF CARE: Discharge to home after PACU  PATIENT DISPOSITION:  PACU - hemodynamically stable.   Delay start of Pharmacological VTE agent (>24hrs) due to surgical blood loss or risk of bleeding: not applicable

## 2023-08-11 NOTE — Discharge Instructions (Addendum)
  Post Anesthesia Home Care Instructions  Activity: Get plenty of rest for the remainder of the day. A responsible individual must stay with you for 24 hours following the procedure.  For the next 24 hours, DO NOT: -Drive a car -Advertising copywriter -Drink alcoholic beverages -Take any medication unless instructed by your physician -Make any legal decisions or sign important papers.  Meals: Start with liquid foods such as gelatin or soup. Progress to regular foods as tolerated. Avoid greasy, spicy, heavy foods. If nausea and/or vomiting occur, drink only clear liquids until the nausea and/or vomiting subsides. Call your physician if vomiting continues.  Special Instructions/Symptoms: Your throat may feel dry or sore from the anesthesia or the breathing tube placed in your throat during surgery. If this causes discomfort, gargle with warm salt water. The discomfort should disappear within 24 hours.     No acetaminophen/Tylenol until after 5:30 pm today if needed. No ibuprofen, Advil, Aleve, Motrin, ketorolac, meloxicam, naproxen, or other NSAIDS until after 6:51 pm today if needed.

## 2023-08-11 NOTE — Anesthesia Procedure Notes (Signed)
 Procedure Name: LMA Insertion Date/Time: 08/11/2023 12:17 PM  Performed by: Stanton Kidney, CRNAPre-anesthesia Checklist: Patient identified, Emergency Drugs available, Suction available and Patient being monitored Patient Re-evaluated:Patient Re-evaluated prior to induction Oxygen Delivery Method: Circle system utilized Preoxygenation: Pre-oxygenation with 100% oxygen Induction Type: IV induction Dental Injury: Teeth and Oropharynx as per pre-operative assessment

## 2023-08-11 NOTE — Op Note (Signed)
 08/11/2023   1:02 PM   PATIENT:  Nicole Rasmussen  63 y.o. female   PRE-OPERATIVE DIAGNOSIS:  RECURRENT POST MENOPAUSAL BLEEDING   POST-OPERATIVE DIAGNOSIS:  RECURRENT POST MENOPAUSAL BLEEDING, SUSPECTED ENDOMETRIAL POLYP   PROCEDURE:  Procedure(s) with comments: DILATATION AND CURETTAGE /HYSTEROSCOPY (N/A) - NEED MYOSURE LITE   SURGEON:  Surgeons and Role:    * Charlett Nose, MD - Primary   ANESTHESIA:   local and general   EBL:  15 mL    BLOOD ADMINISTERED:none   DRAINS: none    LOCAL MEDICATIONS USED:  LIDOCAINE  and Amount: 10 ml   SPECIMEN:  Source of Specimen:  endometrial curettings   DISPOSITION OF SPECIMEN:  PATHOLOGY   COUNTS:  YES   TOURNIQUET:  * No tourniquets in log *   DICTATION: .Note written in EPIC   PLAN OF CARE: Discharge to home after PACU   PATIENT DISPOSITION:  PACU - hemodynamically stable.  FINDINGS: anteverted uterus sounded to 7cm. On hysterscopic view, bilateral ostia visualized. Endometrial mass at anterior uterine fundus, likely endometrial polyp. Calcifications throughout endometrium. Otherwise thin appearing endometrium. Total fluid deficit .  PROCEDURE IN DETAIL:  The patient was appropriately consented and taken to the operating room where anesthesia was administered without difficulty. Thromboguards were placed and connected. She was placed in the dorsal lithotomy position in stirrups. She was examined under anesthesia and found to have an anteverted uterus. The patient was then prepped and draped in normal sterile fashion. A long speculum was inserted into the vagina. A single-tooth tenaculum was used to grasp the anterior lip of the cervix. A paracervical block was obtained by injecting a total of 10mL of 1% lidocaine at the 4 and 8 o'clock positions at the cervicovaginal junction. The uterus was sounded to 7cm. The cervical os was sequentially dilated using Pratt dilators to accommodate the hysteroscope. The hysteroscope  was introduced under direct visualization and the uterus was distended with normal saline. Bilateral ostia were visualized. Findings as noted above. The Myosure Reach device was used to resect the endometrial mass. Specimen was collected for pathology. Hemostasis was noted in the uterine cavity. The hysteroscope was removed.  The tenaculum was removed from the cervix and good hemostasis was obtained at the puncture sites with silver nitrate. The patient tolerated the procedure well. The instrument and sponge counts were correct times two. The patient was awakened from anesthesia and taken to the recovery room in stable condition.  Derl Barrow, MD 08/11/23 1:05 PM

## 2023-08-11 NOTE — Transfer of Care (Signed)
 Immediate Anesthesia Transfer of Care Note  Patient: Nicole Rasmussen  Procedure(s) Performed: DILATATION AND CURETTAGE /HYSTEROSCOPY (Vagina )  Patient Location: PACU  Anesthesia Type:General  Level of Consciousness: awake, alert , and oriented  Airway & Oxygen Therapy: Patient Spontanous Breathing  Post-op Assessment: Report given to RN and Post -op Vital signs reviewed and stable  Post vital signs: Reviewed and stable  Last Vitals:  Vitals Value Taken Time  BP 125/73 08/11/23 1303  Temp    Pulse 80 08/11/23 1305  Resp 17 08/11/23 1305  SpO2 98 % 08/11/23 1305  Vitals shown include unfiled device data.  Last Pain: There were no vitals filed for this visit.       Complications: No notable events documented.

## 2023-08-11 NOTE — Anesthesia Preprocedure Evaluation (Addendum)
 Anesthesia Evaluation  Patient identified by MRN, date of birth, ID band Patient awake    Reviewed: Allergy & Precautions, H&P , NPO status , Patient's Chart, lab work & pertinent test results  Airway Mallampati: II  TM Distance: >3 FB Neck ROM: Full    Dental no notable dental hx.    Pulmonary shortness of breath, asthma , former smoker   Pulmonary exam normal breath sounds clear to auscultation       Cardiovascular hypertension, Normal cardiovascular exam Rhythm:Regular Rate:Normal     Neuro/Psych  Headaches  negative psych ROS   GI/Hepatic Neg liver ROS,GERD  ,,  Endo/Other  negative endocrine ROS    Renal/GU negative Renal ROS  negative genitourinary   Musculoskeletal  (+) Arthritis ,    Abdominal   Peds negative pediatric ROS (+)  Hematology  (+) Blood dyscrasia, anemia   Anesthesia Other Findings   Reproductive/Obstetrics negative OB ROS                              Anesthesia Physical Anesthesia Plan  ASA: 3  Anesthesia Plan: General   Post-op Pain Management:    Induction: Intravenous  PONV Risk Score and Plan: 3 and Ondansetron, Dexamethasone, Midazolam and Treatment may vary due to age or medical condition  Airway Management Planned: LMA  Additional Equipment: None  Intra-op Plan:   Post-operative Plan: Extubation in OR  Informed Consent: I have reviewed the patients History and Physical, chart, labs and discussed the procedure including the risks, benefits and alternatives for the proposed anesthesia with the patient or authorized representative who has indicated his/her understanding and acceptance.     Dental advisory given  Plan Discussed with: CRNA  Anesthesia Plan Comments:         Anesthesia Quick Evaluation

## 2023-08-12 ENCOUNTER — Encounter (HOSPITAL_COMMUNITY): Payer: Self-pay | Admitting: Obstetrics and Gynecology

## 2023-08-16 LAB — SURGICAL PATHOLOGY

## 2023-08-18 ENCOUNTER — Telehealth: Payer: Self-pay

## 2023-08-18 NOTE — Telephone Encounter (Signed)
 Spoke with Nicole Rasmussen regarding her referral to GYN oncology. She has an appointment scheduled with Dr. Eldonna on 5/12 at 10:30. Patient agrees to date and time. She has been provided with office address and location. She is also aware of our mask and visitor policy. Patient verbalized understanding and will call with any questions.

## 2023-09-08 ENCOUNTER — Encounter: Payer: Self-pay | Admitting: Psychiatry

## 2023-09-11 ENCOUNTER — Telehealth: Payer: Self-pay | Admitting: *Deleted

## 2023-09-11 ENCOUNTER — Telehealth: Payer: Self-pay

## 2023-09-11 ENCOUNTER — Encounter: Payer: Self-pay | Admitting: Psychiatry

## 2023-09-11 ENCOUNTER — Inpatient Hospital Stay: Attending: Psychiatry | Admitting: Psychiatry

## 2023-09-11 ENCOUNTER — Inpatient Hospital Stay: Admitting: Gynecologic Oncology

## 2023-09-11 VITALS — BP 138/77 | HR 63 | Temp 98.8°F | Resp 17 | Ht 68.0 in | Wt 247.2 lb

## 2023-09-11 DIAGNOSIS — N889 Noninflammatory disorder of cervix uteri, unspecified: Secondary | ICD-10-CM | POA: Diagnosis not present

## 2023-09-11 DIAGNOSIS — Z79899 Other long term (current) drug therapy: Secondary | ICD-10-CM | POA: Insufficient documentation

## 2023-09-11 DIAGNOSIS — I10 Essential (primary) hypertension: Secondary | ICD-10-CM | POA: Diagnosis not present

## 2023-09-11 DIAGNOSIS — Z803 Family history of malignant neoplasm of breast: Secondary | ICD-10-CM | POA: Insufficient documentation

## 2023-09-11 DIAGNOSIS — E559 Vitamin D deficiency, unspecified: Secondary | ICD-10-CM | POA: Insufficient documentation

## 2023-09-11 DIAGNOSIS — M1990 Unspecified osteoarthritis, unspecified site: Secondary | ICD-10-CM | POA: Insufficient documentation

## 2023-09-11 DIAGNOSIS — E538 Deficiency of other specified B group vitamins: Secondary | ICD-10-CM | POA: Diagnosis not present

## 2023-09-11 DIAGNOSIS — C541 Malignant neoplasm of endometrium: Secondary | ICD-10-CM

## 2023-09-11 DIAGNOSIS — G709 Myoneural disorder, unspecified: Secondary | ICD-10-CM | POA: Diagnosis not present

## 2023-09-11 DIAGNOSIS — Z87891 Personal history of nicotine dependence: Secondary | ICD-10-CM | POA: Insufficient documentation

## 2023-09-11 DIAGNOSIS — Z6837 Body mass index (BMI) 37.0-37.9, adult: Secondary | ICD-10-CM | POA: Diagnosis not present

## 2023-09-11 DIAGNOSIS — K219 Gastro-esophageal reflux disease without esophagitis: Secondary | ICD-10-CM | POA: Insufficient documentation

## 2023-09-11 DIAGNOSIS — J4540 Moderate persistent asthma, uncomplicated: Secondary | ICD-10-CM | POA: Diagnosis not present

## 2023-09-11 DIAGNOSIS — E8881 Metabolic syndrome: Secondary | ICD-10-CM | POA: Diagnosis not present

## 2023-09-11 DIAGNOSIS — Z8042 Family history of malignant neoplasm of prostate: Secondary | ICD-10-CM | POA: Insufficient documentation

## 2023-09-11 DIAGNOSIS — Z8049 Family history of malignant neoplasm of other genital organs: Secondary | ICD-10-CM | POA: Insufficient documentation

## 2023-09-11 DIAGNOSIS — E785 Hyperlipidemia, unspecified: Secondary | ICD-10-CM | POA: Insufficient documentation

## 2023-09-11 MED ORDER — SENNOSIDES-DOCUSATE SODIUM 8.6-50 MG PO TABS: 2.0000 | ORAL_TABLET | Freq: Every day | ORAL | 0 refills | Status: AC

## 2023-09-11 MED ORDER — TRAMADOL HCL 50 MG PO TABS: 50.0000 mg | ORAL_TABLET | Freq: Four times a day (QID) | ORAL | 0 refills | Status: AC | PRN

## 2023-09-11 NOTE — Progress Notes (Signed)
 Patient here for a consult with Dr. Daisey Dryer and  for a pre-operative appointment prior to her scheduled surgery on 10/10/2023. She is scheduled for a robotic assisted total laparoscopic hysterectomy, bilateral salpingo-oophorectomy, sentinel lymph node biopsy, possible lymph node dissection, possible laparotomy. The surgery was discussed in detail.  See after visit summary for additional details.       Discussed post-op pain management in detail including the aspects of the enhanced recovery pathway.  Advised her that a new prescription would be sent in for Tramadol and it is only to be used for after her upcoming surgery.  We discussed the use of tylenol  post-op and to monitor for a maximum of 4,000 mg in a 24 hour period.  Also prescribed sennakot to be used after surgery and to hold if having loose stools.  Discussed bowel regimen in detail.     Discussed the use of SCDs and measures to take at home to prevent DVT including frequent mobility.  Reportable signs and symptoms of DVT discussed. Post-operative instructions discussed and expectations for after surgery. Incisional care discussed as well including reportable signs and symptoms including erythema, drainage, wound separation.     30 minutes spent with the patient.  Verbalizing understanding of material discussed. No needs or concerns voiced at the end of the visit.   Advised patient to call for any needs.  Advised that her post-operative medications had been prescribed and could be picked up at any time.    This appointment is included in the global surgical bundle as pre-operative teaching and has no charge.

## 2023-09-11 NOTE — H&P (View-Only) (Signed)
 GYNECOLOGIC ONCOLOGY NEW PATIENT CONSULTATION  Date of Service: 09/11/2023 Referring Provider: Zenia Hight, MD   ASSESSMENT AND PLAN: Nicole Rasmussen is a 63 y.o. 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  We reviewed the diagnosis of endometrial intraepithelial neoplasia (EIN) and the treatment options, including medical management (Mirena IUD or progesterone PO) or hysterectomy.    We discussed that surgical management is the recommended treatment unless the patient is not a suitable surgical candidate or otherwise desiring to maintain fertility. Patient is an appropriate candidate for surgery and desires to proceed with surgical management.    The patient is a suitable candidate for hysterectomy via a minimally invasive approach to surgery.  Given that she is postmenopausal, a bilateral salpingo-oophorectomy is also recommended.  We reviewed that robotic assistance would be used to complete the surgery.  We discussed that endometrial cancer is detected in about 40% of final uterine pathology specimens from patients with EIN. Given this, we would recommend evaluation of lymph nodes at time of surgery with a sentinel lymph node evaluation.  We reviewed the sentinel lymph node technique. Risks and benefits of sentinel lymph node biopsy was reviewed. We reviewed the technique and ICG dye. The patient DOES NOT have an iodine allergy  or known liver dysfunction. We reviewed the false negative rate (0.4%), and that 3% of patients with metastatic disease will not have it detected by SLN biopsy in endometrial cancer. A low risk of allergic reaction to the dye, <0.2% for ICG, has been reported. We also discussed that in the case of failed mapping, which occurs 40% of the time, a bilateral or unilateral lymphadenectomy will be performed at the surgeon's discretion.   Potential benefits of sentinel nodes including a higher detection rate for metastasis due to  ultrastaging and potential reduction in operative morbidity. However, there remains uncertainty as to the role for treatment of micrometastatic disease. Further, the benefit of operative morbidity associated with the SLN technique in endometrial cancer is not yet completely known. In other patient populations (e.g. the cervical cancer population) there has been observed reductions in morbidity with SLN biopsy compared to pelvic lymphadenectomy. Lymphedema, nerve dysfunction and lymphocysts are all potential risks with the SLN technique as with complete lymphadenectomy. Additional risks to the patient include the risk of damage to an internal organ while operating in an altered view (e.g. the black and white image of the robotic fluorescence imaging mode).   Patient was consented for: robotic assisted total laparoscopic hysterectomy, bilateral salpingo-oophorectomy, bilateral sentinel lymph node evaluation and biopsy, possible lymph node dissection on 10/10/23.  The risks of surgery were discussed in detail and she understands these to including but not limited to bleeding requiring a blood transfusion, infection, injury to adjacent organs (including but not limited to the bowels, bladder, ureters, nerves, blood vessels), thromboembolic events, wound separation, hernia, vaginal cuff separation, possible risk of lymphedema and lymphocyst if lymphadenectomy performed, unforseen complication, possible need for re-exploration, and medical complications such as heart attack, stroke, pneumonia.  If the patient experiences any of these events, she understands that her hospitalization or recovery may be prolonged and that she may need to take additional medications for a prolonged period. The patient will receive DVT and antibiotic prophylaxis as indicated. She voiced a clear understanding. She had the opportunity to ask questions and informed consent was obtained today. She wishes to proceed.  Additionally a biopsy  of anterior lip of the cervix obtained today. A glandular pink round lesion noted  that may likely represent granulation tissue from recent procedure, but biopsy obtained today to ensure no malignant spread of an endometrial cancer.  Her METs are >4. But given her more frequent albuterol  use lately, will reach out to allergy  doctor to see if able to improve optimization prior to surgery. All preoperative instructions were reviewed. Postoperative expectations were also reviewed. Written handouts were provided to the patient.   A copy of this note was sent to the patient's referring provider.  Derrel Flies, MD Gynecologic Oncology   Medical Decision Making I personally spent  TOTAL 60 minutes face-to-face and non-face-to-face in the care of this patient, which includes all pre, intra, and post visit time on the date of service.   ------------  CC: EIN  HISTORY OF PRESENT ILLNESS:  Nicole Rasmussen is a 64 y.o. woman who is seen in consultation at the request of Zenia Hight, MD for evaluation of EIN.  Patient presented to OB/GYN on 07/26/2023 for postmenopausal bleeding that started a few days earlier.  She had previously undergone an endometrial biopsy in 2020 which was benign and her endometrial stripe at that time was 5 mm.  More recently on 06/12/2023 she had an ultrasound that showed an endometrial stripe of 13.7 mm and a benign endometrial biopsy.  She subsequently underwent a hysteroscopy D&C on 08/11/2023 which noted an endometrial mass of the anterior uterine fundus.  Sampling showed EIN with focal areas concerning for FIGO grade 1 endometrial carcinoma, p53 wild-type.  Today patient reports that she has not had ongoing bleeding in the last few weeks since her D&C.  Reports about a 25 pound weight loss since January while using weight watchers.  Also notes more frequent bowel movements about 2-3 times per day, soft and formed, but not sure if this is related to her diet  changes.  Otherwise denies abdominal bloating, early satiety, change in bladder habits.  In terms of her chronic conditions, she does report that more recently in the past 3 months she has been using her albuterol  more frequently about 1-2 times per day.  Reports that she follows with her PCP and allergy  doctor for this but has not followed up with them since this more frequent albuterol  use.  Follows with neurology for shoulder and knee pain for which she is on gabapentin  and meloxicam.  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  Has to carry a service weapon and belts that weighs about 40 pounds.   PAST MEDICAL HISTORY: Past Medical History:  Diagnosis Date   Acid reflux    Allergy     Anemia    Arthritis    Asthma    no inhalers   B12 deficiency    Dysmetabolic syndrome X    Dyspnea    Food allergy     shellfish   GERD (gastroesophageal reflux disease)    HLD (hyperlipidemia)    Hypertension    Joint pain    knees and back   Lower back pain    Nerve pain    top of right thigh   Neuromuscular disorder (HCC)    bilateral leg pain from back issue   Pre-diabetes    Prediabetes    Shortness of breath    Sinus problem    Swallowing difficulty    Vitamin B 12 deficiency    Vitamin D  deficiency     PAST SURGICAL HISTORY: Past Surgical History:  Procedure Laterality Date   CESAREAN SECTION  1 time   COLONOSCOPY     ESOPHAGOGASTRODUODENOSCOPY     FOOT SURGERY Bilateral 1980's   bunionectomy and hammer toe on both feet   HYSTEROSCOPY WITH D & C N/A 08/11/2023   Procedure: DILATATION AND CURETTAGE /HYSTEROSCOPY;  Surgeon: Theodoro Fisherman, MD;  Location: [facility] OR;  Service: Gynecology;  Laterality: N/A;   MOUTH SURGERY  2011   had implants/ root canal/bridges   WISDOM TOOTH EXTRACTION     with the implants    OB/GYN HISTORY: OB History  Gravida Para Term Preterm AB Living  3 1 1  2 1   SAB IAB Ectopic Multiple Live Births  2     1    # Outcome Date GA Lbr Len/2nd Weight Sex Type Anes PTL Lv  3 Term      CS-LTranv   LIV  2 SAB           1 SAB               Age at menarche: 32 Age at menopause: 31 Hx of HRT: no Hx of STI: yes, HSV Last pap: 05/19/23 NILM, HPV neg History of abnormal pap smears: no  SCREENING STUDIES:  Last mammogram: 2025 Last colonoscopy: 2023  MEDICATIONS:  Current Outpatient Medications:    acetaminophen  (TYLENOL ) 500 MG tablet, Take 1,000 mg by mouth every 6 (six) hours as needed for moderate pain (pain score 4-6)., Disp: , Rfl:    albuterol  (VENTOLIN  HFA) 108 (90 Base) MCG/ACT inhaler, INHALE 2 PUFFS INTO THE LUNGS EVERY 4 HOURS AS NEEDED FOR WHEEZING OR SHORTNESS OF BREATH, Disp: 18 g, Rfl: 1   aspirin EC 81 MG tablet, Take 81 mg by mouth daily., Disp: , Rfl:    atorvastatin (LIPITOR) 10 MG tablet, Take 10 mg by mouth at bedtime., Disp: , Rfl:    carboxymethylcellulose (REFRESH PLUS) 0.5 % SOLN, Place 1 drop into both eyes 3 (three) times daily as needed (dry eyes)., Disp: , Rfl:    cetirizine (ZYRTEC) 10 MG tablet, Take 10 mg by mouth daily., Disp: , Rfl:    cyanocobalamin (VITAMIN B12) 500 MCG tablet, Take 500 mcg by mouth daily., Disp: , Rfl:    cyclobenzaprine (FLEXERIL) 10 MG tablet, Take 10 mg by mouth 3 (three) times daily as needed for muscle spasms., Disp: , Rfl:    EPINEPHrine  (EPIPEN  2-PAK) 0.3 mg/0.3 mL IJ SOAJ injection, Inject 0.3 mg into the muscle as needed for anaphylaxis., Disp: 2 each, Rfl: 1   fluticasone (FLONASE) 50 MCG/ACT nasal spray, Place 2 sprays into both nostrils daily as needed for allergies or rhinitis., Disp: , Rfl:    folic acid  (FOLVITE ) 1 MG tablet, Take 1 tablet (1 mg total) by mouth daily., Disp: 30 tablet, Rfl: 5   gabapentin  (NEURONTIN ) 100 MG capsule, Take 1 capsule (100 mg) in the morning and take 3 capsules (300 mg) in the evening (Patient taking differently: Take 100 mg by mouth 2 (two) times daily as needed (pain).), Disp: 120 capsule, Rfl: 5    meloxicam (MOBIC) 15 MG tablet, Take 15 mg by mouth daily as needed for pain., Disp: , Rfl:    Multiple Vitamins-Minerals (CENTRUM SILVER  50+WOMEN) TABS, Take 1 tablet by mouth daily., Disp: , Rfl:    Multiple Vitamins-Minerals (HAIR SKIN & NAILS PO), Take 1 tablet by mouth daily., Disp: , Rfl:    olmesartan (BENICAR) 20 MG tablet, Take 20 mg by mouth daily., Disp: , Rfl:    senna-docusate (SENOKOT-S) 8.6-50 MG  tablet, Take 2 tablets by mouth at bedtime. For AFTER surgery, do not take if having diarrhea, Disp: 30 tablet, Rfl: 0   traMADol (ULTRAM) 50 MG tablet, Take 1 tablet (50 mg total) by mouth every 6 (six) hours as needed for severe pain (pain score 7-10). For AFTER surgery only, do not take and drive, Disp: 10 tablet, Rfl: 0   gabapentin  (NEURONTIN ) 300 MG capsule, Take 300 mg by mouth at bedtime. (Patient not taking: Reported on 08/11/2023), Disp: , Rfl:    RYALTRIS  665-25 MCG/ACT SUSP, Place 2 sprays into the nose 2 (two) times daily. (Patient not taking: Reported on 08/11/2023), Disp: 29 g, Rfl: 5  ALLERGIES: Allergies  Allergen Reactions   Shellfish Allergy  Hives and Swelling   Latex Hives   Sulfonamide Derivatives Itching and Rash    FAMILY HISTORY: Family History  Problem Relation Age of Onset   Hypertension Mother    Hyperlipidemia Mother    Heart disease Mother    Breast cancer Mother 60   Uterine cancer Mother    Kidney disease Father    Prostate cancer Father    Hyperlipidemia Sister    Diabetes Sister    Hypertension Sister    Hypertension Sister    Hypertension Sister    Hypertension Sister    Breast cancer Sister 66   Hyperlipidemia Brother    Hypertension Brother    Prostate cancer Brother    Hypertension Brother    Hypertension Brother    Hypertension Brother    Colon cancer Neg Hx    Esophageal cancer Neg Hx    Pancreatic cancer Neg Hx    Stomach cancer Neg Hx    Ovarian cancer Neg Hx     SOCIAL HISTORY: Social History   Socioeconomic History    Marital status: Married    Spouse name: Cindie Credit   Number of children: 1   Years of education: BA   Highest education level: Not on file  Occupational History   Occupation: Retired, Lexicographer: [employer]  Tobacco Use   Smoking status: Former    Current packs/day: 0.00    Average packs/day: 1 pack/day for 20.0 years (20.0 ttl pk-yrs)    Types: Cigarettes    Start date: 05/02/1976    Quit date: 05/02/1996    Years since quitting: 27.3    Passive exposure: Never   Smokeless tobacco: Never  Vaping Use   Vaping status: Never Used  Substance and Sexual Activity   Alcohol use: Yes    Comment: social   Drug use: No   Sexual activity: Yes    Birth control/protection: Post-menopausal  Other Topics Concern   Not on file  Social History Narrative   Occasional caffeine use    Right handed    Live in a one level home husband and daughter when home from school   Duty Sheriff    Social Drivers of Health   Financial Resource Strain: Not on file  Food Insecurity: No Food Insecurity (09/08/2023)   Hunger Vital Sign    Worried About Running Out of Food in the Last Year: Never true    Ran Out of Food in the Last Year: Never true  Transportation Needs: No Transportation Needs (09/08/2023)   PRAPARE - Administrator, Civil Service (Medical): No    Lack of Transportation (Non-Medical): No  Physical Activity: Not on file  Stress: Not on file  Social Connections: Not on file  Intimate Partner Violence:  Not At Risk (09/08/2023)   Humiliation, Afraid, Rape, and Kick questionnaire    Fear of Current or Ex-Partner: No    Emotionally Abused: No    Physically Abused: No    Sexually Abused: No    REVIEW OF SYSTEMS: New patient intake form was reviewed.  Complete 10-system review is negative except for the following: Numbness in feet and hands, dizziness, abdominal pain, vaginal discharge, headache, pain in eyes, shortness of breath  PHYSICAL EXAM: BP 138/77 (BP  Location: Left Arm, Patient Position: Sitting)   Pulse 63   Temp 98.8 F (37.1 C) (Oral)   Resp 17   Ht 5\' 8"  (1.727 m)   Wt 247 lb 3.2 oz (112.1 kg)   LMP 10/11/2010   SpO2 100%   BMI 37.59 kg/m  Constitutional: No acute distress. Neuro/Psych: Alert, oriented.  Head and Neck: Normocephalic, atraumatic. Neck symmetric without masses. Sclera anicteric.  Respiratory: Normal work of breathing. Clear to auscultation bilaterally. Cardiovascular: Regular rate and rhythm, no murmurs, rubs, or gallops. Abdomen: Normoactive bowel sounds. Soft, non-distended, non-tender to palpation. No masses appreciated. Well healed pfannenstiel incision. Extremities: Grossly normal range of motion. Warm, well perfused. No edema bilaterally. Skin: No rashes or lesions. Lymphatic: No cervical, supraclavicular, or inguinal adenopathy. Genitourinary: External genitalia without lesions. Urethral meatus without lesions or prolapse. On speculum exam, vagina without lesions. Round glandular lesion on anterior lip of cervix, may be granulation tissue from recent procedure. Bimanual exam reveals normal cervix and small mobile uterus.  Exam chaperoned by Vira Grieves, NP  CERVICAL BIOPSY Marleth Mckendall is a 63 y.o. woman who presents today for a cervical biopsy, see details of the visit above.  The procedure was explained to the patient and verbal consent was obtained prior to the procedure.  The cervix was cleaned with Betadine. A tischler biopsy forceps was used to obtain a biopsy from the anterior cervix.  Hemostasis was achieved with silver  nitrate.  Patient tolerated the procedure well.   LABORATORY AND RADIOLOGIC DATA: Outside medical records were reviewed to synthesize the above history, along with the history and physical obtained during the visit.  Outside laboratory, pathology, and imaging reports were reviewed, with pertinent results below.  I personally reviewed the outside images.  WBC  Date Value Ref  Range Status  08/11/2023 7.1 4.0 - 10.5 K/uL Final   Hemoglobin  Date Value Ref Range Status  08/11/2023 12.7 12.0 - 15.0 g/dL Final  96/08/5407 81.1 11.1 - 15.9 g/dL Final   HCT  Date Value Ref Range Status  08/11/2023 39.5 36.0 - 46.0 % Final   Hematocrit  Date Value Ref Range Status  02/09/2017 42.3 34.0 - 46.6 % Final   Platelets  Date Value Ref Range Status  08/11/2023 159 150 - 400 K/uL Final   Magnesium  Date Value Ref Range Status  11/11/2017 2.1 1.7 - 2.4 mg/dL Final    Comment:    Performed at [facility], [address] [place], [place] [address]   Creatinine, Ser  Date Value Ref Range Status  08/11/2023 0.62 0.44 - 1.00 mg/dL Final   AST  Date Value Ref Range Status  05/04/2023 25 15 - 41 U/L Final   ALT  Date Value Ref Range Status  05/04/2023 30 0 - 44 U/L Final   Surgical pathology (08/11/23): A. ENDOMETRIUM, CURETTAGE:  - Endometrial intraepithelial neoplasia, with focal areas concerning for  FIGO grade 1 endometrial carcinoma.  See comment.  COMMENT:  This case was reviewed with  Dr. Bernetta Brilliant who agrees with the above  diagnosis.  Immunohistochemical stain p53 is pending and will be  reported in an addendum.  ADDENDUM:  -   Immunohistochemical stain p53 shows wild-type expression.  Control  is adequate.    CT ABDOMEN PELVIS W CONTRAST 05/04/2023  Narrative CLINICAL DATA:  Acute abdominal pain, shortness of breath  EXAM: CT ANGIOGRAPHY CHEST  CT ABDOMEN AND PELVIS WITH CONTRAST  TECHNIQUE: Multidetector CT imaging of the chest was performed using the standard protocol during bolus administration of intravenous contrast. Multiplanar CT image reconstructions and MIPs were obtained to evaluate the vascular anatomy. Multidetector CT imaging of the abdomen and pelvis was performed using the standard protocol during bolus administration of intravenous contrast.  RADIATION DOSE REDUCTION: This exam was performed according to  the departmental dose-optimization program which includes automated exposure control, adjustment of the mA and/or kV according to patient size and/or use of iterative reconstruction technique.  CONTRAST:  OMNIPAQUE  IOHEXOL  350 MG/ML SOLN  COMPARISON:  12/21/2022 and 04/25/2019  FINDINGS: CTA CHEST FINDINGS  Cardiovascular: No filling defect is identified in the pulmonary arterial tree to suggest pulmonary embolus. Minimal atheromatous vascular calcification of the aortic arch.  Mediastinum/Nodes: Unremarkable  Lungs/Pleura: 7 by 5 mm right upper lobe nodule on image 29 series 303, no change from 04/25/2019, hence thought to be benign.  Subsolid 12 by 8 by 5 mm right upper lobe nodule on image 41 series 303, stable from earliest available comparison of 11/11/2017, favoring benign etiology.  Musculoskeletal: Thoracic spondylosis.  Review of the MIP images confirms the above findings.  CT ABDOMEN and PELVIS FINDINGS  Hepatobiliary: Unremarkable  Pancreas: Unremarkable  Spleen: Unremarkable  Adrenals/Urinary Tract: 2 mm punctate nonobstructive right mid kidney calculus. No hydronephrosis or hydroureter.  Sub-centimeter hypodense lesions in the right kidney are technically too small to characterize although statistically likely to be cysts. No further imaging workup of these lesions is indicated.  The adrenal glands appear normal.  Stomach/Bowel: Mild sigmoid colon diverticulosis.  Vascular/Lymphatic: Mild aortoiliac atheromatous vascular calcification.  Reproductive: Unremarkable  Other: No supplemental non-categorized findings.  Musculoskeletal: Left foraminal impingement appears severe at L5-S1, moderate at L4-5, and mild at L3-4 due to facet spurring, intervertebral spurring, and disc bulges.  Review of the MIP images confirms the above findings.  IMPRESSION: 1. No filling defect is identified in the pulmonary arterial tree to suggest pulmonary  embolus. 2. 2 mm punctate nonobstructive right mid kidney calculus. 3. Mild sigmoid colon diverticulosis. 4. Left foraminal impingement at L3-4, L4-5, and L5-S1 (most severe at L5-S1). 5. Aortic atherosclerosis.  Aortic Atherosclerosis (ICD10-I70.0).   Electronically Signed By: Freida Jes M.D. On: 05/04/2023 21:54

## 2023-09-11 NOTE — Telephone Encounter (Signed)
 Per provider moved appt from 6/23 to 6/30

## 2023-09-11 NOTE — Telephone Encounter (Signed)
 Surgical clearance form faxed to Ardell Koller MD, for Albuterol  use post op.

## 2023-09-11 NOTE — Progress Notes (Signed)
 GYNECOLOGIC ONCOLOGY NEW PATIENT CONSULTATION  Date of Service: 09/11/2023 Referring Provider: Zenia Hight, MD   ASSESSMENT AND PLAN: Nicole Rasmussen is a 63 y.o. 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  We reviewed the diagnosis of endometrial intraepithelial neoplasia (EIN) and the treatment options, including medical management (Mirena IUD or progesterone PO) or hysterectomy.    We discussed that surgical management is the recommended treatment unless the patient is not a suitable surgical candidate or otherwise desiring to maintain fertility. Patient is an appropriate candidate for surgery and desires to proceed with surgical management.    The patient is a suitable candidate for hysterectomy via a minimally invasive approach to surgery.  Given that she is postmenopausal, a bilateral salpingo-oophorectomy is also recommended.  We reviewed that robotic assistance would be used to complete the surgery.  We discussed that endometrial cancer is detected in about 40% of final uterine pathology specimens from patients with EIN. Given this, we would recommend evaluation of lymph nodes at time of surgery with a sentinel lymph node evaluation.  We reviewed the sentinel lymph node technique. Risks and benefits of sentinel lymph node biopsy was reviewed. We reviewed the technique and ICG dye. The patient DOES NOT have an iodine allergy  or known liver dysfunction. We reviewed the false negative rate (0.4%), and that 3% of patients with metastatic disease will not have it detected by SLN biopsy in endometrial cancer. A low risk of allergic reaction to the dye, <0.2% for ICG, has been reported. We also discussed that in the case of failed mapping, which occurs 40% of the time, a bilateral or unilateral lymphadenectomy will be performed at the surgeon's discretion.   Potential benefits of sentinel nodes including a higher detection rate for metastasis due to  ultrastaging and potential reduction in operative morbidity. However, there remains uncertainty as to the role for treatment of micrometastatic disease. Further, the benefit of operative morbidity associated with the SLN technique in endometrial cancer is not yet completely known. In other patient populations (e.g. the cervical cancer population) there has been observed reductions in morbidity with SLN biopsy compared to pelvic lymphadenectomy. Lymphedema, nerve dysfunction and lymphocysts are all potential risks with the SLN technique as with complete lymphadenectomy. Additional risks to the patient include the risk of damage to an internal organ while operating in an altered view (e.g. the black and white image of the robotic fluorescence imaging mode).   Patient was consented for: robotic assisted total laparoscopic hysterectomy, bilateral salpingo-oophorectomy, bilateral sentinel lymph node evaluation and biopsy, possible lymph node dissection on 10/10/23.  The risks of surgery were discussed in detail and she understands these to including but not limited to bleeding requiring a blood transfusion, infection, injury to adjacent organs (including but not limited to the bowels, bladder, ureters, nerves, blood vessels), thromboembolic events, wound separation, hernia, vaginal cuff separation, possible risk of lymphedema and lymphocyst if lymphadenectomy performed, unforseen complication, possible need for re-exploration, and medical complications such as heart attack, stroke, pneumonia.  If the patient experiences any of these events, she understands that her hospitalization or recovery may be prolonged and that she may need to take additional medications for a prolonged period. The patient will receive DVT and antibiotic prophylaxis as indicated. She voiced a clear understanding. She had the opportunity to ask questions and informed consent was obtained today. She wishes to proceed.  Additionally a biopsy  of anterior lip of the cervix obtained today. A glandular pink round lesion noted  that may likely represent granulation tissue from recent procedure, but biopsy obtained today to ensure no malignant spread of an endometrial cancer.  Her METs are >4. But given her more frequent albuterol  use lately, will reach out to allergy  doctor to see if able to improve optimization prior to surgery. All preoperative instructions were reviewed. Postoperative expectations were also reviewed. Written handouts were provided to the patient.   A copy of this note was sent to the patient's referring provider.  Derrel Flies, MD Gynecologic Oncology   Medical Decision Making I personally spent  TOTAL 60 minutes face-to-face and non-face-to-face in the care of this patient, which includes all pre, intra, and post visit time on the date of service.   ------------  CC: EIN  HISTORY OF PRESENT ILLNESS:  Nicole Rasmussen is a 64 y.o. woman who is seen in consultation at the request of Zenia Hight, MD for evaluation of EIN.  Patient presented to OB/GYN on 07/26/2023 for postmenopausal bleeding that started a few days earlier.  She had previously undergone an endometrial biopsy in 2020 which was benign and her endometrial stripe at that time was 5 mm.  More recently on 06/12/2023 she had an ultrasound that showed an endometrial stripe of 13.7 mm and a benign endometrial biopsy.  She subsequently underwent a hysteroscopy D&C on 08/11/2023 which noted an endometrial mass of the anterior uterine fundus.  Sampling showed EIN with focal areas concerning for FIGO grade 1 endometrial carcinoma, p53 wild-type.  Today patient reports that she has not had ongoing bleeding in the last few weeks since her D&C.  Reports about a 25 pound weight loss since January while using weight watchers.  Also notes more frequent bowel movements about 2-3 times per day, soft and formed, but not sure if this is related to her diet  changes.  Otherwise denies abdominal bloating, early satiety, change in bladder habits.  In terms of her chronic conditions, she does report that more recently in the past 3 months she has been using her albuterol  more frequently about 1-2 times per day.  Reports that she follows with her PCP and allergy  doctor for this but has not followed up with them since this more frequent albuterol  use.  Follows with neurology for shoulder and knee pain for which she is on gabapentin  and meloxicam.  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  Has to carry a service weapon and belts that weighs about 40 pounds.   PAST MEDICAL HISTORY: Past Medical History:  Diagnosis Date   Acid reflux    Allergy     Anemia    Arthritis    Asthma    no inhalers   B12 deficiency    Dysmetabolic syndrome X    Dyspnea    Food allergy     shellfish   GERD (gastroesophageal reflux disease)    HLD (hyperlipidemia)    Hypertension    Joint pain    knees and back   Lower back pain    Nerve pain    top of right thigh   Neuromuscular disorder (HCC)    bilateral leg pain from back issue   Pre-diabetes    Prediabetes    Shortness of breath    Sinus problem    Swallowing difficulty    Vitamin B 12 deficiency    Vitamin D  deficiency     PAST SURGICAL HISTORY: Past Surgical History:  Procedure Laterality Date   CESAREAN SECTION  1 time   COLONOSCOPY     ESOPHAGOGASTRODUODENOSCOPY     FOOT SURGERY Bilateral 1980's   bunionectomy and hammer toe on both feet   HYSTEROSCOPY WITH D & C N/A 08/11/2023   Procedure: DILATATION AND CURETTAGE /HYSTEROSCOPY;  Surgeon: Theodoro Fisherman, MD;  Location: [facility] OR;  Service: Gynecology;  Laterality: N/A;   MOUTH SURGERY  2011   had implants/ root canal/bridges   WISDOM TOOTH EXTRACTION     with the implants    OB/GYN HISTORY: OB History  Gravida Para Term Preterm AB Living  3 1 1  2 1   SAB IAB Ectopic Multiple Live Births  2     1    # Outcome Date GA Lbr Len/2nd Weight Sex Type Anes PTL Lv  3 Term      CS-LTranv   LIV  2 SAB           1 SAB               Age at menarche: 32 Age at menopause: 31 Hx of HRT: no Hx of STI: yes, HSV Last pap: 05/19/23 NILM, HPV neg History of abnormal pap smears: no  SCREENING STUDIES:  Last mammogram: 2025 Last colonoscopy: 2023  MEDICATIONS:  Current Outpatient Medications:    acetaminophen  (TYLENOL ) 500 MG tablet, Take 1,000 mg by mouth every 6 (six) hours as needed for moderate pain (pain score 4-6)., Disp: , Rfl:    albuterol  (VENTOLIN  HFA) 108 (90 Base) MCG/ACT inhaler, INHALE 2 PUFFS INTO THE LUNGS EVERY 4 HOURS AS NEEDED FOR WHEEZING OR SHORTNESS OF BREATH, Disp: 18 g, Rfl: 1   aspirin EC 81 MG tablet, Take 81 mg by mouth daily., Disp: , Rfl:    atorvastatin (LIPITOR) 10 MG tablet, Take 10 mg by mouth at bedtime., Disp: , Rfl:    carboxymethylcellulose (REFRESH PLUS) 0.5 % SOLN, Place 1 drop into both eyes 3 (three) times daily as needed (dry eyes)., Disp: , Rfl:    cetirizine (ZYRTEC) 10 MG tablet, Take 10 mg by mouth daily., Disp: , Rfl:    cyanocobalamin (VITAMIN B12) 500 MCG tablet, Take 500 mcg by mouth daily., Disp: , Rfl:    cyclobenzaprine (FLEXERIL) 10 MG tablet, Take 10 mg by mouth 3 (three) times daily as needed for muscle spasms., Disp: , Rfl:    EPINEPHrine  (EPIPEN  2-PAK) 0.3 mg/0.3 mL IJ SOAJ injection, Inject 0.3 mg into the muscle as needed for anaphylaxis., Disp: 2 each, Rfl: 1   fluticasone (FLONASE) 50 MCG/ACT nasal spray, Place 2 sprays into both nostrils daily as needed for allergies or rhinitis., Disp: , Rfl:    folic acid  (FOLVITE ) 1 MG tablet, Take 1 tablet (1 mg total) by mouth daily., Disp: 30 tablet, Rfl: 5   gabapentin  (NEURONTIN ) 100 MG capsule, Take 1 capsule (100 mg) in the morning and take 3 capsules (300 mg) in the evening (Patient taking differently: Take 100 mg by mouth 2 (two) times daily as needed (pain).), Disp: 120 capsule, Rfl: 5    meloxicam (MOBIC) 15 MG tablet, Take 15 mg by mouth daily as needed for pain., Disp: , Rfl:    Multiple Vitamins-Minerals (CENTRUM SILVER  50+WOMEN) TABS, Take 1 tablet by mouth daily., Disp: , Rfl:    Multiple Vitamins-Minerals (HAIR SKIN & NAILS PO), Take 1 tablet by mouth daily., Disp: , Rfl:    olmesartan (BENICAR) 20 MG tablet, Take 20 mg by mouth daily., Disp: , Rfl:    senna-docusate (SENOKOT-S) 8.6-50 MG  tablet, Take 2 tablets by mouth at bedtime. For AFTER surgery, do not take if having diarrhea, Disp: 30 tablet, Rfl: 0   traMADol (ULTRAM) 50 MG tablet, Take 1 tablet (50 mg total) by mouth every 6 (six) hours as needed for severe pain (pain score 7-10). For AFTER surgery only, do not take and drive, Disp: 10 tablet, Rfl: 0   gabapentin  (NEURONTIN ) 300 MG capsule, Take 300 mg by mouth at bedtime. (Patient not taking: Reported on 08/11/2023), Disp: , Rfl:    RYALTRIS  665-25 MCG/ACT SUSP, Place 2 sprays into the nose 2 (two) times daily. (Patient not taking: Reported on 08/11/2023), Disp: 29 g, Rfl: 5  ALLERGIES: Allergies  Allergen Reactions   Shellfish Allergy  Hives and Swelling   Latex Hives   Sulfonamide Derivatives Itching and Rash    FAMILY HISTORY: Family History  Problem Relation Age of Onset   Hypertension Mother    Hyperlipidemia Mother    Heart disease Mother    Breast cancer Mother 60   Uterine cancer Mother    Kidney disease Father    Prostate cancer Father    Hyperlipidemia Sister    Diabetes Sister    Hypertension Sister    Hypertension Sister    Hypertension Sister    Hypertension Sister    Breast cancer Sister 66   Hyperlipidemia Brother    Hypertension Brother    Prostate cancer Brother    Hypertension Brother    Hypertension Brother    Hypertension Brother    Colon cancer Neg Hx    Esophageal cancer Neg Hx    Pancreatic cancer Neg Hx    Stomach cancer Neg Hx    Ovarian cancer Neg Hx     SOCIAL HISTORY: Social History   Socioeconomic History    Marital status: Married    Spouse name: Cindie Credit   Number of children: 1   Years of education: BA   Highest education level: Not on file  Occupational History   Occupation: Retired, Lexicographer: [employer]  Tobacco Use   Smoking status: Former    Current packs/day: 0.00    Average packs/day: 1 pack/day for 20.0 years (20.0 ttl pk-yrs)    Types: Cigarettes    Start date: 05/02/1976    Quit date: 05/02/1996    Years since quitting: 27.3    Passive exposure: Never   Smokeless tobacco: Never  Vaping Use   Vaping status: Never Used  Substance and Sexual Activity   Alcohol use: Yes    Comment: social   Drug use: No   Sexual activity: Yes    Birth control/protection: Post-menopausal  Other Topics Concern   Not on file  Social History Narrative   Occasional caffeine use    Right handed    Live in a one level home husband and daughter when home from school   Duty Sheriff    Social Drivers of Health   Financial Resource Strain: Not on file  Food Insecurity: No Food Insecurity (09/08/2023)   Hunger Vital Sign    Worried About Running Out of Food in the Last Year: Never true    Ran Out of Food in the Last Year: Never true  Transportation Needs: No Transportation Needs (09/08/2023)   PRAPARE - Administrator, Civil Service (Medical): No    Lack of Transportation (Non-Medical): No  Physical Activity: Not on file  Stress: Not on file  Social Connections: Not on file  Intimate Partner Violence:  Not At Risk (09/08/2023)   Humiliation, Afraid, Rape, and Kick questionnaire    Fear of Current or Ex-Partner: No    Emotionally Abused: No    Physically Abused: No    Sexually Abused: No    REVIEW OF SYSTEMS: New patient intake form was reviewed.  Complete 10-system review is negative except for the following: Numbness in feet and hands, dizziness, abdominal pain, vaginal discharge, headache, pain in eyes, shortness of breath  PHYSICAL EXAM: BP 138/77 (BP  Location: Left Arm, Patient Position: Sitting)   Pulse 63   Temp 98.8 F (37.1 C) (Oral)   Resp 17   Ht 5\' 8"  (1.727 m)   Wt 247 lb 3.2 oz (112.1 kg)   LMP 10/11/2010   SpO2 100%   BMI 37.59 kg/m  Constitutional: No acute distress. Neuro/Psych: Alert, oriented.  Head and Neck: Normocephalic, atraumatic. Neck symmetric without masses. Sclera anicteric.  Respiratory: Normal work of breathing. Clear to auscultation bilaterally. Cardiovascular: Regular rate and rhythm, no murmurs, rubs, or gallops. Abdomen: Normoactive bowel sounds. Soft, non-distended, non-tender to palpation. No masses appreciated. Well healed pfannenstiel incision. Extremities: Grossly normal range of motion. Warm, well perfused. No edema bilaterally. Skin: No rashes or lesions. Lymphatic: No cervical, supraclavicular, or inguinal adenopathy. Genitourinary: External genitalia without lesions. Urethral meatus without lesions or prolapse. On speculum exam, vagina without lesions. Round glandular lesion on anterior lip of cervix, may be granulation tissue from recent procedure. Bimanual exam reveals normal cervix and small mobile uterus.  Exam chaperoned by Vira Grieves, NP  CERVICAL BIOPSY Marleth Mckendall is a 63 y.o. woman who presents today for a cervical biopsy, see details of the visit above.  The procedure was explained to the patient and verbal consent was obtained prior to the procedure.  The cervix was cleaned with Betadine. A tischler biopsy forceps was used to obtain a biopsy from the anterior cervix.  Hemostasis was achieved with silver  nitrate.  Patient tolerated the procedure well.   LABORATORY AND RADIOLOGIC DATA: Outside medical records were reviewed to synthesize the above history, along with the history and physical obtained during the visit.  Outside laboratory, pathology, and imaging reports were reviewed, with pertinent results below.  I personally reviewed the outside images.  WBC  Date Value Ref  Range Status  08/11/2023 7.1 4.0 - 10.5 K/uL Final   Hemoglobin  Date Value Ref Range Status  08/11/2023 12.7 12.0 - 15.0 g/dL Final  96/08/5407 81.1 11.1 - 15.9 g/dL Final   HCT  Date Value Ref Range Status  08/11/2023 39.5 36.0 - 46.0 % Final   Hematocrit  Date Value Ref Range Status  02/09/2017 42.3 34.0 - 46.6 % Final   Platelets  Date Value Ref Range Status  08/11/2023 159 150 - 400 K/uL Final   Magnesium  Date Value Ref Range Status  11/11/2017 2.1 1.7 - 2.4 mg/dL Final    Comment:    Performed at [facility], [address] [place], [place] [address]   Creatinine, Ser  Date Value Ref Range Status  08/11/2023 0.62 0.44 - 1.00 mg/dL Final   AST  Date Value Ref Range Status  05/04/2023 25 15 - 41 U/L Final   ALT  Date Value Ref Range Status  05/04/2023 30 0 - 44 U/L Final   Surgical pathology (08/11/23): A. ENDOMETRIUM, CURETTAGE:  - Endometrial intraepithelial neoplasia, with focal areas concerning for  FIGO grade 1 endometrial carcinoma.  See comment.  COMMENT:  This case was reviewed with  Dr. Bernetta Brilliant who agrees with the above  diagnosis.  Immunohistochemical stain p53 is pending and will be  reported in an addendum.  ADDENDUM:  -   Immunohistochemical stain p53 shows wild-type expression.  Control  is adequate.    CT ABDOMEN PELVIS W CONTRAST 05/04/2023  Narrative CLINICAL DATA:  Acute abdominal pain, shortness of breath  EXAM: CT ANGIOGRAPHY CHEST  CT ABDOMEN AND PELVIS WITH CONTRAST  TECHNIQUE: Multidetector CT imaging of the chest was performed using the standard protocol during bolus administration of intravenous contrast. Multiplanar CT image reconstructions and MIPs were obtained to evaluate the vascular anatomy. Multidetector CT imaging of the abdomen and pelvis was performed using the standard protocol during bolus administration of intravenous contrast.  RADIATION DOSE REDUCTION: This exam was performed according to  the departmental dose-optimization program which includes automated exposure control, adjustment of the mA and/or kV according to patient size and/or use of iterative reconstruction technique.  CONTRAST:  OMNIPAQUE  IOHEXOL  350 MG/ML SOLN  COMPARISON:  12/21/2022 and 04/25/2019  FINDINGS: CTA CHEST FINDINGS  Cardiovascular: No filling defect is identified in the pulmonary arterial tree to suggest pulmonary embolus. Minimal atheromatous vascular calcification of the aortic arch.  Mediastinum/Nodes: Unremarkable  Lungs/Pleura: 7 by 5 mm right upper lobe nodule on image 29 series 303, no change from 04/25/2019, hence thought to be benign.  Subsolid 12 by 8 by 5 mm right upper lobe nodule on image 41 series 303, stable from earliest available comparison of 11/11/2017, favoring benign etiology.  Musculoskeletal: Thoracic spondylosis.  Review of the MIP images confirms the above findings.  CT ABDOMEN and PELVIS FINDINGS  Hepatobiliary: Unremarkable  Pancreas: Unremarkable  Spleen: Unremarkable  Adrenals/Urinary Tract: 2 mm punctate nonobstructive right mid kidney calculus. No hydronephrosis or hydroureter.  Sub-centimeter hypodense lesions in the right kidney are technically too small to characterize although statistically likely to be cysts. No further imaging workup of these lesions is indicated.  The adrenal glands appear normal.  Stomach/Bowel: Mild sigmoid colon diverticulosis.  Vascular/Lymphatic: Mild aortoiliac atheromatous vascular calcification.  Reproductive: Unremarkable  Other: No supplemental non-categorized findings.  Musculoskeletal: Left foraminal impingement appears severe at L5-S1, moderate at L4-5, and mild at L3-4 due to facet spurring, intervertebral spurring, and disc bulges.  Review of the MIP images confirms the above findings.  IMPRESSION: 1. No filling defect is identified in the pulmonary arterial tree to suggest pulmonary  embolus. 2. 2 mm punctate nonobstructive right mid kidney calculus. 3. Mild sigmoid colon diverticulosis. 4. Left foraminal impingement at L3-4, L4-5, and L5-S1 (most severe at L5-S1). 5. Aortic atherosclerosis.  Aortic Atherosclerosis (ICD10-I70.0).   Electronically Signed By: Freida Jes M.D. On: 05/04/2023 21:54

## 2023-09-11 NOTE — Patient Instructions (Addendum)
 Preparing for your Surgery  Plan for surgery on October 10, 2023 with Dr. Derrel Flies at [facility]. You will be scheduled for robotic assisted total laparoscopic hysterectomy (removal of the uterus and cervix), bilateral salpingo-oophorectomy (removal of both ovaries and fallopian tubes), sentinel lymph node biopsy, possible lymph node dissection, possible laparotomy (larger incision on your abdomen if needed).   Pre-operative Testing -You will receive a phone call from presurgical testing at [facility] to arrange for a pre-operative appointment and lab work.  -Bring your insurance card, copy of an advanced directive if applicable, medication list  -At that visit, you will be asked to sign a consent for a possible blood transfusion in case a transfusion becomes necessary during surgery.  The need for a blood transfusion is rare but having consent is a necessary part of your care.     -You can continue taking your baby aspirin before surgery with your LAST DOSE being the DAY BEFORE surgery in the morning. DO NOT TAKE THE MORNING OF SURGERY.  YOU WILL NEED TO HOLD ON TAKING MOBIC FOR AT LEAST 7 DAYS BEFORE SURGERY.  -Do not take supplements such as fish oil (omega 3), red yeast rice, turmeric before your surgery. STOP TAKING AT LEAST 10 DAYS BEFORE SURGERY. You want to avoid medications with aspirin in them including headache powders such as BC or Goody's), Excedrin migraine.  Day Before Surgery at Home -You will be asked to take in a light diet the day before surgery. You will be advised you can have clear liquids up until 3 hours before your surgery.    Eat a light diet the day before surgery.  Examples including soups, broths, toast, yogurt, mashed potatoes.  AVOID GAS PRODUCING FOODS AND BEVERAGES. Things to avoid include carbonated beverages (fizzy beverages, sodas), raw fruits and raw vegetables (uncooked), or beans.   If your bowels are filled with gas, your surgeon  will have difficulty visualizing your pelvic organs which increases your surgical risks.  Your role in recovery Your role is to become active as soon as directed by your doctor, while still giving yourself time to heal.  Rest when you feel tired. You will be asked to do the following in order to speed your recovery:  - Cough and breathe deeply. This helps to clear and expand your lungs and can prevent pneumonia after surgery.  - STAY ACTIVE WHEN YOU GET HOME. Do mild physical activity. Walking or moving your legs help your circulation and body functions return to normal. Do not try to get up or walk alone the first time after surgery.   -If you develop swelling on one leg or the other, pain in the back of your leg, redness/warmth in one of your legs, please call the office or go to the Emergency Room to have a doppler to rule out a blood clot. For shortness of breath, chest pain-seek care in the Emergency Room as soon as possible. - Actively manage your pain. Managing your pain lets you move in comfort. We will ask you to rate your pain on a scale of zero to 10. It is your responsibility to tell your doctor or nurse where and how much you hurt so your pain can be treated.  Special Considerations -If you are diabetic, you may be placed on insulin  after surgery to have closer control over your blood sugars to promote healing and recovery.  This does not mean that you will be discharged on insulin .  If  applicable, your oral antidiabetics will be resumed when you are tolerating a solid diet.  -Your final pathology results from surgery should be available around one week after surgery and the results will be relayed to you when available.  -Dr. Abdul Hodgkin is the surgeon that assists your GYN Oncologist with surgery.  If you end up staying the night, the next day after your surgery you will either see Dr. Orvil Bland, Dr. Daisey Dryer, or Dr. Abdul Hodgkin.  -FMLA forms can be faxed to [phone] and  please allow 5-7 business days for completion.  Pain Management After Surgery -You will be prescribed your pain medication and bowel regimen medications before surgery so that you can have these available when you are discharged from the hospital. The pain medication is for use ONLY AFTER surgery and a new prescription will not be given.   -Make sure that you have Tylenol  and Ibuprofen  (OR MOBIC, DO NOT TAKE TOGETHER SINCE THEY WORK SIMILARLY) IF YOU ARE ABLE TO TAKE THESE MEDICATIONS at home to use on a regular basis after surgery for pain control. We recommend alternating the medications every hour to six hours since they work differently and are processed in the body differently for pain relief.  -Review the attached handout on narcotic use and their risks and side effects.   Bowel Regimen -You will be prescribed Sennakot-S to take nightly to prevent constipation especially if you are taking the narcotic pain medication intermittently.  It is important to prevent constipation and drink adequate amounts of liquids. You can stop taking this medication when you are not taking pain medication and you are back on your normal bowel routine.  Risks of Surgery Risks of surgery are low but include bleeding, infection, damage to surrounding structures, re-operation, blood clots, and very rarely death.   Blood Transfusion Information (For the consent to be signed before surgery)  We will be checking your blood type before surgery so in case of emergencies, we will know what type of blood you would need.                                            WHAT IS A BLOOD TRANSFUSION?  A transfusion is the replacement of blood or some of its parts. Blood is made up of multiple cells which provide different functions. Red blood cells carry oxygen and are used for blood loss replacement. White blood cells fight against infection. Platelets control bleeding. Plasma helps clot blood. Other blood products are  available for specialized needs, such as hemophilia or other clotting disorders. BEFORE THE TRANSFUSION  Who gives blood for transfusions?  You may be able to donate blood to be used at a later date on yourself (autologous donation). Relatives can be asked to donate blood. This is generally not any safer than if you have received blood from a stranger. The same precautions are taken to ensure safety when a relative's blood is donated. Healthy volunteers who are fully evaluated to make sure their blood is safe. This is blood bank blood. Transfusion therapy is the safest it has ever been in the practice of medicine. Before blood is taken from a donor, a complete history is taken to make sure that person has no history of diseases nor engages in risky social behavior (examples are intravenous drug use or sexual activity with multiple partners). The donor's travel history is screened  to minimize risk of transmitting infections, such as malaria. The donated blood is tested for signs of infectious diseases, such as HIV and hepatitis. The blood is then tested to be sure it is compatible with you in order to minimize the chance of a transfusion reaction. If you or a relative donates blood, this is often done in anticipation of surgery and is not appropriate for emergency situations. It takes many days to process the donated blood. RISKS AND COMPLICATIONS Although transfusion therapy is very safe and saves many lives, the main dangers of transfusion include:  Getting an infectious disease. Developing a transfusion reaction. This is an allergic reaction to something in the blood you were given. Every precaution is taken to prevent this. The decision to have a blood transfusion has been considered carefully by your caregiver before blood is given. Blood is not given unless the benefits outweigh the risks.  AFTER SURGERY INSTRUCTIONS  Return to work: 4-6 weeks if applicable  Activity: 1. Be up and out of the  bed during the day.  Take a nap if needed.  You may walk up steps but be careful and use the hand rail.  Stair climbing will tire you more than you think, you may need to stop part way and rest.   2. No lifting or straining for 6 weeks over 10 pounds. No pushing, pulling, straining for 6 weeks.  3. No driving for 0-45 days when the following criteria have been met: Do not drive if you are taking narcotic pain medicine and make sure that your reaction time has returned.   4. You can shower as soon as the next day after surgery. Shower daily.  Use your regular soap and water (not directly on the incision) and pat your incision(s) dry afterwards; don't rub.  No tub baths or submerging your body in water until cleared by your surgeon. If you have the soap that was given to you by pre-surgical testing that was used before surgery, you do not need to use it afterwards because this can irritate your incisions.   5. No sexual activity and nothing in the vagina for 12 weeks.  6. You may experience a small amount of clear drainage from your incisions, which is normal.  If the drainage persists, increases, or changes color please call the office.  7. Do not use creams, lotions, or ointments such as neosporin on your incisions after surgery until advised by your surgeon because they can cause removal of the dermabond glue on your incisions.    8. You may experience vaginal spotting after surgery or when the stitches at the top of the vagina begin to dissolve.  The spotting is normal but if you experience heavy bleeding, call our office.  9. Take Tylenol  or ibuprofen  (OR MOBIC (MELOXICAM), DO NOT TAKE TOGETHER SINCE THEY WORK SIMILARLY) first for pain if you are able to take these medications and only use narcotic pain medication for severe pain not relieved by the Tylenol  or Ibuprofen .  Monitor your Tylenol  intake to a max of 4,000 mg in a 24 hour period. You can alternate these medications after  surgery.  Diet: 1. Low sodium Heart Healthy Diet is recommended but you are cleared to resume your normal (before surgery) diet after your procedure.  2. It is safe to use a laxative, such as Miralax or Colace, if you have difficulty moving your bowels before surgery. You have been prescribed Sennakot-S to take at bedtime every evening after surgery to  keep bowel movements regular and to prevent constipation.    Wound Care: 1. Keep clean and dry.  Shower daily.  Reasons to call the Doctor: Fever - Oral temperature greater than 100.4 degrees Fahrenheit Foul-smelling vaginal discharge Difficulty urinating Nausea and vomiting Increased pain at the site of the incision that is unrelieved with pain medicine. Difficulty breathing with or without chest pain New calf pain especially if only on one side Sudden, continuing increased vaginal bleeding with or without clots.   Contacts: For questions or concerns you should contact:  Dr. Derrel Flies at [phone]  Vira Grieves, NP at [phone]  After Hours: call [phone] and have the GYN Oncologist paged/contacted (after 5 pm or on the weekends). You will speak with an after hours RN and let he or she know you have had surgery.  Messages sent via mychart are for non-urgent matters and are not responded to after hours so for urgent needs, please call the after hours number.

## 2023-09-13 ENCOUNTER — Encounter: Payer: Self-pay | Admitting: Obstetrics and Gynecology

## 2023-09-13 LAB — SURGICAL PATHOLOGY

## 2023-09-18 ENCOUNTER — Ambulatory Visit: Payer: Self-pay | Admitting: Psychiatry

## 2023-09-18 NOTE — Telephone Encounter (Signed)
 Surgical clearance form received from Dr.Smith, PCP. Medium risk, Asthma is mild intermittent but needs appt ASAP if resume inhaler greater than 2x/week.   Clearance form given to provider for review.

## 2023-09-20 NOTE — Telephone Encounter (Signed)
 Nicole Rasmussen is aware of recommendations from PCP regarding needing appointment if resumes using inhaler >2x a week. She voiced an understanding stating she hasn't used it in awhile.

## 2023-09-28 ENCOUNTER — Telehealth: Payer: Self-pay | Admitting: *Deleted

## 2023-09-28 NOTE — Telephone Encounter (Signed)
 Spoke with Nicole Rasmussen in regards to moving her surgery up to Tuesday, June 3rd. Pt states she is unable to move her surgery from June 10 th to June 3rd. Message will be relayed to providers.

## 2023-10-04 ENCOUNTER — Encounter: Payer: Self-pay | Admitting: Allergy

## 2023-10-04 ENCOUNTER — Other Ambulatory Visit: Payer: Self-pay

## 2023-10-04 ENCOUNTER — Ambulatory Visit (INDEPENDENT_AMBULATORY_CARE_PROVIDER_SITE_OTHER): Admitting: Allergy

## 2023-10-04 VITALS — BP 122/84 | HR 64 | Temp 97.8°F | Resp 16

## 2023-10-04 DIAGNOSIS — J3089 Other allergic rhinitis: Secondary | ICD-10-CM | POA: Diagnosis not present

## 2023-10-04 DIAGNOSIS — R0602 Shortness of breath: Secondary | ICD-10-CM | POA: Diagnosis not present

## 2023-10-04 DIAGNOSIS — T7800XD Anaphylactic reaction due to unspecified food, subsequent encounter: Secondary | ICD-10-CM

## 2023-10-04 DIAGNOSIS — J302 Other seasonal allergic rhinitis: Secondary | ICD-10-CM | POA: Diagnosis not present

## 2023-10-04 MED ORDER — RYALTRIS 665-25 MCG/ACT NA SUSP: 2.0000 | Freq: Two times a day (BID) | NASAL | 5 refills | Status: DC

## 2023-10-04 MED ORDER — LEVOCETIRIZINE DIHYDROCHLORIDE 5 MG PO TABS: 5.0000 mg | ORAL_TABLET | Freq: Every evening | ORAL | 5 refills | Status: DC

## 2023-10-04 MED ORDER — NEFFY 2 MG/0.1ML NA SOLN: 1.0000 | NASAL | 1 refills | Status: AC | PRN

## 2023-10-04 MED ORDER — ALBUTEROL SULFATE HFA 108 (90 BASE) MCG/ACT IN AERS: 2.0000 | INHALATION_SPRAY | RESPIRATORY_TRACT | 1 refills | Status: AC | PRN

## 2023-10-04 NOTE — Progress Notes (Signed)
 Follow-up Note  RE: Nicole Rasmussen MRN: [id number] DOB: 10-07-1960 Date of Office Visit: 10/04/2023   History of present illness: Nicole Rasmussen is a 63 y.o. female presenting today for follow-up of allergic rhinitis.  She was last seen in the office on 08/19/22 by myself.  Discussed the use of AI scribe software for clinical note transcription with the patient, who gave verbal consent to proceed.  She was recently diagnosed with endometrial cancer following episodes of spotting, which led to an OB GYN consultation and subsequent procedures that confirmed the cancer. She is scheduled for a complete hysterectomy next week and is not currently undergoing chemotherapy.  She has a history of shortness of breath/asthma and previously used an albuterol  inhaler daily, sometimes twice a day, due to frequent shortness of breath. However, she has not used the inhaler in the past two months, coinciding with a 30-pound intentional weight loss through [employer]. She has noticed improvement in her breathing since her weight loss.  She has a shellfish allergy  and carries an EpiPen , which she has not needed to use in the past year. She avoids shellfish but can eat other types of fish.  She reports chronic nasal symptoms, including a constantly runny nose and occasional epistaxis. She has been out of her Ryaltris  nasal spray for a long time, which she finds effective for her symptoms. She has been using over-the-counter Zyrtec, which has not been helpful. She experiences significant sinus pressure and has not tried Xyzal before.  Her social history includes a past smoking habit from college until age 32, and she has a nodule on her right lung that is being monitored by Dr Gaynell Keeler in pulmonology but is stable.     Review of systems: 10pt ROS negative unless noted above in HPI  Past medical/social/surgical/family history have been reviewed and are unchanged unless specifically indicated  below.  No changes  Medication List: Current Outpatient Medications  Medication Sig Dispense Refill   acetaminophen  (TYLENOL ) 500 MG tablet Take 1,000 mg by mouth every 6 (six) hours as needed for moderate pain (pain score 4-6).     aspirin EC 81 MG tablet Take 81 mg by mouth daily.     atorvastatin (LIPITOR) 10 MG tablet Take 10 mg by mouth at bedtime.     carboxymethylcellulose (REFRESH PLUS) 0.5 % SOLN Place 1 drop into both eyes 3 (three) times daily as needed (dry eyes).     cetirizine (ZYRTEC) 10 MG tablet Take 10 mg by mouth daily.     cyanocobalamin (VITAMIN B12) 500 MCG tablet Take 500 mcg by mouth daily.     cyclobenzaprine (FLEXERIL) 10 MG tablet Take 10 mg by mouth 3 (three) times daily as needed for muscle spasms.     EPINEPHrine  (EPIPEN  2-PAK) 0.3 mg/0.3 mL IJ SOAJ injection Inject 0.3 mg into the muscle as needed for anaphylaxis. 2 each 1   EPINEPHrine  (NEFFY) 2 MG/0.1ML SOLN Place 1 spray into the nose as needed. 4 each 1   folic acid  (FOLVITE ) 1 MG tablet Take 1 tablet (1 mg total) by mouth daily. 30 tablet 5   gabapentin  (NEURONTIN ) 100 MG capsule Take 1 capsule (100 mg) in the morning and take 3 capsules (300 mg) in the evening (Patient taking differently: Take 100 mg by mouth 2 (two) times daily as needed (pain).) 120 capsule 5   gabapentin  (NEURONTIN ) 300 MG capsule Take 300 mg by mouth at bedtime as needed (Nerve Pain).     levocetirizine (XYZAL) 5  MG tablet Take 1 tablet (5 mg total) by mouth every evening. 30 tablet 5   meloxicam (MOBIC) 15 MG tablet Take 15 mg by mouth daily as needed for pain.     Multiple Vitamins-Minerals (CENTRUM SILVER  50+WOMEN) TABS Take 1 tablet by mouth daily.     Multiple Vitamins-Minerals (HAIR SKIN & NAILS PO) Take 1 tablet by mouth daily.     olmesartan (BENICAR) 20 MG tablet Take 20 mg by mouth daily.     senna-docusate (SENOKOT-S) 8.6-50 MG tablet Take 2 tablets by mouth at bedtime. For AFTER surgery, do not take if having diarrhea 30  tablet 0   traMADol  (ULTRAM ) 50 MG tablet Take 1 tablet (50 mg total) by mouth every 6 (six) hours as needed for severe pain (pain score 7-10). For AFTER surgery only, do not take and drive 10 tablet 0   albuterol  (VENTOLIN  HFA) 108 (90 Base) MCG/ACT inhaler Inhale 2 puffs into the lungs every 4 (four) hours as needed for wheezing or shortness of breath. 18 g 1   fluticasone (FLONASE) 50 MCG/ACT nasal spray Place 2 sprays into both nostrils daily as needed for allergies or rhinitis. (Patient not taking: Reported on 10/04/2023)     RYALTRIS  665-25 MCG/ACT SUSP Place 2 sprays into the nose 2 (two) times daily. 29 g 5   No current facility-administered medications for this visit.     Known medication allergies: Allergies  Allergen Reactions   Shellfish Allergy  Hives and Swelling   Latex Hives   Sulfonamide Derivatives Itching and Rash     Physical examination: Blood pressure 122/84, pulse 64, temperature 97.8 F (36.6 C), temperature source Temporal, resp. rate 16, last menstrual period 10/11/2010, SpO2 99%.  General: Alert, interactive, in no acute distress. HEENT: PERRLA, TMs pearly gray, turbinates moderately edematous without discharge, post-pharynx non erythematous. Neck: Supple without lymphadenopathy. Lungs: Clear to auscultation without wheezing, rhonchi or rales. {no increased work of breathing. CV: Normal S1, S2 without murmurs. Abdomen: Nondistended, nontender. Skin: Warm and dry, without lesions or rashes. Extremities:  No clubbing, cyanosis or edema. Neuro:   Grossly intact.  Diagnostics/Labs  Spirometry: FEV1: 2.03L 93%, FVC: 2.56L 91%, ratio consistent with nonobstructive pattern  Assessment and plan: Allergic Rhinitis Food allergy  Shortness of breath - resolved at this time  - Continue avoidance measures for grass pollen, weed, tree pollen, minor indoor/outdoor molds, dust mites  - Use nasal saline rinses or your nasal saline spray before nose sprays such as with  Neilmed Sinus Rinse.  Use distilled water.   - Use Ryaltris  2 sprays each nostril twice daily for runny or stuffy nose.  With using nasal sprays point tip of bottle toward eye on same side nostril and lean head slightly forward for best technique.   - Use Xyzal 5 mg daily.  - Have access to albuterol  inhaler 2 puffs every 4-6 hours as needed for cough/wheeze/shortness of breath/chest tightness.  May use 15-20 minutes prior to activity.   Monitor frequency of use.   - Would use your albuterol  the day of your surgery (ok this with your anesthesia provider) - Lung function test today is normal! - Continue avoidance of shellfish in diet - Have access to self-injectable epinephrine  (Epipen  0.3mg ) OR nasal epinephrine  (Neffy) at all times.   Discussed Neffy today as an option to have for allergic reactions and will send this prescription to the specialty pharmacy who will contact you regarding coverage.  If not covered then let me know so I can refill  your Epipen .  - follow emergency action plan in case of allergic reaction  Follow-up in 1 year or sooner if needed  I appreciate the opportunity to take part in Nicole Rasmussen's care. Please do not hesitate to contact me with questions.  Sincerely,   Catha Clink, MD Allergy /Immunology [facility]

## 2023-10-04 NOTE — Patient Instructions (Addendum)
 Allergic Rhinitis Food allergy  Shortness of breath - resolved at this time  - Continue avoidance measures for grass pollen, weed, tree pollen, minor indoor/outdoor molds, dust mites  - Use nasal saline rinses or your nasal saline spray before nose sprays such as with Neilmed Sinus Rinse.  Use distilled water.   - Use Ryaltris  2 sprays each nostril twice daily for runny or stuffy nose.  With using nasal sprays point tip of bottle toward eye on same side nostril and lean head slightly forward for best technique.   - Use Xyzal 5 mg daily.  - Have access to albuterol  inhaler 2 puffs every 4-6 hours as needed for cough/wheeze/shortness of breath/chest tightness.  May use 15-20 minutes prior to activity.   Monitor frequency of use.   - Would use your albuterol  the day of your surgery (ok this with your anesthesia provider) - Continue avoidance of shellfish in diet - Have access to self-injectable epinephrine  (Epipen  0.3mg ) OR nasal epinephrine  (Neffy) at all times.   Discussed Neffy today as an option to have for allergic reactions and will send this prescription to the specialty pharmacy who will contact you regarding coverage.  If not covered then let me know so I can refill your Epipen .  - follow emergency action plan in case of allergic reaction  Follow-up in 1 year or sooner if needed

## 2023-10-05 ENCOUNTER — Telehealth: Payer: Self-pay | Admitting: *Deleted

## 2023-10-05 NOTE — Telephone Encounter (Signed)
 Spoke with Nicole Rasmussen who called the office stating she is experiencing pain on urination (pt is not describing the pain as burning) states this started last night and she is also experiencing blood in her urine. Pt describes the pain as sharp and it travels to her naval. Pt is also experiencing back pain on & off that started yesterday as well. Reports seeing her PCP this past Monday and they did a u/a and it was negative. Pt denies fever and or chills. Advised patient that her message will be relayed to provider and the office would call back with recommendations.

## 2023-10-05 NOTE — Telephone Encounter (Signed)
 Spoke with patient who called the office back stating her appt,. At PCP states she has a UTI and they prescribed antibiotic. Pt is following up on Monday with her PCP. Pt also has her pre-admission appt. Tomorrow with Maryan Smalling. Reminded patient that the office will call on Monday for her pre-op call and her message will be relayed to providers.

## 2023-10-05 NOTE — Progress Notes (Addendum)
 COVID Vaccine received:  []  No [x]  Yes Date of any COVID positive Test in last 90 days: no PCP - Bertrum Brodie MD Cardiologist - n/a  Chest x-ray - 05/04/23 Epic EKG -  05/08/23 Epic Stress Test - 08/27/20 Epic ECHO - 03/09/23 Epic Cardiac Cath -   Bowel Prep - [x]  No  []   Yes ______  Pacemaker / ICD device [x]  No []  Yes   Spinal Cord Stimulator:[x]  No []  Yes       History of Sleep Apnea? [x]  No []  Yes   CPAP used?- [x]  No []  Yes    Does the patient monitor blood sugar?          [x]  No []  Yes  []  N/A  Patient has: []  NO Hx DM   [x]  Pre-DM                 []  DM1  []   DM2 Does patient have a [employer] or Dexacom? []  No []  Yes   Fasting Blood Sugar Ranges-  Checks Blood Sugar _____ times a day  GLP1 agonist / usual dose - no GLP1 instructions:  SGLT-2 inhibitors / usual dose - no SGLT-2 instructions:   Blood Thinner / Instructions:no Aspirin Instructions:81mg  ASA- last took 09/28/23  Comments:   Activity level: Patient is able   to climb a flight of stairs without difficulty; [x]  No CP  [x]  No SOB, but would have ___    / can  perform ADLs without assistance.   Anesthesia review: Diagnosed w/ UTI 10/05/23. Started on antibiotic.  Patient denies shortness of breath, fever, cough and chest pain at PAT appointment.  Patient verbalized understanding and agreement to the Pre-Surgical Instructions that were given to them at this PAT appointment. Patient was also educated of the need to review these PAT instructions again prior to his/her surgery.I reviewed the appropriate phone numbers to call if they have any and questions or concerns.

## 2023-10-05 NOTE — Telephone Encounter (Addendum)
 Spoke with Nicole Rasmussen who called the office stating she has an appt. With her PCP to have a urine test this afternoon and she will let us  know when she has the results. Message forward to providers.

## 2023-10-05 NOTE — Patient Instructions (Signed)
 SURGICAL WAITING ROOM VISITATION  Patients having surgery or a procedure may have no more than 2 support people in the waiting area - these visitors may rotate.    Children under the age of 39 must have an adult with them who is not the patient.  Visitors with respiratory illnesses are discouraged from visiting and should remain at home.  If the patient needs to stay at the hospital during part of their recovery, the visitor guidelines for inpatient rooms apply. Pre-op nurse will coordinate an appropriate time for 1 support person to accompany patient in pre-op.  This support person may not rotate.    Please refer to the [facility] website for the visitor guidelines for Inpatients (after your surgery is over and you are in a regular room).       Your procedure is scheduled on: 10/10/23   Report to [facility] Main Entrance    Report to admitting at 8:15 AM   Call this number if you have problems the morning of surgery [phone]   Do not eat food :After Midnight.   After Midnight you may have the following liquids until 7:30 AM DAY OF SURGERY  Water Non-Citrus Juices (without pulp, NO RED-Apple, White grape, White cranberry) Black Coffee (NO MILK/CREAM OR CREAMERS, sugar ok)  Clear Tea (NO MILK/CREAM OR CREAMERS, sugar ok) regular and decaf                             Plain Jell-O (NO RED)                                           Fruit ices (not with fruit pulp, NO RED)                                     Popsicles (NO RED)                                                               Sports drinks like Gatorade (NO RED)                 FOLLOW BOWEL PREP AND ANY ADDITIONAL PRE OP INSTRUCTIONS YOU RECEIVED FROM YOUR SURGEON'S OFFICE!!!     Oral Hygiene is also important to reduce your risk of infection.                                    Remember - BRUSH YOUR TEETH THE MORNING OF SURGERY WITH YOUR REGULAR TOOTHPASTE  DENTURES WILL BE REMOVED PRIOR TO SURGERY  PLEASE DO NOT APPLY "Poly grip" OR ADHESIVES!!!   Stop all vitamins and herbal supplements 7 days before surgery.   Take these medicines the morning of surgery with A SIP OF WATER: atorvastatin,cetirizine,gabapentin ,Xyzal, tylenol  if needed, inhaler, nasal spray             You may not have any metal on your body including hair pins, jewelry, and body piercing  Do not wear make-up, lotions, powders, perfumes/cologne, or deodorant  Do not wear nail polish including gel and S&S, artificial/acrylic nails, or any other type of covering on natural nails including finger and toenails. If you have artificial nails, gel coating, etc. that needs to be removed by a nail salon please have this removed prior to surgery or surgery may need to be canceled/ delayed if the surgeon/ anesthesia feels like they are unable to be safely monitored.   Do not shave  48 hours prior to surgery.               Men may shave face and neck.   Do not bring valuables to the hospital. [facility] IS NOT             RESPONSIBLE   FOR VALUABLES.   Contacts, glasses, dentures or bridgework may not be worn into surgery.  DO NOT BRING YOUR HOME MEDICATIONS TO THE HOSPITAL. PHARMACY WILL DISPENSE MEDICATIONS LISTED ON YOUR MEDICATION LIST TO YOU DURING YOUR ADMISSION IN THE HOSPITAL!    Patients discharged on the day of surgery will not be allowed to drive home.  Someone NEEDS to stay with you for the first 24 hours after anesthesia.   Special Instructions: Bring a copy of your healthcare power of attorney and living will documents the day of surgery if you haven't scanned them before.              Please read over the following fact sheets you were given: IF YOU HAVE QUESTIONS ABOUT YOUR PRE-OP INSTRUCTIONS PLEASE CALL [phone] Nicole Rasmussen   If you received a COVID test during your pre-op visit  it is requested that you wear a mask when out in public, stay away from anyone that may not be feeling well and notify  your surgeon if you develop symptoms. If you test positive for Covid or have been in contact with anyone that has tested positive in the last 10 days please notify you surgeon.    [place] - Preparing for Surgery Before surgery, you can play an important role.  Because skin is not sterile, your skin needs to be as free of germs as possible.  You can reduce the number of germs on your skin by washing with CHG (chlorahexidine gluconate) soap before surgery.  CHG is an antiseptic cleaner which kills germs and bonds with the skin to continue killing germs even after washing. Please DO NOT use if you have an allergy  to CHG or antibacterial soaps.  If your skin becomes reddened/irritated stop using the CHG and inform your nurse when you arrive at Short Stay. Do not shave (including legs and underarms) for at least 48 hours prior to the first CHG shower.  You may shave your face/neck.  Please follow these instructions carefully:  1.  Shower with CHG Soap the night before surgery and the  morning of surgery.  2.  If you choose to wash your hair, wash your hair first as usual with your normal  shampoo.  3.  After you shampoo, rinse your hair and body thoroughly to remove the shampoo.                             4.  Use CHG as you would any other liquid soap.  You can apply chg directly to the skin and wash.  Gently with a scrungie or clean washcloth.  5.  Apply the CHG  Soap to your body ONLY FROM THE NECK DOWN.   Do   not use on face/ open                           Wound or open sores. Avoid contact with eyes, ears mouth and   genitals (private parts).                       Wash face,  Genitals (private parts) with your normal soap.             6.  Wash thoroughly, paying special attention to the area where your    surgery  will be performed.  7.  Thoroughly rinse your body with warm water from the neck down.  8.  DO NOT shower/wash with your normal soap after using and rinsing off the CHG Soap.                 9.  Pat yourself dry with a clean towel.            10.  Wear clean pajamas.            11.  Place clean sheets on your bed the night of your first shower and do not  sleep with pets. Day of Surgery : Do not apply any lotions/deodorants the morning of surgery.  Please wear clean clothes to the hospital/surgery center.  FAILURE TO FOLLOW THESE INSTRUCTIONS MAY RESULT IN THE CANCELLATION OF YOUR SURGERY   ________________________________________________________________________ WHAT IS A BLOOD TRANSFUSION? Blood Transfusion Information  A transfusion is the replacement of blood or some of its parts. Blood is made up of multiple cells which provide different functions. Red blood cells carry oxygen and are used for blood loss replacement. White blood cells fight against infection. Platelets control bleeding. Plasma helps clot blood. Other blood products are available for specialized needs, such as hemophilia or other clotting disorders. BEFORE THE TRANSFUSION  Who gives blood for transfusions?  Healthy volunteers who are fully evaluated to make sure their blood is safe. This is blood bank blood. Transfusion therapy is the safest it has ever been in the practice of medicine. Before blood is taken from a donor, a complete history is taken to make sure that person has no history of diseases nor engages in risky social behavior (examples are intravenous drug use or sexual activity with multiple partners). The donor's travel history is screened to minimize risk of transmitting infections, such as malaria. The donated blood is tested for signs of infectious diseases, such as HIV and hepatitis. The blood is then tested to be sure it is compatible with you in order to minimize the chance of a transfusion reaction. If you or a relative donates blood, this is often done in anticipation of surgery and is not appropriate for emergency situations. It takes many days to process the donated blood. RISKS AND  COMPLICATIONS Although transfusion therapy is very safe and saves many lives, the main dangers of transfusion include:  Getting an infectious disease. Developing a transfusion reaction. This is an allergic reaction to something in the blood you were given. Every precaution is taken to prevent this. The decision to have a blood transfusion has been considered carefully by your caregiver before blood is given. Blood is not given unless the benefits outweigh the risks. AFTER THE TRANSFUSION Right after receiving a blood transfusion, you will usually feel much better and more  energetic. This is especially true if your red blood cells have gotten low (anemic). The transfusion raises the level of the red blood cells which carry oxygen, and this usually causes an energy increase. The nurse administering the transfusion will monitor you carefully for complications. HOME CARE INSTRUCTIONS  No special instructions are needed after a transfusion. You may find your energy is better. Speak with your caregiver about any limitations on activity for underlying diseases you may have. SEEK MEDICAL CARE IF:  Your condition is not improving after your transfusion. You develop redness or irritation at the intravenous (IV) site. SEEK IMMEDIATE MEDICAL CARE IF:  Any of the following symptoms occur over the next 12 hours: Shaking chills. You have a temperature by mouth above 102 F (38.9 C), not controlled by medicine. Chest, back, or muscle pain. People around you feel you are not acting correctly or are confused. Shortness of breath or difficulty breathing. Dizziness and fainting. You get a rash or develop hives. You have a decrease in urine output. Your urine turns a dark color or changes to pink, red, or brown. Any of the following symptoms occur over the next 10 days: You have a temperature by mouth above 102 F (38.9 C), not controlled by medicine. Shortness of breath. Weakness after normal activity. The  white part of the eye turns yellow (jaundice). You have a decrease in the amount of urine or are urinating less often. Your urine turns a dark color or changes to pink, red, or brown. Document Released: 04/15/2000 Document Revised: 07/11/2011 Document Reviewed: 12/03/2007 [facility] Patient Information 2014 [place], [place].

## 2023-10-06 ENCOUNTER — Encounter (HOSPITAL_COMMUNITY): Payer: Self-pay

## 2023-10-06 ENCOUNTER — Encounter (HOSPITAL_COMMUNITY)
Admission: RE | Admit: 2023-10-06 | Discharge: 2023-10-06 | Disposition: A | Discharge status: Home / Self Care | Source: Ambulatory Visit | Attending: Psychiatry | Admitting: Psychiatry

## 2023-10-06 ENCOUNTER — Other Ambulatory Visit: Payer: Self-pay

## 2023-10-06 DIAGNOSIS — Z01812 Encounter for preprocedural laboratory examination: Secondary | ICD-10-CM | POA: Insufficient documentation

## 2023-10-06 DIAGNOSIS — N8502 Endometrial intraepithelial neoplasia [EIN]: Secondary | ICD-10-CM | POA: Insufficient documentation

## 2023-10-06 DIAGNOSIS — Z01818 Encounter for other preprocedural examination: Secondary | ICD-10-CM | POA: Diagnosis present

## 2023-10-06 HISTORY — DX: Malignant (primary) neoplasm, unspecified: C80.1

## 2023-10-06 LAB — COMPREHENSIVE METABOLIC PANEL WITH GFR
ALT: 21 U/L (ref 0–44)
AST: 23 U/L (ref 15–41)
Albumin: 3.7 g/dL (ref 3.5–5.0)
Alkaline Phosphatase: 73 U/L (ref 38–126)
Anion gap: 9 (ref 5–15)
BUN: 13 mg/dL (ref 8–23)
CO2: 26 mmol/L (ref 22–32)
Calcium: 9.1 mg/dL (ref 8.9–10.3)
Chloride: 106 mmol/L (ref 98–111)
Creatinine, Ser: 0.64 mg/dL (ref 0.44–1.00)
GFR, Estimated: >60 mL/min (ref >60)
Glucose, Bld: 102 mg/dL — ABNORMAL HIGH (ref 70–99)
Potassium: 3.6 mmol/L (ref 3.5–5.1)
Sodium: 141 mmol/L (ref 135–145)
Total Bilirubin: 1 mg/dL (ref 0.0–1.2)
Total Protein: 7.1 g/dL (ref 6.5–8.1)

## 2023-10-06 LAB — CBC
HCT: 39.4 % (ref 36.0–46.0)
Hemoglobin: 12.6 g/dL (ref 12.0–15.0)
MCH: 28.2 pg (ref 26.0–34.0)
MCHC: 32 g/dL (ref 30.0–36.0)
MCV: 88.1 fL (ref 80.0–100.0)
Platelets: 157 10*3/uL (ref 150–400)
RBC: 4.47 MIL/uL (ref 3.87–5.11)
RDW: 13.4 % (ref 11.5–15.5)
WBC: 9.4 10*3/uL (ref 4.0–10.5)
nRBC: 0 % (ref 0.0–0.2)

## 2023-10-09 ENCOUNTER — Telehealth: Payer: Self-pay | Admitting: *Deleted

## 2023-10-09 NOTE — Discharge Instructions (Signed)
 AFTER SURGERY INSTRUCTIONS   Return to work: 4-6 weeks if applicable   Activity: 1. Be up and out of the bed during the day.  Take a nap if needed.  You may walk up steps but be careful and use the hand rail.  Stair climbing will tire you more than you think, you may need to stop part way and rest.    2. No lifting or straining for 6 weeks over 10 pounds. No pushing, pulling, straining for 6 weeks.   3. No driving for 1-61 days when the following criteria have been met: Do not drive if you are taking narcotic pain medicine and make sure that your reaction time has returned.    4. You can shower as soon as the next day after surgery. Shower daily.  Use your regular soap and water (not directly on the incision) and pat your incision(s) dry afterwards; don't rub.  No tub baths or submerging your body in water until cleared by your surgeon. If you have the soap that was given to you by pre-surgical testing that was used before surgery, you do not need to use it afterwards because this can irritate your incisions.    5. No sexual activity and nothing in the vagina for 12 weeks.   6. You may experience a small amount of clear drainage from your incisions, which is normal.  If the drainage persists, increases, or changes color please call the office.   7. Do not use creams, lotions, or ointments such as neosporin on your incisions after surgery until advised by your surgeon because they can cause removal of the dermabond glue on your incisions.     8. You may experience vaginal spotting after surgery or when the stitches at the top of the vagina begin to dissolve.  The spotting is normal but if you experience heavy bleeding, call our office.   9. Take Tylenol  or ibuprofen  (OR MOBIC (MELOXICAM), DO NOT TAKE TOGETHER SINCE THEY WORK SIMILARLY) first for pain if you are able to take these medications and only use narcotic pain medication for severe pain not relieved by the Tylenol  or Ibuprofen .  Monitor  your Tylenol  intake to a max of 4,000 mg in a 24 hour period. You can alternate these medications after surgery.  10.  Resume aspirin on postoperative day #2 or 48 hours after surgery.   Diet: 1. Low sodium Heart Healthy Diet is recommended but you are cleared to resume your normal (before surgery) diet after your procedure.   2. It is safe to use a laxative, such as Miralax or Colace, if you have difficulty moving your bowels before surgery. You have been prescribed Sennakot-S to take at bedtime every evening after surgery to keep bowel movements regular and to prevent constipation.     Wound Care: 1. Keep clean and dry.  Shower daily.   Reasons to call the Doctor: Fever - Oral temperature greater than 100.4 degrees Fahrenheit Foul-smelling vaginal discharge Difficulty urinating Nausea and vomiting Increased pain at the site of the incision that is unrelieved with pain medicine. Difficulty breathing with or without chest pain New calf pain especially if only on one side Sudden, continuing increased vaginal bleeding with or without clots.   Contacts: For questions or concerns you should contact:   Dr. Derrel Flies at [phone]   Vira Grieves, NP at [phone]   After Hours: call [phone] and have the GYN Oncologist paged/contacted (after 5 pm or on the weekends). You will  speak with an after hours RN and let he or she know you have had surgery.   Messages sent via mychart are for non-urgent matters and are not responded to after hours so for urgent needs, please call the after hours number.

## 2023-10-09 NOTE — Telephone Encounter (Signed)
 Telephone call to check on pre-operative status.  Patient compliant with pre-operative instructions.  Reinforced nothing to eat after midnight. Clear liquids until 0715. Patient to arrive at 0815.  Pt states she is still taking antibiotics for UTI. She will also be seeing her PCP today to follow up. No questions or concerns voiced.  Instructed to call for any needs.

## 2023-10-10 ENCOUNTER — Other Ambulatory Visit: Payer: Self-pay

## 2023-10-10 ENCOUNTER — Ambulatory Visit (HOSPITAL_COMMUNITY)
Admission: RE | Admit: 2023-10-10 | Discharge: 2023-10-10 | Disposition: A | Discharge status: Home / Self Care | Attending: Psychiatry | Admitting: Psychiatry

## 2023-10-10 ENCOUNTER — Encounter (HOSPITAL_COMMUNITY)
Admission: RE | Disposition: A | Discharge status: Home / Self Care | Payer: Self-pay | Source: Home / Self Care | Attending: Psychiatry

## 2023-10-10 ENCOUNTER — Ambulatory Visit (HOSPITAL_COMMUNITY): Admitting: Anesthesiology

## 2023-10-10 ENCOUNTER — Encounter (HOSPITAL_COMMUNITY): Payer: Self-pay | Admitting: Psychiatry

## 2023-10-10 ENCOUNTER — Ambulatory Visit (HOSPITAL_COMMUNITY): Admitting: Physician Assistant

## 2023-10-10 DIAGNOSIS — K219 Gastro-esophageal reflux disease without esophagitis: Secondary | ICD-10-CM | POA: Insufficient documentation

## 2023-10-10 DIAGNOSIS — C541 Malignant neoplasm of endometrium: Secondary | ICD-10-CM | POA: Diagnosis not present

## 2023-10-10 DIAGNOSIS — N8502 Endometrial intraepithelial neoplasia [EIN]: Secondary | ICD-10-CM | POA: Diagnosis not present

## 2023-10-10 DIAGNOSIS — I10 Essential (primary) hypertension: Secondary | ICD-10-CM | POA: Insufficient documentation

## 2023-10-10 DIAGNOSIS — Z79899 Other long term (current) drug therapy: Secondary | ICD-10-CM | POA: Insufficient documentation

## 2023-10-10 DIAGNOSIS — J45909 Unspecified asthma, uncomplicated: Secondary | ICD-10-CM | POA: Insufficient documentation

## 2023-10-10 DIAGNOSIS — Z87891 Personal history of nicotine dependence: Secondary | ICD-10-CM | POA: Insufficient documentation

## 2023-10-10 DIAGNOSIS — N8003 Adenomyosis of the uterus: Secondary | ICD-10-CM | POA: Insufficient documentation

## 2023-10-10 HISTORY — PX: LYMPH NODE BIOPSY: SHX201

## 2023-10-10 HISTORY — DX: Family history of other specified conditions: Z84.89

## 2023-10-10 HISTORY — PX: INJECTION, FOR SENTINEL LYMPH NODE IDENTIFICATION: SHX7598

## 2023-10-10 HISTORY — PX: ROBOTIC ASSISTED TOTAL HYSTERECTOMY WITH BILATERAL SALPINGO OOPHERECTOMY: SHX6086

## 2023-10-10 LAB — TYPE AND SCREEN
ABO/RH(D): A POS
Antibody Screen: NEGATIVE

## 2023-10-10 SURGERY — HYSTERECTOMY, TOTAL, ROBOT-ASSISTED, LAPAROSCOPIC, WITH BILATERAL SALPINGO-OOPHORECTOMY
Anesthesia: General | Site: Abdomen

## 2023-10-10 MED ORDER — STERILE WATER FOR IRRIGATION IR SOLN
Status: DC | PRN
  Administered 2023-10-10: 1000 mL

## 2023-10-10 MED ORDER — BUPIVACAINE HCL 0.25 % IJ SOLN
INTRAMUSCULAR | Status: DC | PRN
  Administered 2023-10-10: 30 mL

## 2023-10-10 MED ORDER — STERILE WATER FOR INJECTION IJ SOLN
INTRAMUSCULAR | Status: AC
  Filled 2023-10-10: qty 10

## 2023-10-10 MED ORDER — LIDOCAINE HCL (CARDIAC) PF 100 MG/5ML IV SOSY
PREFILLED_SYRINGE | INTRAVENOUS | Status: DC | PRN
  Administered 2023-10-10: 100 mg via INTRAVENOUS

## 2023-10-10 MED ORDER — PHENYLEPHRINE 80 MCG/ML (10ML) SYRINGE FOR IV PUSH (FOR BLOOD PRESSURE SUPPORT)
PREFILLED_SYRINGE | INTRAVENOUS | Status: DC | PRN
Start: 2023-10-10 — End: 2023-10-10
  Administered 2023-10-10 (×3): 80 ug via INTRAVENOUS

## 2023-10-10 MED ORDER — ONDANSETRON HCL 4 MG/2ML IJ SOLN
INTRAMUSCULAR | Status: AC
  Filled 2023-10-10: qty 2

## 2023-10-10 MED ORDER — METRONIDAZOLE 500 MG/100ML IV SOLN
500.0000 mg | INTRAVENOUS | Status: AC
  Administered 2023-10-10: 500 mg via INTRAVENOUS
  Filled 2023-10-10: qty 100

## 2023-10-10 MED ORDER — FENTANYL CITRATE (PF) 100 MCG/2ML IJ SOLN
INTRAMUSCULAR | Status: DC | PRN
  Administered 2023-10-10 (×2): 100 ug via INTRAVENOUS

## 2023-10-10 MED ORDER — LACTATED RINGERS IR SOLN
Status: DC | PRN
  Administered 2023-10-10: 1000 mL

## 2023-10-10 MED ORDER — CEFAZOLIN SODIUM-DEXTROSE 2-4 GM/100ML-% IV SOLN
2.0000 g | INTRAVENOUS | Status: AC
Start: 2023-10-10 — End: 2023-10-10
  Administered 2023-10-10: 2 g via INTRAVENOUS
  Filled 2023-10-10: qty 100

## 2023-10-10 MED ORDER — ROCURONIUM BROMIDE 10 MG/ML (PF) SYRINGE
PREFILLED_SYRINGE | INTRAVENOUS | Status: AC
Start: 2023-10-10 — End: ?
  Filled 2023-10-10: qty 10

## 2023-10-10 MED ORDER — MIDAZOLAM HCL 5 MG/5ML IJ SOLN
INTRAMUSCULAR | Status: DC | PRN
  Administered 2023-10-10 (×2): 1 mg via INTRAVENOUS

## 2023-10-10 MED ORDER — ACETAMINOPHEN 500 MG PO TABS
1000.0000 mg | ORAL_TABLET | ORAL | Status: AC
  Administered 2023-10-10: 1000 mg via ORAL
  Filled 2023-10-10: qty 2

## 2023-10-10 MED ORDER — ONDANSETRON HCL 4 MG/2ML IJ SOLN
INTRAMUSCULAR | Status: DC | PRN
Start: 2023-10-10 — End: 2023-10-10
  Administered 2023-10-10: 4 mg via INTRAVENOUS

## 2023-10-10 MED ORDER — OXYCODONE HCL 5 MG PO TABS
ORAL_TABLET | ORAL | Status: AC
  Filled 2023-10-10: qty 1

## 2023-10-10 MED ORDER — DEXAMETHASONE SODIUM PHOSPHATE 10 MG/ML IJ SOLN
4.0000 mg | INTRAMUSCULAR | Status: DC
Start: 2023-10-10 — End: 2023-10-10

## 2023-10-10 MED ORDER — STERILE WATER FOR INJECTION IJ SOLN
INTRAMUSCULAR | Status: DC | PRN
  Administered 2023-10-10: 10 mL

## 2023-10-10 MED ORDER — LACTATED RINGERS IV SOLN: INTRAVENOUS | Status: DC

## 2023-10-10 MED ORDER — ORAL CARE MOUTH RINSE: 15.0000 mL | Freq: Once | OROMUCOSAL | Status: AC

## 2023-10-10 MED ORDER — LIDOCAINE HCL (PF) 2 % IJ SOLN
INTRAMUSCULAR | Status: AC
  Filled 2023-10-10: qty 5

## 2023-10-10 MED ORDER — SUGAMMADEX SODIUM 200 MG/2ML IV SOLN
INTRAVENOUS | Status: DC | PRN
Start: 2023-10-10 — End: 2023-10-10
  Administered 2023-10-10: 200 mg via INTRAVENOUS

## 2023-10-10 MED ORDER — PROPOFOL 10 MG/ML IV BOLUS
INTRAVENOUS | Status: AC
  Filled 2023-10-10: qty 20

## 2023-10-10 MED ORDER — MIDAZOLAM HCL 2 MG/2ML IJ SOLN
INTRAMUSCULAR | Status: AC
  Filled 2023-10-10: qty 2

## 2023-10-10 MED ORDER — HEPARIN SODIUM (PORCINE) 5000 UNIT/ML IJ SOLN
5000.0000 [IU] | INTRAMUSCULAR | Status: AC
  Administered 2023-10-10: 5000 [IU] via SUBCUTANEOUS
  Filled 2023-10-10: qty 1

## 2023-10-10 MED ORDER — PROPOFOL 10 MG/ML IV BOLUS
INTRAVENOUS | Status: DC | PRN
  Administered 2023-10-10: 170 mg via INTRAVENOUS

## 2023-10-10 MED ORDER — DEXAMETHASONE SODIUM PHOSPHATE 10 MG/ML IJ SOLN
INTRAMUSCULAR | Status: DC | PRN
Start: 2023-10-10 — End: 2023-10-10
  Administered 2023-10-10: 5 mg via INTRAVENOUS

## 2023-10-10 MED ORDER — DEXAMETHASONE SODIUM PHOSPHATE 10 MG/ML IJ SOLN
INTRAMUSCULAR | Status: AC
Start: 2023-10-10 — End: ?
  Filled 2023-10-10: qty 1

## 2023-10-10 MED ORDER — DIPHENHYDRAMINE HCL 50 MG/ML IJ SOLN
INTRAMUSCULAR | Status: AC
  Filled 2023-10-10: qty 1

## 2023-10-10 MED ORDER — FENTANYL CITRATE (PF) 250 MCG/5ML IJ SOLN
INTRAMUSCULAR | Status: AC
  Filled 2023-10-10: qty 5

## 2023-10-10 MED ORDER — DROPERIDOL 2.5 MG/ML IJ SOLN: 0.6250 mg | Freq: Once | INTRAMUSCULAR | Status: DC | PRN

## 2023-10-10 MED ORDER — ACETAMINOPHEN 500 MG PO TABS: 1000.0000 mg | ORAL_TABLET | Freq: Once | ORAL | Status: DC

## 2023-10-10 MED ORDER — ROCURONIUM BROMIDE 100 MG/10ML IV SOLN
INTRAVENOUS | Status: DC | PRN
  Administered 2023-10-10: 20 mg via INTRAVENOUS
  Administered 2023-10-10: 60 mg via INTRAVENOUS

## 2023-10-10 MED ORDER — CHLORHEXIDINE GLUCONATE 0.12 % MT SOLN
15.0000 mL | Freq: Once | OROMUCOSAL | Status: AC
  Administered 2023-10-10: 15 mL via OROMUCOSAL

## 2023-10-10 MED ORDER — BUPIVACAINE HCL (PF) 0.25 % IJ SOLN
INTRAMUSCULAR | Status: AC
Start: 2023-10-10 — End: ?
  Filled 2023-10-10: qty 30

## 2023-10-10 MED ORDER — HYDROMORPHONE HCL 1 MG/ML IJ SOLN
INTRAMUSCULAR | Status: AC
  Filled 2023-10-10: qty 1

## 2023-10-10 MED ORDER — OXYCODONE HCL 5 MG PO TABS
5.0000 mg | ORAL_TABLET | Freq: Once | ORAL | Status: AC
  Administered 2023-10-10: 5 mg via ORAL

## 2023-10-10 MED ORDER — DIPHENHYDRAMINE HCL 50 MG/ML IJ SOLN
INTRAMUSCULAR | Status: DC | PRN
Start: 2023-10-10 — End: 2023-10-10
  Administered 2023-10-10: 25 mg via INTRAVENOUS

## 2023-10-10 MED ORDER — HYDROMORPHONE HCL 1 MG/ML IJ SOLN
0.2500 mg | INTRAMUSCULAR | Status: DC | PRN
  Administered 2023-10-10: 0.5 mg via INTRAVENOUS
  Administered 2023-10-10: 0.25 mg via INTRAVENOUS

## 2023-10-10 SURGICAL SUPPLY — 66 items
APPLICATOR SURGIFLO ENDO (HEMOSTASIS) IMPLANT
BAG LAPAROSCOPIC 12 15 PORT 16 (BASKET) IMPLANT
BLADE SURG SZ10 CARB STEEL (BLADE) IMPLANT
COVER BACK TABLE 60X90IN (DRAPES) ×2 IMPLANT
COVER TIP SHEARS 8 DVNC (MISCELLANEOUS) ×2 IMPLANT
DERMABOND ADVANCED .7 DNX12 (GAUZE/BANDAGES/DRESSINGS) ×2 IMPLANT
DRAPE ARM DVNC X/XI (DISPOSABLE) ×8 IMPLANT
DRAPE COLUMN DVNC XI (DISPOSABLE) ×2 IMPLANT
DRAPE SHEET LG 3/4 BI-LAMINATE (DRAPES) ×2 IMPLANT
DRAPE SURG IRRIG POUCH 19X23 (DRAPES) ×2 IMPLANT
DRIVER NDL MEGA SUTCUT DVNCXI (INSTRUMENTS) ×2 IMPLANT
DRIVER NDLE MEGA SUTCUT DVNCXI (INSTRUMENTS) ×2 IMPLANT
DRSG OPSITE POSTOP 4X6 (GAUZE/BANDAGES/DRESSINGS) IMPLANT
DRSG OPSITE POSTOP 4X8 (GAUZE/BANDAGES/DRESSINGS) IMPLANT
DRSG TEGADERM 6X8 (GAUZE/BANDAGES/DRESSINGS) IMPLANT
ELECT PENCIL ROCKER SW 15FT (MISCELLANEOUS) IMPLANT
ELECT REM PT RETURN 15FT ADLT (MISCELLANEOUS) ×2 IMPLANT
FORCEPS BPLR FENES DVNC XI (FORCEP) ×2 IMPLANT
FORCEPS PROGRASP DVNC XI (FORCEP) ×2 IMPLANT
GAUZE 4X4 16PLY ~~LOC~~+RFID DBL (SPONGE) ×2 IMPLANT
GLOVE BIO SURGEON STRL SZ 6.5 (GLOVE) ×2 IMPLANT
GLOVE BIOGEL PI IND STRL 6.5 (GLOVE) ×4 IMPLANT
GLOVE BIOGEL PI MICRO STRL 6 (GLOVE) ×8 IMPLANT
GOWN STRL REUS W/ TWL LRG LVL3 (GOWN DISPOSABLE) ×8 IMPLANT
GRASPER SUT TROCAR 14GX15 (MISCELLANEOUS) IMPLANT
HOLDER FOLEY CATH W/STRAP (MISCELLANEOUS) IMPLANT
IRRIGATION SUCT STRKRFLW 2 WTP (MISCELLANEOUS) ×2 IMPLANT
KIT PROCEDURE DVNC SI (MISCELLANEOUS) IMPLANT
KIT TURNOVER KIT A (KITS) IMPLANT
LIGASURE IMPACT 36 18CM CVD LR (INSTRUMENTS) IMPLANT
MANIPULATOR ADVINCU DEL 3.0 PL (MISCELLANEOUS) IMPLANT
MANIPULATOR ADVINCU DEL 3.5 PL (MISCELLANEOUS) IMPLANT
MANIPULATOR UTERINE 4.5 ZUMI (MISCELLANEOUS) IMPLANT
NDL HYPO 21X1.5 SAFETY (NEEDLE) ×2 IMPLANT
NDL INSUFFLATION 14GA 120MM (NEEDLE) IMPLANT
NDL SPNL 20GX3.5 QUINCKE YW (NEEDLE) IMPLANT
NEEDLE HYPO 21X1.5 SAFETY (NEEDLE) ×2 IMPLANT
NEEDLE INSUFFLATION 14GA 120MM (NEEDLE) IMPLANT
NEEDLE SPNL 20GX3.5 QUINCKE YW (NEEDLE) IMPLANT
OBTURATOR OPTICALSTD 8 DVNC (TROCAR) ×2 IMPLANT
PACK ROBOT GYN CUSTOM WL (TRAY / TRAY PROCEDURE) ×2 IMPLANT
PAD ARMBOARD POSITIONER FOAM (MISCELLANEOUS) ×2 IMPLANT
PAD POSITIONING PINK XL (MISCELLANEOUS) ×2 IMPLANT
PORT ACCESS TROCAR AIRSEAL 12 (TROCAR) IMPLANT
SCISSORS MNPLR CVD DVNC XI (INSTRUMENTS) ×2 IMPLANT
SCRUB CHG 4% DYNA-HEX 4OZ (MISCELLANEOUS) ×4 IMPLANT
SEAL UNIV 5-12 XI (MISCELLANEOUS) ×8 IMPLANT
SET TRI-LUMEN FLTR TB AIRSEAL (TUBING) ×2 IMPLANT
SPIKE FLUID TRANSFER (MISCELLANEOUS) ×2 IMPLANT
SPONGE T-LAP 18X18 ~~LOC~~+RFID (SPONGE) IMPLANT
SURGIFLO W/THROMBIN 8M KIT (HEMOSTASIS) IMPLANT
SUT MNCRL AB 4-0 PS2 18 (SUTURE) IMPLANT
SUT PDS AB 1 TP1 54 (SUTURE) IMPLANT
SUT VIC AB 0 CT1 27XBRD ANTBC (SUTURE) IMPLANT
SUT VIC AB 2-0 CT1 TAPERPNT 27 (SUTURE) IMPLANT
SUT VIC AB 4-0 PS2 18 (SUTURE) ×4 IMPLANT
SUT VICRYL 0 27 CT2 27 ABS (SUTURE) ×2 IMPLANT
SYR 10ML LL (SYRINGE) IMPLANT
SYSTEM BAG RETRIEVAL 10MM (BASKET) IMPLANT
SYSTEM WOUND ALEXIS 18CM MED (MISCELLANEOUS) IMPLANT
TRAP SPECIMEN MUCUS 40CC (MISCELLANEOUS) IMPLANT
TRAY FOLEY MTR SLVR 16FR STAT (SET/KITS/TRAYS/PACK) ×2 IMPLANT
TROCAR PORT AIRSEAL 5X120 (TROCAR) IMPLANT
UNDERPAD 30X36 HEAVY ABSORB (UNDERPADS AND DIAPERS) ×4 IMPLANT
WATER STERILE IRR 1000ML POUR (IV SOLUTION) ×2 IMPLANT
YANKAUER SUCT BULB TIP 10FT TU (MISCELLANEOUS) IMPLANT

## 2023-10-10 NOTE — Interval H&P Note (Signed)
 History and Physical Interval Note:  10/10/2023 9:27 AM  Nicole Rasmussen  has presented today for surgery, with the diagnosis of Endometrial intraepithelial neoplasia.  The various methods of treatment have been discussed with the patient and family. After consideration of risks, benefits and other options for treatment, the patient has consented to  Procedure(s): HYSTERECTOMY, TOTAL, ROBOT-ASSISTED, LAPAROSCOPIC, WITH BILATERAL SALPINGO-OOPHORECTOMY (N/A) INJECTION, FOR SENTINEL LYMPH NODE IDENTIFICATION (N/A) LYMPH NODE BIOPSY (N/A) LYMPHADENECTOMY, PELVIS, ROBOT-ASSISTED (N/A) as a surgical intervention.  The patient's history has been reviewed, patient examined, no change in status, stable for surgery.  I have reviewed the patient's chart and labs.  Questions were answered to the patient's satisfaction.     [name]

## 2023-10-10 NOTE — Op Note (Signed)
 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  Postoperative Diagnosis:   Procedures: Robotic-assisted total laparoscopic hysterectomy, bilateral salpingo-oophorectomy, bilateral sentinel lymph node evaluation and biopsy,   Surgeon: Derrel Flies, MD  Assistants: Abdul Hodgkin, MD and (an MD assistant was necessary for tissue manipulation, management of robotic instrumentation, retraction and positioning due to the complexity of the case and hospital policies)  Anesthesia: General  Estimated Blood Loss: 25 mL    Fluids: 1000 ml, crystalloid  Urine Output: 300 ml, clear yellow  Findings: Normal upper abdominal survey with normal liver surface and diaphragm. Normal appearing small and large bowel. Normal uterus, tubes, and ovaries. Adhesions of bladder to the anterior lower uterine segment and cervix with hypervascularity. No evidence of peritoneal disease, ascites, or carcinomatosis. Sentinel mapping on right to the obturator space; sentinel mapping on left to the obturator space.   Specimens:  ID Type Source Tests Collected by Time Destination  1 : RIGHT OBTURATOR SENTINEL LYMPH NODE Tissue Path Tissue SURGICAL PATHOLOGY Derrel Flies, MD 10/10/2023 1229   2 : LEFT OBTURATOR SENTINEL LYMPH NODE Tissue Path Tissue SURGICAL PATHOLOGY Derrel Flies, MD 10/10/2023 1248   3 : UTERUS,CERVIX,BILATERAL TUBES AND OVARIES Tissue Path Tissue SURGICAL PATHOLOGY Derrel Flies, MD 10/10/2023 1321     Complications:  None  Indications for Procedure: Nicole Rasmussen is a 63 y.o. 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  Prior to the procedure, all risks, benefits, and alternatives were discussed and informed surgical consent was signed.  Procedure: Patient was taken to the operating  room where general anesthesia was achieved.  She was positioned in dorsal lithotomy and prepped and draped.  A foley catheter was inserted into the bladder. 1 ml of dilute Indo-Cyanine dye was was injected at 1cm and 1mm deep at 3 and 9 o'clock in the cervical stroma.  The cervix was dilated and an Advincula uterine manipulator with a colpotomy ring was inserted into the uterus.  A 12 mm incision was made in the left upper quadrant near Palmer's point.  The abdomen was entered with a 5 mm OptiView trocar under direct visualization.  The abdomen was insufflated, the patient placed in steep Trendelenburg, and additional trocars were placed as follows: an 8mm trocar superior to the umbilicus, two 8 mm robotic trocars in the right abdomen, and one 8 mm robotic trocar in the left abdomen.  The left upper quadrant trocar was removed and replaced with a 12 mm airseal trocar.  All trocars were placed under direct visualization.  The bowels were moved into the upper abdomen.  The DaVinci robotic surgical system was brought to the patient's bedside and docked.  The right round ligament was transected and the retroperitoneum entered.The right ureter was identified. The paravesical and pararectal spaces were opened, and the node was found to be located obturator space. A sentinel lymph node dissection was performed taking care to avoid injury to the ureter, superior vesicle artery or obturator nerve. Lymph node may have been closely associated with adjacent lymph node, both removed together. A similar procedure was performed on left with the sentinel lymph node also identified in the obturator space. The sentinel lymph nodes mentioned above were identified and removed through the assistant trocar.  The left ureter was again identified, and the left infundibulopelvic ligament was isolated, cauterized, and transected. The posterior peritoneum was opened to the colpotomy ring.  The anterior peritoneum was opened and the  bladder flap was initiated. Adhesions of the bladder to the anterior lower uterine segment and cervix were lysed sharply. Increased vascularity in this area was also encountered during this dissection. The left uterine artery was skeletonized, cauterized, and transected at the level of the colpotomy ring. Additional cautery was used in a C-shaped fashion to allow the remainder of the broad, cardinal, and uterosacral ligaments with the uterine vessels to be transected and fall away from the colpotomy ring.  A similar procedure was performed on the contralateral side. A colpotomy was made circumferentially following the contours of the colpotomy ring.  The uterine specimen was removed through the vagina.    The vaginal cuff was closed with a running stitch of 0 Vicryl suture.  The pelvis was irrigated and all operative sites were found to be hemostatic.  All instruments were removed and the robot was taken from the patient's bedside. The fascia at the 12 mm incision was closed with 0 Vicryl using a PMI device. The abdomen was desufflated and all ports were removed. The skin at all incisions was closed with 4-0 Vicryl to reapproximate the subcutaneous tissue and 4-0 monocryl in a subcuticular fashion followed by surgical glue.  Patient tolerated the procedure well. Sponge, lap, and instrument counts were correct.  Patient received 2 gm of Ancef and 500mg  metronidazole prior to skin incision for routine perioperative antibiotic prophylaxis.  She was extubated and taken to the PACU in stable condition.  Derrel Flies, MD Gynecologic Oncology

## 2023-10-10 NOTE — Transfer of Care (Signed)
 Immediate Anesthesia Transfer of Care Note  Patient: Nicole Rasmussen  Procedure(s) Performed: ROBOTIC-ASSISTED TOTAL LAPAROSCOPIC HYSTERECTOMY WITH BILATERAL SALPINGO-OOPHORECTOMY (Bilateral: Abdomen) INJECTION, FOR SENTINEL LYMPH NODE IDENTIFICATION (Bilateral: Abdomen) BILATERAL SENINTELY LYMPH NODE BIOPSY (Bilateral: Abdomen)  Patient Location: PACU  Anesthesia Type:General  Level of Consciousness: awake, alert , oriented, and patient cooperative  Airway & Oxygen Therapy: Patient Spontanous Breathing and Patient connected to face mask oxygen  Post-op Assessment: Report given to RN and Post -op Vital signs reviewed and stable  Post vital signs: Reviewed and stable  Last Vitals:  Vitals Value Taken Time  BP 141/96 10/10/23 1415  Temp    Pulse 71 10/10/23 1416  Resp 11 10/10/23 1416  SpO2 100 % 10/10/23 1416  Vitals shown include unfiled device data.  Last Pain:  Vitals:   10/10/23 1021  TempSrc:   PainSc: 0-No pain         Complications: No notable events documented.

## 2023-10-10 NOTE — Anesthesia Postprocedure Evaluation (Signed)
 Anesthesia Post Note  Patient: Nicole Rasmussen  Procedure(s) Performed: ROBOTIC-ASSISTED TOTAL LAPAROSCOPIC HYSTERECTOMY WITH BILATERAL SALPINGO-OOPHORECTOMY (Bilateral: Abdomen) INJECTION, FOR SENTINEL LYMPH NODE IDENTIFICATION (Bilateral: Abdomen) BILATERAL SENINTELY LYMPH NODE BIOPSY (Bilateral: Abdomen)     Patient location during evaluation: PACU Anesthesia Type: General Level of consciousness: awake and alert Pain management: pain level controlled Vital Signs Assessment: post-procedure vital signs reviewed and stable Respiratory status: spontaneous breathing, nonlabored ventilation and respiratory function stable Cardiovascular status: blood pressure returned to baseline Postop Assessment: no apparent nausea or vomiting Anesthetic complications: no   No notable events documented.  Last Vitals:  Vitals:   10/10/23 1545 10/10/23 1600  BP: (!) 138/94 137/82  Pulse: (!) 54 62  Resp: 15 17  Temp:    SpO2: 100% 99%    Last Pain:  Vitals:   10/10/23 1600  TempSrc:   PainSc: 3                  Rayfield Cairo

## 2023-10-10 NOTE — Anesthesia Preprocedure Evaluation (Addendum)
 Anesthesia Evaluation  Patient identified by MRN, date of birth, ID band Patient awake    Reviewed: Allergy  & Precautions, NPO status , Patient's Chart, lab work & pertinent test results  History of Anesthesia Complications Negative for: history of anesthetic complications  Airway Mallampati: II  TM Distance: >3 FB Neck ROM: Full    Dental  (+) Missing,    Pulmonary asthma , former smoker   Pulmonary exam normal        Cardiovascular hypertension, Pt. on medications Normal cardiovascular exam     Neuro/Psych  Headaches    GI/Hepatic Neg liver ROS,GERD  Controlled,,  Endo/Other  negative endocrine ROS    Renal/GU negative Renal ROS     Musculoskeletal  (+) Arthritis ,    Abdominal   Peds  Hematology negative hematology ROS (+)   Anesthesia Other Findings Day of surgery medications reviewed with patient.  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                             Anesthesia Physical Anesthesia Plan  ASA: 2  Anesthesia Plan: General   Post-op Pain Management: Tylenol  PO (pre-op)*   Induction: Intravenous  PONV Risk Score and Plan: 3 and Treatment may vary due to age or medical condition, Ondansetron , Dexamethasone  and Midazolam   Airway Management Planned: Oral ETT  Additional Equipment: None  Intra-op Plan:   Post-operative Plan: Extubation in OR  Informed Consent: I have reviewed the patients History and Physical, chart, labs and discussed the procedure including the risks, benefits and alternatives for the proposed anesthesia with the patient or authorized representative who has indicated his/her understanding and acceptance.     Dental advisory given  Plan Discussed with: CRNA  Anesthesia Plan Comments:        Anesthesia Quick Evaluation

## 2023-10-10 NOTE — Anesthesia Procedure Notes (Addendum)
 Procedure Name: Intubation Date/Time: 10/10/2023 11:25 AM  Performed by: Melodee Spruce, CRNAPre-anesthesia Checklist: Patient identified, Emergency Drugs available, Suction available and Patient being monitored Patient Re-evaluated:Patient Re-evaluated prior to induction Oxygen Delivery Method: Circle system utilized Preoxygenation: Pre-oxygenation with 100% oxygen Induction Type: IV induction Ventilation: Mask ventilation without difficulty Laryngoscope Size: Miller and 3 Grade View: Grade I Tube type: Oral Tube size: 7.5 mm Number of attempts: 1 Airway Equipment and Method: Stylet and Oral airway Placement Confirmation: ETT inserted through vocal cords under direct vision, positive ETCO2 and breath sounds checked- equal and bilateral Secured at: 21 cm Tube secured with: Tape Dental Injury: Teeth and Oropharynx as per pre-operative assessment  Comments: [place] CRNA

## 2023-10-11 ENCOUNTER — Telehealth: Payer: Self-pay | Admitting: *Deleted

## 2023-10-11 ENCOUNTER — Encounter (HOSPITAL_COMMUNITY): Payer: Self-pay | Admitting: Psychiatry

## 2023-10-11 NOTE — Telephone Encounter (Signed)
 Spoke with Nicole Rasmussen this morning. She states she is eating, drinking and urinating well. She has not had a BM yet but is passing gas. She is taking senokot as prescribed and encouraged her to drink plenty of water. She denies fever or chills. Incisions are dry and intact. She rates her pain 5/10. Her pain is controlled with tramadol .    Instructed to call office with any fever, chills, purulent drainage, uncontrolled pain or any other questions or concerns. Patient verbalizes understanding.   Pt aware of post op appointments as well as the office number [phone] and after hours number [phone] to call if she has any questions or concerns

## 2023-10-12 LAB — SURGICAL PATHOLOGY

## 2023-10-16 ENCOUNTER — Ambulatory Visit: Payer: Self-pay | Admitting: Psychiatry

## 2023-10-23 ENCOUNTER — Encounter: Admitting: Psychiatry

## 2023-10-25 NOTE — Progress Notes (Signed)
 I saw Wylene Weissman in neurology clinic on 11/02/23 in follow up for numbness and tingling in arms and legs.  HPI: Janin Kozlowski is a 63 y.o. year old female with a history of B12 deficiency, OA c/b joint pain and back pain, GERD, HLD, prediabetes, vit D deficiency who we last saw on 05/11/23.  To briefly review: Initial consultation 12/17/21: Patient is having tingling in her left neck, arm, and leg. When she has back pain, it is also on the left. The tingling in the arms started in the last 6 months. It was coming and going until about 2 months ago when it became constant. It is a shock like sensation. It radiates from her neck into all of her fingers. Bending her neck (like when shampooing at the hair salon) makes the sensations worse. The pain is particularly uncomfortable at night and can wake her up. She can straighten her arm and it will help sometimes. She sleeps on her side, but tries to avoid her left side. She often has her arms curled up. The pain can be 10/10, but averages 6/10. It occasionally can complete go away. She occasionally gets similar symptoms on the right but very rare. She denies weakness. She does have pain in her right knee that makes walking difficult at times.    She takes gabapentin  400 mg QID (given by PCP). It helps knock down the pain along with diclofenac , flexeril, and tylenol . She has previously tried PT (many years ago) for her back and this did not help much.   She also endorses tingling in both feet (maybe right more than left). She does not currently have this tingling though.   Patient was seen by Dr. Avram in GI for abdominal pain of unknown origin. The clinic note from 09/17/21 was reviewed. Patient had previously been seen by [facility], but wanted second opinion for her pain, so consult to [facility] neurology was placed.    Patient previously saw Dr. Buck at [facility] (last seen 05/06/20). The clinic notes indicate patient had  burning/tingling sensations in her right thigh. She also described radiating pain from lower back and a long standing history of low back pain. Per 12/15/15 office note:  she had a lumbar spine MRI without contrast at [facility] on 09/27/2013: Depression: This is an abnormal MRI of the lumbar spine showing degenerative changes at L3-L4, L4-L5, and L5-S1 as detailed above. The post significant findings are at L5-S1 were severe facet hypertrophy causes moderate left foraminal narrowing that could lead to dynamic impingement of the left L5 nerve root. Patient had EMG on 01/27/16. Per clinic note on 04/18/16, the impression was:  This NCV/EMG study showed the following: 1.    No evidence of polyneuropathy.    2.    Mild chronic right L5 radiculopathy without active features. 3.    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   MRI of lumbar spine showed degenerative changes so patient was referred to spine. She saw Dr. Jerri in othropedics. The note from 10/10/20 indicates she was seen for low back pain. Patient was referred to PT, given diclofenac , and recommended to lose weight.   EtOH use: Socially, beer or wine (1-2 per day; 4 times per week)  Restrictive diet? No Family history of neuropathy/myopathy/NM disease? None   04/06/22: Labs showed a borderline low B12 level (284). EMG of the LUE was consistent with upper limb manifestations of PN with no evidence of cervical radiculopathy  or median or ulnar mononeuropathy.   Patient's pain is getting worse in her neck. It is still on the left. When she tilts her head back, she gets pain in the left neck and her arm tingling. She takes ibuprofen  for her knee, but this also helps with shoulder. She is also on flexeril and meloxicam for her neck discomfort.   She takes gabapentin  after work and again before bed usually. She takes 400 mg 2-3 times per day. It helps. She is happy with the relief she gets at night. She is  not able to take gabapentin  during the day because it makes her too sleepy.    She went to PT. She went to 6-7 sessions. This did not help, so she stopped going. She was doing home exercises, but this did not help.    She has also gotten a TENS unit, but this has also not helped much.  05/11/23: MRI cervical spine on 05/14/22 showed significant left foraminal stenosis at C5-6. Patient was referred to neurosurgery given worsening pain. She saw Dr. Onetha at [facility] who recommend surgery given failure of conservative treatment. Patient declined this due other issues going on including abdominal pain and lightheadedness and imbalance.   The left shoulder pain was doing much better until recently when it started to flare again. She continues to have tingling in her legs and feet. She is having a lot of numbness and burning in the feet.   Patient is no longer taking B12, stopping quite at while ago.   She continues to take gabapentin  100 mg in the morning and 300 mg in the evening. She takes this as needed. She will take it at least 3-4 times per week. She feels this dose is working for her.   Most recent Assessment and Plan (05/11/23): This is Nolah Krenzer, a 63 y.o. female with numbness and tingling in arms and legs, most prominent in her left arm. She has known cervical and lumbosacral spondylosis that is at least contributing to her symptoms. My EMG of her upper extremity suggested upper limb manifestations of a polyneuropathy as well. No carpal tunnel syndrome was seen. This is of note because given her GI issues and lightheadedness, this could suggest autonomic dysfunction, which in the setting of polyneuropathy makes amyloidosis, particularly hTTR amyloidosis a possibility. Of note, her echo showed no evidence of heart failure. Given that there is free, sponsored testing and a treatment, this would still be important to rule out though. In terms of risk factors for PN, she  has known pre-DM and borderline low B12 and no longer taking supplement. I will get labs and genetic testing today to look for treatable causes.   Plan: -Blood work: HbA1c, B1, B12, MMA, folate, kappa/lambda light chains -Genetic testing for hTTR amyloidosis -Refill gabapentin : 100 mg in the morning and 300 mg at night as this is working well for her pain -Fall precautions discussed  Since their last visit: Labs were significant for borderline low B12, low folate, and low B1. I recommended supplementation of each on 05/18/23. She is taking these.   hTTR amyloidosis genetic testing was normal.  Patient was diagnosed with endometrial cancer since last diagnosis. She had surgery but does need chemo or radiation and is currently cancer free.  Patient thinks her neuropathy may be getting a little worse. She has noticed more numbness and tingling in her feet (left more than right). Elevating her feet can help a little. She feels like her  left leg has a dent in it.   Patient is still taking gabapentin  100 mg (maybe twice during the day if at work) then will take 300 mg at night. It will make her sleepy so she cannot take more than 100 mg.  Her left arm continues to bother her. She plans to follow up with Dr. Onetha who had recommended surgery for cervical radiculopathy.   MEDICATIONS:  Outpatient Encounter Medications as of 11/02/2023  Medication Sig Note   acetaminophen  (TYLENOL ) 500 MG tablet Take 1,000 mg by mouth every 6 (six) hours as needed for moderate pain (pain score 4-6).    albuterol  (VENTOLIN  HFA) 108 (90 Base) MCG/ACT inhaler Inhale 2 puffs into the lungs every 4 (four) hours as needed for wheezing or shortness of breath.    aspirin EC 81 MG tablet Take 81 mg by mouth daily.    atorvastatin (LIPITOR) 10 MG tablet Take 10 mg by mouth at bedtime.    carboxymethylcellulose (REFRESH PLUS) 0.5 % SOLN Place 1 drop into both eyes 3 (three) times daily as needed (dry eyes).    cyanocobalamin  (VITAMIN B12) 500 MCG tablet Take 500 mcg by mouth daily.    cyclobenzaprine (FLEXERIL) 10 MG tablet Take 10 mg by mouth 3 (three) times daily as needed for muscle spasms.    EPINEPHrine  (EPIPEN  2-PAK) 0.3 mg/0.3 mL IJ SOAJ injection Inject 0.3 mg into the muscle as needed for anaphylaxis.    EPINEPHrine  (NEFFY ) 2 MG/0.1ML SOLN Place 1 spray into the nose as needed.    folic acid  (FOLVITE ) 1 MG tablet Take 1 tablet (1 mg total) by mouth daily.    ciprofloxacin (CIPRO) 500 MG/5ML (10%) suspension Take 500 mg by mouth 2 (two) times daily. Do NOT administer via tube (Patient not taking: Reported on 10/25/2023)    fluconazole (DIFLUCAN) 200 MG tablet Take 200 mg by mouth daily. (Patient not taking: Reported on 10/25/2023)    gabapentin  (NEURONTIN ) 100 MG capsule Take 1 capsule (100 mg) in the morning and take 3 capsules (300 mg) in the evening (Patient taking differently: Take 100 mg by mouth 2 (two) times daily as needed (pain).)    gabapentin  (NEURONTIN ) 300 MG capsule Take 300 mg by mouth at bedtime as needed (Nerve Pain).    levocetirizine (XYZAL ) 5 MG tablet Take 1 tablet (5 mg total) by mouth every evening.    meloxicam (MOBIC) 15 MG tablet Take 15 mg by mouth daily as needed for pain.    Multiple Vitamins-Minerals (CENTRUM SILVER  50+WOMEN) TABS Take 1 tablet by mouth daily.    Multiple Vitamins-Minerals (HAIR SKIN & NAILS PO) Take 1 tablet by mouth daily.    olmesartan (BENICAR) 20 MG tablet Take 20 mg by mouth daily.    RYALTRIS  665-25 MCG/ACT SUSP Place 2 sprays into the nose 2 (two) times daily.    senna-docusate (SENOKOT-S) 8.6-50 MG tablet Take 2 tablets by mouth at bedtime. For AFTER surgery, do not take if having diarrhea (Patient not taking: Reported on 10/25/2023) 10/02/2023: For after procedure    traMADol  (ULTRAM ) 50 MG tablet Take 1 tablet (50 mg total) by mouth every 6 (six) hours as needed for severe pain (pain score 7-10). For AFTER surgery only, do not take and drive 07/06/7972: For after  procedure    [DISCONTINUED] cetirizine (ZYRTEC) 10 MG tablet Take 10 mg by mouth daily. 10/02/2023: Waiting on Refill   [DISCONTINUED] fluticasone (FLONASE) 50 MCG/ACT nasal spray Place 2 sprays into both nostrils daily as needed for  allergies or rhinitis. (Patient not taking: Reported on 10/25/2023)    No facility-administered encounter medications on file as of 11/02/2023.    PAST MEDICAL HISTORY: Past Medical History:  Diagnosis Date   Acid reflux    Allergy     Anemia    Arthritis    Asthma    no inhalers   B12 deficiency    Cancer (HCC)    Dysmetabolic syndrome X    Dyspnea    Family history of adverse reaction to anesthesia    Food allergy     shellfish   GERD (gastroesophageal reflux disease)    HLD (hyperlipidemia)    Hypertension    Joint pain    knees and back   Lower back pain    Nerve pain    top of right thigh   Neuromuscular disorder (HCC)    bilateral leg pain from back issue   Pre-diabetes    Prediabetes    Shortness of breath    Sinus problem    Swallowing difficulty    Vitamin B 12 deficiency    Vitamin D  deficiency     PAST SURGICAL HISTORY: Past Surgical History:  Procedure Laterality Date   CESAREAN SECTION     1 time   COLONOSCOPY     ESOPHAGOGASTRODUODENOSCOPY     FOOT SURGERY Bilateral 1980's   bunionectomy and hammer toe on both feet   HYSTEROSCOPY WITH D & C N/A 08/11/2023   Procedure: DILATATION AND CURETTAGE /HYSTEROSCOPY;  Surgeon: Diedre Rosaline BRAVO, MD;  Location: [facility] OR;  Service: Gynecology;  Laterality: N/A;   INJECTION, FOR SENTINEL LYMPH NODE IDENTIFICATION Bilateral 10/10/2023   Procedure: INJECTION, FOR SENTINEL LYMPH NODE IDENTIFICATION;  Surgeon: Eldonna Mays, MD;  Location: [facility] ORS;  Service: Gynecology;  Laterality: Bilateral;   LYMPH NODE BIOPSY Bilateral 10/10/2023   Procedure: BILATERAL SENINTELY LYMPH NODE BIOPSY;  Surgeon: Eldonna Mays, MD;  Location: [facility]S;  Service: Gynecology;  Laterality: Bilateral;   MOUTH  SURGERY  2011   had implants/ root canal/bridges   ROBOTIC ASSISTED TOTAL HYSTERECTOMY WITH BILATERAL SALPINGO OOPHERECTOMY Bilateral 10/10/2023   Procedure: ROBOTIC-ASSISTED TOTAL LAPAROSCOPIC HYSTERECTOMY WITH BILATERAL SALPINGO-OOPHORECTOMY;  Surgeon: Eldonna Mays, MD;  Location: [facility];  Service: Gynecology;  Laterality: Bilateral;   WISDOM TOOTH EXTRACTION     with the implants    ALLERGIES: Allergies  Allergen Reactions   Shellfish Allergy  Hives and Swelling   Latex Hives   Sulfonamide Derivatives Itching and Rash    FAMILY HISTORY: Family History  Problem Relation Age of Onset   Hypertension Mother    Hyperlipidemia Mother    Heart disease Mother    Breast cancer Mother 40   Uterine cancer Mother    Kidney disease Father    Prostate cancer Father    Hyperlipidemia Sister    Diabetes Sister    Hypertension Sister    Hypertension Sister    Hypertension Sister    Hypertension Sister    Breast cancer Sister 66   Hyperlipidemia Brother    Hypertension Brother    Prostate cancer Brother    Hypertension Brother    Hypertension Brother    Hypertension Brother    Colon cancer Neg Hx    Esophageal cancer Neg Hx    Pancreatic cancer Neg Hx    Stomach cancer Neg Hx    Ovarian cancer Neg Hx     SOCIAL HISTORY: Social History   Tobacco Use   Smoking status: Former    Current packs/day: 0.00  Average packs/day: 1 pack/day for 20.0 years (20.0 ttl pk-yrs)    Types: Cigarettes    Start date: 05/02/1976    Quit date: 05/02/1996    Years since quitting: 27.5    Passive exposure: Never   Smokeless tobacco: Never  Vaping Use   Vaping status: Never Used  Substance Use Topics   Alcohol use: Not Currently    Comment: social   Drug use: No   Social History   Social History Narrative   Occasional caffeine use    Right handed    Live in a one level home husband and daughter when home from school   Duty Sheriff     Objective:  Vital Signs:  BP 128/82   Pulse  98   Ht 5' 8 (1.727 m)   Wt 246 lb (111.6 kg)   LMP 10/11/2010   SpO2 98%   BMI 37.40 kg/m   General: General appearance: Awake and alert. No distress. Cooperative with exam.  Skin: No obvious rash or jaundice. HEENT: Atraumatic. Anicteric. Lungs: Non-labored breathing on room air  Extremities: No edema. Musculoskeletal: No obvious joint swelling.  Neurological: Mental Status: Alert. Speech fluent. No pseudobulbar affect Cranial Nerves: CNII: No RAPD. Visual fields intact. CNIII, IV, VI: PERRL. No nystagmus. EOMI. CN V: Facial sensation intact bilaterally to fine touch. CN VII: Facial muscles symmetric and strong. No ptosis at rest. CN VIII: Hears finger rub well bilaterally. CN IX: No hypophonia. CN X: Palate elevates symmetrically. CN XI: Full strength shoulder shrug bilaterally. CN XII: Tongue protrusion full and midline. No atrophy or fasciculations. No significant dysarthria Motor: Tone is normal. Strength is 5/5 in bilateral upper and lower extremities Reflexes:  Right Left  Bicep 2+ 2+  Tricep 2+ 2+  BrRad 2+ 2+  Knee 2+ 2+  Ankle Trace Trace   Sensation: Pinprick: Intact in all extremities Vibration: Intact in all extremities Coordination: Intact finger-to- nose-finger and heel-to-shin bilaterally. Romberg negative. Gait: Able to rise from chair with arms crossed unassisted. Normal, narrow-based gait.  Lab and Test Review: New results: 05/11/23: HbA1c: 5.9 B12: 353 Folate low at 5.0 B1 low at 6 MMA wnl K/L light chains wnl hTTR genetic testing: negative  Previously reviewed results: 05/04/23: CBC unremarkable CMP unremarkable   12/17/21: B12 284, IFE and SPEP with no M protein   HbA1c (08/2019): 6.3 TSH: 1.44   EMG (01/31/22): NCV & EMG Findings: Extensive electrodiagnostic evaluation of the left upper limb with additional nerve conduction studies of the right upper limb shows: Absent median and ulnar sensory responses bilaterally. The left  radial sensory response is within normal limits. Bilateral medial antebrachial cutaneous sensory responses are absent and bilateral lateral antebrachial cutaneous sensory response show reduced amplitude (L8, R8 V). Left median (APB) and left ulnar (ADM) motor responses are within normal limits. There is no evidence of active or chronic motor axon loss changes affecting any of the tested muscles. Motor unit configuration and recruitment pattern is within normal limits.   Impression: This is an abnormal study of the left upper limb. 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  The absent bilateral medial antebrachial cutaneous sensory responses and low amplitude bilateral lateral antebrachial cutaneous sensory response are likely technical in nature and not due to bilateral plexopathy given the normal needle examination.  There  is no evidence of a left cervical (C5-T1) motor radiculopathy.  There is no definitive evidence of a left median mononeuropathy at or distal to the wrist (ie no evidence of left carpal tunnel syndrome).   MRI lumbar spine (11/17/16): FINDINGS:  The lumbar vertebrae demonstrate normal alignment, body height and marrow signal. T12-L1 and L1-L2show only minor facet hypertrophy without compression.L2-3 shows asymmetric facet hypertrophy resulting in mild left narrowing but no compression.L3-4 shows severe lateral disc osteophyte bulge to the left and  asymmetric facet hypertrophy resulting in severe foraminal narrowing on left and likely impingement on the exiting nerve root on left.L4-5 also shows similar changes but moderate narrowing the left foramina.SABRA L5-S1 also shows asymmetric facet hypertrophy with severe left sided foraminall narrowing with likely  impingement of the exiting nerve root.the conus medullaris terminates at L1. The visualized lower thoracic vertebrae and paraspinal soft tissue appear unremarkable.    MRI cervical spine (08/28/10): Findings: There is no abnormality at the foramen magnum, C1-2, C2-  3, C3-4 or C4-5.  The discs are normal.  The canal and foramina are  widely patent.  No osseous or articular finding.   At C5-6, there is spondylosis with endplate osteophytes and shallow  broad-based herniation more towards the left.  This effaces the  ventral subarachnoid space indents the cord slightly.  No foraminal  extension is seen.   C6-7, C7-T1 and T1-2 are normal.   IMPRESSION:  Single level pathology at C5-6, there is disc degeneration with  endplate osteophytes and shallow left posterolateral predominant  disc herniation.  This effaces the ventral subarachnoid space and  indents the cord slightly on the left.  No foraminal extension is  detected.    EMG (01/27/16 at [facility]): 1.    No evidence of polyneuropathy.    2.    Mild chronic right L5 radiculopathy without active features. 3.    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  CT abd/pelvis (05/04/23): IMPRESSION: 1. No filling defect is identified in the pulmonary arterial tree to suggest pulmonary embolus. 2. 2 mm punctate nonobstructive right mid kidney calculus. 3. Mild sigmoid colon diverticulosis. 4. Left foraminal impingement at L3-4, L4-5, and L5-S1 (most severe at L5-S1). 5. Aortic atherosclerosis.   MRI cervical spine wo contrast (05/14/22): FINDINGS: Alignment: Normal   Vertebrae: Normal marrow signal. No bone lesions or fractures.   Cord: Normal cord signal intensity. No cord lesions or syrinx.   Posterior Fossa, vertebral arteries, paraspinal tissues: No significant findings.   Disc levels:   C2-3: No significant findings.   C3-4: Very shallow central disc protrusion but no  neural compression. No foraminal stenosis.   C4-5: No significant findings.   C5-6: Diffuse bulging annulus and mild osteophytic ridging with flattening of the ventral thecal sac and mild narrowing of the ventral CSF space. There is also a left foraminal disc osteophyte complex with significant left foraminal stenosis likely involving the left C6 nerve root and responsible for the patient's left radicular symptoms.   C6-7: Minimal disc bulge but no significant disc protrusions, spinal or foraminal stenosis.   C7-T1: No significant findings. Slightly dilated exiting C8 nerve root sheaths purses perineural cysts. No significant findings.   IMPRESSION: 1. Left foraminal disc osteophyte complex at C5-6 with significant left foraminal stenosis likely responsible for the patient's left radicular symptoms. 2. Very shallow central disc protrusion at C3-4 but no neural compression. 3. Normal MR appearance of the cervical spinal cord.  ASSESSMENT: This is  Arland Southerly, a 63 y.o. 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 Risk factors include pre-DM, folate deficiency, B1 deficiency, and borderline low B12. She feels like her feet are a little worse than prior. Her exam is stable. She is benefiting from gabapentin , but it makes her too tired preventing increasing her dosage further. Cervical radiculopathy - Left C5-6 by MRI cervical spine. Patient follows with Dr. Onetha at [facility]  Plan: -Continue gabapentin  100 mg during the day and 300 mg at night -Lidocaine  cream PRN -Alpha lipoic acid 600 mg daily -Follow up with Dr. Onetha if interested in discussed cervical spine surgery -Continue B12, B1, folate supplementation  Return to clinic in 6 months  Total time spent reviewing records, interview, history/exam, documentation, and coordination of care on day of encounter:  30 min  Venetia Potters, MD

## 2023-10-30 ENCOUNTER — Inpatient Hospital Stay: Attending: Psychiatry

## 2023-10-30 ENCOUNTER — Ambulatory Visit: Payer: Self-pay | Admitting: Psychiatry

## 2023-10-30 ENCOUNTER — Inpatient Hospital Stay (HOSPITAL_BASED_OUTPATIENT_CLINIC_OR_DEPARTMENT_OTHER): Admitting: Psychiatry

## 2023-10-30 ENCOUNTER — Encounter: Payer: Self-pay | Admitting: Psychiatry

## 2023-10-30 VITALS — BP 128/88 | HR 66 | Temp 98.7°F | Resp 18 | Wt 247.6 lb

## 2023-10-30 DIAGNOSIS — R300 Dysuria: Secondary | ICD-10-CM | POA: Insufficient documentation

## 2023-10-30 DIAGNOSIS — C541 Malignant neoplasm of endometrium: Secondary | ICD-10-CM | POA: Diagnosis present

## 2023-10-30 DIAGNOSIS — Z90710 Acquired absence of both cervix and uterus: Secondary | ICD-10-CM

## 2023-10-30 DIAGNOSIS — Z90722 Acquired absence of ovaries, bilateral: Secondary | ICD-10-CM | POA: Insufficient documentation

## 2023-10-30 DIAGNOSIS — Z9079 Acquired absence of other genital organ(s): Secondary | ICD-10-CM | POA: Diagnosis not present

## 2023-10-30 DIAGNOSIS — N8502 Endometrial intraepithelial neoplasia [EIN]: Secondary | ICD-10-CM

## 2023-10-30 LAB — URINALYSIS, COMPLETE (UACMP) WITH MICROSCOPIC
Bacteria, UA: NONE SEEN
Bilirubin Urine: NEGATIVE
Glucose, UA: NEGATIVE mg/dL
Ketones, ur: NEGATIVE mg/dL
Leukocytes,Ua: NEGATIVE
Nitrite: NEGATIVE
Protein, ur: NEGATIVE mg/dL
Specific Gravity, Urine: 1.014 (ref 1.005–1.030)
pH: 6 (ref 5.0–8.0)

## 2023-10-30 NOTE — Progress Notes (Signed)
 Gynecologic Oncology Return Clinic Visit  Date of Service: 10/30/2023 Referring Provider: Rosaline Chapel, MD   Assessment & Plan: Nicole Rasmussen is a 63 y.o. 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  Postop: - Pt recovering well from surgery and healing appropriately postoperatively - Intraoperative findings and pathology results reviewed. - Ongoing postoperative expectations and precautions reviewed. Continue with no lifting >10lbs through 6 weeks postoperatively - Pt works part time as Midwife. Okay to return to work at 6 weeks. - Okay to return to routine care.   Dysuria: - Will send UA, Ucx. Follow-up results   RTC prn.  Hoy Masters, MD Gynecologic Oncology     ----------------------- Reason for Visit: Postop   Interval History: Pt reports that she is recovering well from surgery. She is using tylenol  prn for pain. She is eating and drinking well. She is voiding without issue and having regular bowel movements. Bright red blood on Sunday morning when having a bowel movement, when wiped. Very light discharge since. Occasional burning with urination, comes and goes.     Past Medical/Surgical History: Past Medical History:  Diagnosis Date   Acid reflux    Allergy     Anemia    Arthritis    Asthma    no inhalers   B12 deficiency    Cancer (HCC)    Dysmetabolic syndrome X    Dyspnea    Family history of adverse reaction to anesthesia    Food allergy     shellfish   GERD (gastroesophageal reflux disease)    HLD (hyperlipidemia)    Hypertension    Joint pain    knees and back   Lower back pain    Nerve pain    top of right thigh   Neuromuscular disorder (HCC)    bilateral leg pain from back issue   Pre-diabetes    Prediabetes    Shortness of breath    Sinus problem    Swallowing difficulty    Vitamin B 12 deficiency    Vitamin D  deficiency     Past Surgical History:  Procedure Laterality Date   CESAREAN SECTION      1 time   COLONOSCOPY     ESOPHAGOGASTRODUODENOSCOPY     FOOT SURGERY Bilateral 1980's   bunionectomy and hammer toe on both feet   HYSTEROSCOPY WITH D & C N/A 08/11/2023   Procedure: DILATATION AND CURETTAGE /HYSTEROSCOPY;  Surgeon: Chapel Rosaline BRAVO, MD;  Location: [facility];  Service: Gynecology;  Laterality: N/A;   INJECTION, FOR SENTINEL LYMPH NODE IDENTIFICATION Bilateral 10/10/2023   Procedure: INJECTION, FOR SENTINEL LYMPH NODE IDENTIFICATION;  Surgeon: Masters Hoy, MD;  Location: [facility];  Service: Gynecology;  Laterality: Bilateral;   LYMPH NODE BIOPSY Bilateral 10/10/2023   Procedure: BILATERAL SENINTELY LYMPH NODE BIOPSY;  Surgeon: Masters Hoy, MD;  Location: [facility];  Service: Gynecology;  Laterality: Bilateral;   MOUTH SURGERY  2011   had implants/ root canal/bridges   ROBOTIC ASSISTED TOTAL HYSTERECTOMY WITH BILATERAL SALPINGO OOPHERECTOMY Bilateral 10/10/2023   Procedure: ROBOTIC-ASSISTED TOTAL LAPAROSCOPIC HYSTERECTOMY WITH BILATERAL SALPINGO-OOPHORECTOMY;  Surgeon: Masters Hoy, MD;  Location: [facility];  Service: Gynecology;  Laterality: Bilateral;   WISDOM TOOTH EXTRACTION     with the implants    Family History  Problem Relation Age of Onset   Hypertension Mother    Hyperlipidemia Mother    Heart disease Mother    Breast cancer Mother 60   Uterine cancer Mother    Kidney  disease Father    Prostate cancer Father    Hyperlipidemia Sister    Diabetes Sister    Hypertension Sister    Hypertension Sister    Hypertension Sister    Hypertension Sister    Breast cancer Sister 23   Hyperlipidemia Brother    Hypertension Brother    Prostate cancer Brother    Hypertension Brother    Hypertension Brother    Hypertension Brother    Colon cancer Neg Hx    Esophageal cancer Neg Hx    Pancreatic cancer Neg Hx    Stomach cancer Neg Hx    Ovarian cancer Neg Hx     Social History   Socioeconomic History   Marital status: Married    Spouse name: Marsha    Number of children: 1   Years of education: BA   Highest education level: Not on file  Occupational History   Occupation: Retired, Lexicographer: [employer]  Tobacco Use   Smoking status: Former    Current packs/day: 0.00    Average packs/day: 1 pack/day for 20.0 years (20.0 ttl pk-yrs)    Types: Cigarettes    Start date: 05/02/1976    Quit date: 05/02/1996    Years since quitting: 27.5    Passive exposure: Never   Smokeless tobacco: Never  Vaping Use   Vaping status: Never Used  Substance and Sexual Activity   Alcohol use: Not Currently    Comment: social   Drug use: No   Sexual activity: Yes    Birth control/protection: Post-menopausal  Other Topics Concern   Not on file  Social History Narrative   Occasional caffeine use    Right handed    Live in a one level home husband and daughter when home from school   Duty Sheriff    Social Drivers of Health   Financial Resource Strain: Not on file  Food Insecurity: No Food Insecurity (09/08/2023)   Hunger Vital Sign    Worried About Running Out of Food in the Last Year: Never true    Ran Out of Food in the Last Year: Never true  Transportation Needs: No Transportation Needs (09/08/2023)   PRAPARE - Administrator, Civil Service (Medical): No    Lack of Transportation (Non-Medical): No  Physical Activity: Not on file  Stress: Not on file  Social Connections: Not on file    Current Medications:  Current Outpatient Medications:    acetaminophen  (TYLENOL ) 500 MG tablet, Take 1,000 mg by mouth every 6 (six) hours as needed for moderate pain (pain score 4-6)., Disp: , Rfl:    albuterol  (VENTOLIN  HFA) 108 (90 Base) MCG/ACT inhaler, Inhale 2 puffs into the lungs every 4 (four) hours as needed for wheezing or shortness of breath., Disp: 18 g, Rfl: 1   aspirin EC 81 MG tablet, Take 81 mg by mouth daily., Disp: , Rfl:    atorvastatin (LIPITOR) 10 MG tablet, Take 10 mg by mouth at bedtime., Disp: , Rfl:     carboxymethylcellulose (REFRESH PLUS) 0.5 % SOLN, Place 1 drop into both eyes 3 (three) times daily as needed (dry eyes)., Disp: , Rfl:    cetirizine (ZYRTEC) 10 MG tablet, Take 10 mg by mouth daily., Disp: , Rfl:    cyanocobalamin (VITAMIN B12) 500 MCG tablet, Take 500 mcg by mouth daily., Disp: , Rfl:    cyclobenzaprine (FLEXERIL) 10 MG tablet, Take 10 mg by mouth 3 (three) times daily as needed for  muscle spasms., Disp: , Rfl:    EPINEPHrine  (EPIPEN  2-PAK) 0.3 mg/0.3 mL IJ SOAJ injection, Inject 0.3 mg into the muscle as needed for anaphylaxis., Disp: 2 each, Rfl: 1   EPINEPHrine  (NEFFY ) 2 MG/0.1ML SOLN, Place 1 spray into the nose as needed., Disp: 4 each, Rfl: 1   folic acid  (FOLVITE ) 1 MG tablet, Take 1 tablet (1 mg total) by mouth daily., Disp: 30 tablet, Rfl: 5   gabapentin  (NEURONTIN ) 300 MG capsule, Take 300 mg by mouth at bedtime as needed (Nerve Pain)., Disp: , Rfl:    levocetirizine (XYZAL ) 5 MG tablet, Take 1 tablet (5 mg total) by mouth every evening., Disp: 30 tablet, Rfl: 5   meloxicam (MOBIC) 15 MG tablet, Take 15 mg by mouth daily as needed for pain., Disp: , Rfl:    Multiple Vitamins-Minerals (CENTRUM SILVER  50+WOMEN) TABS, Take 1 tablet by mouth daily., Disp: , Rfl:    Multiple Vitamins-Minerals (HAIR SKIN & NAILS PO), Take 1 tablet by mouth daily., Disp: , Rfl:    olmesartan (BENICAR) 20 MG tablet, Take 20 mg by mouth daily., Disp: , Rfl:    RYALTRIS  665-25 MCG/ACT SUSP, Place 2 sprays into the nose 2 (two) times daily., Disp: 29 g, Rfl: 5   traMADol  (ULTRAM ) 50 MG tablet, Take 1 tablet (50 mg total) by mouth every 6 (six) hours as needed for severe pain (pain score 7-10). For AFTER surgery only, do not take and drive, Disp: 10 tablet, Rfl: 0   ciprofloxacin (CIPRO) 500 MG/5ML (10%) suspension, Take 500 mg by mouth 2 (two) times daily. Do NOT administer via tube (Patient not taking: Reported on 10/25/2023), Disp: , Rfl:    fluconazole (DIFLUCAN) 200 MG tablet, Take 200 mg by  mouth daily. (Patient not taking: Reported on 10/25/2023), Disp: , Rfl:    fluticasone (FLONASE) 50 MCG/ACT nasal spray, Place 2 sprays into both nostrils daily as needed for allergies or rhinitis. (Patient not taking: Reported on 10/25/2023), Disp: , Rfl:    gabapentin  (NEURONTIN ) 100 MG capsule, Take 1 capsule (100 mg) in the morning and take 3 capsules (300 mg) in the evening (Patient taking differently: Take 100 mg by mouth 2 (two) times daily as needed (pain).), Disp: 120 capsule, Rfl: 5   senna-docusate (SENOKOT-S) 8.6-50 MG tablet, Take 2 tablets by mouth at bedtime. For AFTER surgery, do not take if having diarrhea (Patient not taking: Reported on 10/25/2023), Disp: 30 tablet, Rfl: 0  Review of Symptoms: Complete 10-system review is positive for: Urinary frequency, back pain, vision problems, abdominal pain, vaginal bleeding, vaginal discharge  Physical Exam: BP 128/88 Comment: manual recheck, NP aware  Pulse 66   Temp 98.7 F (37.1 C) (Oral)   Resp 18   Wt 247 lb 9.6 oz (112.3 kg)   LMP 10/11/2010   SpO2 100%   BMI 37.65 kg/m  General: Alert, oriented, no acute distress. HEENT: Normocephalic, atraumatic. Neck symmetric without masses. Sclera anicteric.  Chest: Normal work of breathing. Clear to auscultation bilaterally.   Cardiovascular: Regular rate and rhythm, no murmurs. Abdomen: Soft, nontender.  Normoactive bowel sounds.  No masses appreciated.  Well-healing incisions with glue. Extremities: Grossly normal range of motion.  Warm, well perfused.  No edema bilaterally. Skin: No rashes or lesions noted. GU: Normal appearing external genitalia without erythema, excoriation, or lesions.  Speculum exam reveals well healing intact vaginal cuff.  Bimanual exam reveals intact vaginal cuff. Exam chaperoned by Kimberly Swaziland, CMA   Laboratory & Radiologic Studies: Surgical pathology (  10/10/23): A. RIGHT OBTURATOR SENTINEL LYMPH NODE, EXCISION:  Three benign lymph nodes, negative for  carcinoma (0/3)   B. LEFT OBTURATOR SENTINEL LYMPH NODE, EXCISION:  One benign lymph node, negative for carcinoma (0/1)   C. 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

## 2023-10-30 NOTE — Patient Instructions (Signed)
 It was a pleasure to see you in clinic today. - No lifting until 6 weeks postop. Nothing in the vagina until 10weeks postop - We will update you regarding urine study.  Thank you very much for allowing me to provide care for you today.  I appreciate your confidence in choosing our Gynecologic Oncology team at [facility].  If you have any questions about your visit today please call our office or send us  a MyChart message and we will get back to you as soon as possible.

## 2023-10-31 LAB — URINE CULTURE: Culture: NO GROWTH

## 2023-11-02 ENCOUNTER — Encounter: Payer: Self-pay | Admitting: Neurology

## 2023-11-02 ENCOUNTER — Ambulatory Visit (INDEPENDENT_AMBULATORY_CARE_PROVIDER_SITE_OTHER): Payer: 59 | Admitting: Neurology

## 2023-11-02 VITALS — BP 128/82 | HR 98 | Ht 68.0 in | Wt 246.0 lb

## 2023-11-02 DIAGNOSIS — R202 Paresthesia of skin: Secondary | ICD-10-CM

## 2023-11-02 DIAGNOSIS — R200 Anesthesia of skin: Secondary | ICD-10-CM

## 2023-11-02 DIAGNOSIS — M5412 Radiculopathy, cervical region: Secondary | ICD-10-CM | POA: Diagnosis not present

## 2023-11-02 DIAGNOSIS — R7303 Prediabetes: Secondary | ICD-10-CM

## 2023-11-02 DIAGNOSIS — E538 Deficiency of other specified B group vitamins: Secondary | ICD-10-CM | POA: Diagnosis not present

## 2023-11-02 DIAGNOSIS — G629 Polyneuropathy, unspecified: Secondary | ICD-10-CM

## 2023-11-02 NOTE — Patient Instructions (Addendum)
 Continue gabapentin  100 mg at work and 300 mg at night/home.  You can also try Lidocaine  cream as needed. Apply wear you have pain, tingling, or burning. Wear gloves to prevent your hands being numb. This can be bought over the counter at any drug store or online.  Alpha lipoic acid 600mg  daily has some research data suggesting it helps with nerve health. No major side effects other than <1% of people report upset stomach. This can be taken twice per day (1200mg  daily) if no relief obtained. You can buy this over the counter or online.  Follow up with Dr. Onetha if you decide to discuss spine surgery further.  I will see you back in 6 months or sooner if needed. Please let me know if you have any questions or concerns in the meantime.   The physicians and staff at [facility] are committed to providing excellent care. You may receive a survey requesting feedback about your experience at our office. We strive to receive very good responses to the survey questions. If you feel that your experience would prevent you from giving the office a very good  response, please contact our office to try to remedy the situation. We may be reached at [phone]. Thank you for taking the time out of your busy day to complete the survey.  Venetia Potters, MD [facility]

## 2023-11-25 ENCOUNTER — Other Ambulatory Visit: Payer: Self-pay

## 2023-11-25 ENCOUNTER — Encounter (HOSPITAL_BASED_OUTPATIENT_CLINIC_OR_DEPARTMENT_OTHER): Payer: Self-pay | Admitting: Emergency Medicine

## 2023-11-25 ENCOUNTER — Emergency Department (HOSPITAL_BASED_OUTPATIENT_CLINIC_OR_DEPARTMENT_OTHER)

## 2023-11-25 ENCOUNTER — Emergency Department (HOSPITAL_BASED_OUTPATIENT_CLINIC_OR_DEPARTMENT_OTHER)
Admission: EM | Admit: 2023-11-25 | Discharge: 2023-11-26 | Disposition: A | Discharge status: Home / Self Care | Source: Intra-hospital | Attending: Emergency Medicine | Admitting: Emergency Medicine

## 2023-11-25 DIAGNOSIS — Z7982 Long term (current) use of aspirin: Secondary | ICD-10-CM | POA: Insufficient documentation

## 2023-11-25 DIAGNOSIS — Z91040 Latex allergy status: Secondary | ICD-10-CM | POA: Diagnosis not present

## 2023-11-25 DIAGNOSIS — R1013 Epigastric pain: Secondary | ICD-10-CM | POA: Insufficient documentation

## 2023-11-25 DIAGNOSIS — J45909 Unspecified asthma, uncomplicated: Secondary | ICD-10-CM | POA: Insufficient documentation

## 2023-11-25 DIAGNOSIS — R079 Chest pain, unspecified: Secondary | ICD-10-CM | POA: Insufficient documentation

## 2023-11-25 LAB — COMPREHENSIVE METABOLIC PANEL WITH GFR
ALT: 24 U/L (ref 0–44)
AST: 30 U/L (ref 15–41)
Albumin: 4.6 g/dL (ref 3.5–5.0)
Alkaline Phosphatase: 88 U/L (ref 38–126)
Anion gap: 14 (ref 5–15)
BUN: 12 mg/dL (ref 8–23)
CO2: 25 mmol/L (ref 22–32)
Calcium: 10.2 mg/dL (ref 8.9–10.3)
Chloride: 104 mmol/L (ref 98–111)
Creatinine, Ser: 0.72 mg/dL (ref 0.44–1.00)
GFR, Estimated: >60 mL/min (ref >60)
Glucose, Bld: 81 mg/dL (ref 70–99)
Potassium: 3.7 mmol/L (ref 3.5–5.1)
Sodium: 142 mmol/L (ref 135–145)
Total Bilirubin: 0.7 mg/dL (ref 0.0–1.2)
Total Protein: 7.8 g/dL (ref 6.5–8.1)

## 2023-11-25 LAB — CBC WITH DIFFERENTIAL/PLATELET
Abs Immature Granulocytes: 0.02 K/uL (ref 0.00–0.07)
Basophils Absolute: 0 K/uL (ref 0.0–0.1)
Basophils Relative: 0 %
Eosinophils Absolute: 0.1 K/uL (ref 0.0–0.5)
Eosinophils Relative: 1 %
HCT: 38.9 % (ref 36.0–46.0)
Hemoglobin: 12.9 g/dL (ref 12.0–15.0)
Immature Granulocytes: 0 %
Lymphocytes Relative: 31 %
Lymphs Abs: 2.3 K/uL (ref 0.7–4.0)
MCH: 28 pg (ref 26.0–34.0)
MCHC: 33.2 g/dL (ref 30.0–36.0)
MCV: 84.4 fL (ref 80.0–100.0)
Monocytes Absolute: 0.6 K/uL (ref 0.1–1.0)
Monocytes Relative: 9 %
Neutro Abs: 4.3 K/uL (ref 1.7–7.7)
Neutrophils Relative %: 59 %
Platelets: 173 K/uL (ref 150–400)
RBC: 4.61 MIL/uL (ref 3.87–5.11)
RDW: 13.2 % (ref 11.5–15.5)
WBC: 7.4 K/uL (ref 4.0–10.5)
nRBC: 0 % (ref 0.0–0.2)

## 2023-11-25 LAB — D-DIMER, QUANTITATIVE: D-Dimer, Quant: 2.59 ug{FEU}/mL — ABNORMAL HIGH (ref 0.00–0.50)

## 2023-11-25 LAB — TROPONIN T, HIGH SENSITIVITY: Troponin T High Sensitivity: <15 ng/L (ref <19)

## 2023-11-25 MED ORDER — IOHEXOL 350 MG/ML SOLN
75.0000 mL | Freq: Once | INTRAVENOUS | Status: AC | PRN
  Administered 2023-11-25: 75 mL via INTRAVENOUS

## 2023-11-25 MED ORDER — FAMOTIDINE 20 MG PO TABS
20.0000 mg | ORAL_TABLET | Freq: Once | ORAL | Status: AC
  Administered 2023-11-25: 20 mg via ORAL
  Filled 2023-11-25: qty 1

## 2023-11-25 MED ORDER — PANTOPRAZOLE SODIUM 40 MG PO TBEC
40.0000 mg | DELAYED_RELEASE_TABLET | Freq: Once | ORAL | Status: AC
  Administered 2023-11-25: 40 mg via ORAL
  Filled 2023-11-25: qty 1

## 2023-11-25 MED ORDER — ALUM & MAG HYDROXIDE-SIMETH 200-200-20 MG/5ML PO SUSP
30.0000 mL | Freq: Once | ORAL | Status: AC
  Administered 2023-11-25: 30 mL via ORAL
  Filled 2023-11-25: qty 30

## 2023-11-25 NOTE — ED Provider Notes (Signed)
 [place] [facility]MENT AT [facility] Provider Note   CSN: [id number] Arrival date & time: 11/25/23  1758     Patient presents with: Gastroesophageal Reflux   Nicole Rasmussen is a 63 y.o. female.   Gastroesophageal Reflux Associated symptoms include chest pain.  Patient is a 63 year old female to the ED today for concerns for a 2-day history of burning chest pain, similar to her GERD that she has had in the past.  Noted that she had been recently scheduled for an EGD but had to have it rescheduled due to having hysterectomy secondary to endometrial cancer.  Hysterectomy was done a month and a half ago.  Reporting some mild epigastric abdominal pain.  Pain has significantly decreased since last night.  Denies fever, chills, shortness of breath, cough, hematemesis, nausea, vomiting, dysuria, hematuria, vaginal discharge, lower leg swelling.     Prior to Admission medications   Medication Sig Start Date End Date Taking? Authorizing Provider  acetaminophen  (TYLENOL ) 500 MG tablet Take 1,000 mg by mouth every 6 (six) hours as needed for moderate pain (pain score 4-6).    [provider]  albuterol  (VENTOLIN  HFA) 108 (90 Base) MCG/ACT inhaler Inhale 2 puffs into the lungs every 4 (four) hours as needed for wheezing or shortness of breath. 10/04/23   Jeneal Danita Macintosh, MD  aspirin EC 81 MG tablet Take 81 mg by mouth daily.    [provider]  atorvastatin (LIPITOR) 10 MG tablet Take 10 mg by mouth at bedtime.    [provider]  carboxymethylcellulose (REFRESH PLUS) 0.5 % SOLN Place 1 drop into both eyes 3 (three) times daily as needed (dry eyes).    [provider]  ciprofloxacin (CIPRO) 500 MG/5ML (10%) suspension Take 500 mg by mouth 2 (two) times daily. Do NOT administer via tube Patient not taking: Reported on 11/02/2023    [provider]  cyanocobalamin (VITAMIN B12) 500 MCG tablet Take 500 mcg by mouth daily.     [provider]  cyclobenzaprine (FLEXERIL) 10 MG tablet Take 10 mg by mouth 3 (three) times daily as needed for muscle spasms.    [provider]  EPINEPHrine  (EPIPEN  2-PAK) 0.3 mg/0.3 mL IJ SOAJ injection Inject 0.3 mg into the muscle as needed for anaphylaxis. 12/22/21   Marinda Rocky SAILOR, MD  EPINEPHrine  (NEFFY ) 2 MG/0.1ML SOLN Place 1 spray into the nose as needed. 10/04/23   Jeneal Danita Macintosh, MD  fluconazole (DIFLUCAN) 200 MG tablet Take 200 mg by mouth daily. Patient not taking: Reported on 11/02/2023    [provider]  folic acid  (FOLVITE ) 1 MG tablet Take 1 tablet (1 mg total) by mouth daily. 05/22/23   Leigh Venetia CROME, MD  gabapentin  (NEURONTIN ) 100 MG capsule Take 1 capsule (100 mg) in the morning and take 3 capsules (300 mg) in the evening Patient taking differently: as needed. Take 1 capsule (100 mg) in the morning and take 3 capsules (300 mg) in the evening 05/11/23   Leigh Venetia CROME, MD  gabapentin  (NEURONTIN ) 300 MG capsule Take 300 mg by mouth at bedtime as needed (Nerve Pain). 07/25/23   [provider]  levocetirizine (XYZAL ) 5 MG tablet Take 1 tablet (5 mg total) by mouth every evening. 10/04/23   Jeneal Danita Macintosh, MD  meloxicam (MOBIC) 15 MG tablet Take 15 mg by mouth daily as needed for pain.    [provider]  Multiple Vitamins-Minerals (CENTRUM SILVER  50+WOMEN) TABS Take 1 tablet by mouth daily.  [provider]  Multiple Vitamins-Minerals (HAIR SKIN & NAILS PO) Take 1 tablet by mouth daily.    [provider]  olmesartan (BENICAR) 20 MG tablet Take 20 mg by mouth daily. 08/15/22   [provider]  RYALTRIS  334-74 MCG/ACT SUSP Place 2 sprays into the nose 2 (two) times daily. 10/04/23   Jeneal Danita Macintosh, MD  senna-docusate (SENOKOT-S) 8.6-50 MG tablet Take 2 tablets by mouth at bedtime. For AFTER surgery, do not take if having diarrhea Patient not taking: Reported on 11/02/2023 09/11/23   Micheline Setter D, NP  traMADol  (ULTRAM ) 50 MG tablet Take 1 tablet (50 mg total) by mouth every 6 (six) hours as needed for severe pain (pain score 7-10). For AFTER surgery only, do not take and drive Patient not taking: Reported on 11/02/2023 09/11/23   Micheline Setter D, NP    Allergies: Shellfish allergy , Latex, and Sulfonamide derivatives    Review of Systems  Cardiovascular:  Positive for chest pain.  All other systems reviewed and are negative.   Updated Vital Signs BP 110/75   Pulse 71   Temp 97.8 F (36.6 C)   Resp 16   Ht 5' 8 (1.727 m)   Wt 111.6 kg   LMP 10/11/2010   SpO2 100%   BMI 37.41 kg/m   Physical Exam Vitals and nursing note reviewed.  Constitutional:      General: She is not in acute distress.    Appearance: Normal appearance. She is not ill-appearing or diaphoretic.  HENT:     Head: Normocephalic and atraumatic.  Eyes:     General:        Right eye: No discharge.        Left eye: No discharge.     Extraocular Movements: Extraocular movements intact.     Conjunctiva/sclera: Conjunctivae normal.  Cardiovascular:     Rate and Rhythm: Normal rate and regular rhythm.     Pulses: Normal pulses.     Heart sounds: Normal heart sounds. No murmur heard.    No friction rub. No gallop.  Pulmonary:     Effort: Pulmonary effort is normal. No respiratory distress.     Breath sounds: Normal breath sounds. No stridor. No wheezing, rhonchi or rales.  Abdominal:     General: Abdomen is flat. There is no distension.     Palpations: Abdomen is soft.     Tenderness: There is abdominal tenderness (Mild epigastric tenderness noted palpation). There is no right CVA tenderness, left CVA tenderness, guarding or rebound.     Hernia: No hernia is present.  Musculoskeletal:        General: No deformity or signs of injury.     Cervical back: Normal range of motion and neck supple. No rigidity or tenderness.     Right lower leg: No edema.     Left lower leg: No edema.  Skin:     General: Skin is warm and dry.     Findings: No bruising, erythema or lesion.  Neurological:     General: No focal deficit present.     Mental Status: She is alert and oriented to person, place, and time. Mental status is at baseline.     Sensory: No sensory deficit.     Motor: No weakness.     Coordination: Coordination normal.     Gait: Gait normal.  Psychiatric:        Mood and Affect: Mood normal.     (all labs ordered are listed,  but only abnormal results are displayed) Labs Reviewed  D-DIMER, QUANTITATIVE - Abnormal; Notable for the following components:      Result Value   D-Dimer, Quant 2.59 (*)    All other components within normal limits  COMPREHENSIVE METABOLIC PANEL WITH GFR  CBC WITH DIFFERENTIAL/PLATELET  TROPONIN T, HIGH SENSITIVITY    EKG: EKG Interpretation Date/Time:  Saturday November 25 2023 18:09:34 EDT Ventricular Rate:  84 PR Interval:  187 QRS Duration:  94 QT Interval:  362 QTC Calculation: 428 R Axis:   6  Text Interpretation: Sinus rhythm Low voltage, precordial leads No significant change since last tracing Confirmed by Dreama Longs ([id number]) on 11/25/2023 7:32:06 PM  Radiology: [facility] Chest 2 View Result Date: 11/25/2023 CLINICAL DATA:  Chest pain EXAM: CHEST - 2 VIEW COMPARISON:  Chest x-ray 05/04/2023 FINDINGS: The heart size and mediastinal contours are within normal limits. Both lungs are clear. The visualized skeletal structures are unremarkable. IMPRESSION: No active cardiopulmonary disease. Electronically Signed   By: Greig Pique M.D.   On: 11/25/2023 18:43    Procedures   Medications Ordered in the ED  famotidine  (PEPCID ) tablet 20 mg (20 mg Oral Given 11/25/23 2002)  alum & mag hydroxide-simeth (MAALOX/MYLANTA) 200-200-20 MG/5ML suspension 30 mL (30 mLs Oral Given 11/25/23 2003)  pantoprazole  (PROTONIX ) EC tablet 40 mg (40 mg Oral Given 11/25/23 2002)  iohexol  (OMNIPAQUE ) 350 MG/ML injection 75 mL (75 mLs Intravenous Contrast Given 11/25/23  2357)                                Medical Decision Making Amount and/or Complexity of Data Reviewed Labs: ordered. Radiology: ordered. ECG/medicine tests: ordered.  Risk OTC drugs. Prescription drug management.   This patient is a [age]-year-old female who presents to the ED for concern of 2-day history of burning chest pain similar to previous GERD.  Noted to have a previous history of hysterectomy done in early June.  Reports that she did have 1 of a sewed of finding it hard to breathe which was when she was having the burning sensation in her throat.  Does not have any exertional symptoms.  No pleuritic symptoms.  On physical exam, patient is in no acute distress, afebrile, alert and orient x 4, speaking in full sentences, nontachypneic, nontachycardic.  LCTAB, RRR, no lower leg edema, no murmur.  Abdomen is mildly tender to the epigastric region but otherwise nontender.  Unremarkable exam otherwise.  Surgical site healing well with no signs of infection.  Provided GI cocktail, famotidine  and Protonix , patient states that she has felt better but with symptoms not fully abated at this time.  Shared decision making with patient for concerns for blood clot risk, patient states that she wishes to have D-dimer and appropriate CT done afterward if positive.  D-dimer ordered and was positive, CT scan ordered at this time.  Pending CT results.  Anticipate likely discharge with negative CT however patient care transferred over to Dr. Lorette at this time.  Differential diagnoses prior to evaluation: The emergent differential diagnosis includes, but is not limited to, ACS, AAS, Pulmonary Embolism, Tension Pneumothorax, Esophageal Rupture, Cardiac Tamponade, Pericarditis, Myocarditis, Pneumothorax, Pneumonia, Aortic Stenosis, CHF Exacerbation, GERD,  Esophageal Spasm,  Mallory-Weiss, Costochondritis, Musculoskeletal Chest Wall Pain, Anxiety / Panic Attack. This is not an exhaustive differential.    Past Medical History / Co-morbidities / Social History: GERD, anemia, HLD, asthma, status post hysterectomy, prediabetes,  Additional  history: Chart reviewed. Pertinent results include:   Underwent hysterectomy on 10/10/2023 secondary to endometrial cancer.  Saw neurology on 11/02/2023 noting numbness and healing in both legs at that time continuing with gabapentin  for this.  Also noting cervical radiculopathy, being followed by neurosurgery.  Lab Tests/Imaging studies: I personally interpreted labs/imaging and the pertinent results include:    CBC unremarkable BMP unremarkable Troponin unremarkable Chest x-ray unremarkable D-dimer 2.59 CT scan PE study pending  Cardiac monitoring: EKG obtained and interpreted by myself and attending physician which shows: Sinus rhythm  EKG Interpretation Date/Time:  Saturday November 25 2023 18:09:34 EDT Ventricular Rate:  84 PR Interval:  187 QRS Duration:  94 QT Interval:  362 QTC Calculation: 428 R Axis:   6  Text Interpretation: Sinus rhythm Low voltage, precordial leads No significant change since last tracing Confirmed by Dreama Longs ([id number]) on 11/25/2023 7:32:06 PM          Medications: I ordered medication including Protonix , famotidine , GI cocktail.  I have reviewed the patients home medicines and have made adjustments as needed.  Critical Interventions: None  Social Determinants of Health: None  Disposition: 12:03 AM Care of Nicole Rasmussen transferred to Dr. Lorette at the end of my shift as the patient will require reassessment once labs/imaging have resulted. Patient presentation, ED course, and plan of care discussed with review of all pertinent labs and imaging. Please see his/her note for further details regarding further ED course and disposition. Plan at time of handoff is awaiting PE study, treat if needed, discharge with GERD meds otherwise.. This may be altered or completely changed at the discretion of the  oncoming team pending results of further workup.   Final diagnoses:  Chest pain, unspecified type    ED Discharge Orders     None          Beola Terrall RAMAN, [place] 11/26/23 0003    Dreama Longs, MD 11/26/23 1146

## 2023-11-25 NOTE — ED Provider Notes (Signed)
 11:47 PM Assumed care from Dr. Dreama and PA Bettyann, please see their note for full history, physical and decision making until this point. In brief this is a 63 y.o. year old female who presented to the ED tonight with Gastroesophageal Reflux     Likely GERD but had paliptiations, recent surgery, cancer, elevated d dimer. Pending cta for dispo.  CTA negative. Feels well. Stable for d/c w/ pcp follow up.  Discharge instructions, including strict return precautions for new or worsening symptoms, given. Patient and/or family verbalized understanding and agreement with the plan as described.   Labs, studies and imaging reviewed by myself and considered in medical decision making if ordered. Imaging interpreted by radiology.  Labs Reviewed  D-DIMER, QUANTITATIVE - Abnormal; Notable for the following components:      Result Value   D-Dimer, Quant 2.59 (*)    All other components within normal limits  COMPREHENSIVE METABOLIC PANEL WITH GFR  CBC WITH DIFFERENTIAL/PLATELET  TROPONIN T, HIGH SENSITIVITY    DG Chest 2 View  Final Result    CT Angio Chest PE W and/or Wo Contrast    (Results Pending)    No follow-ups on file.    Nicole Rasmussen, Selinda, MD 11/26/23 [phone]

## 2023-11-25 NOTE — ED Notes (Signed)
 Pt reports feeling palpitations appx 10 mins ago.  EDP Terrall made aware.

## 2023-11-25 NOTE — ED Triage Notes (Signed)
 Pt has been having acid reflux for sometime; was scheduled for EGD, but had to cancel for another surgery; sts she is having trouble swallowing; also reports mid CP, intermittent SHOB, dizziness and eyes feel dilated; also reports BUE arm pain

## 2024-02-02 ENCOUNTER — Encounter (HOSPITAL_BASED_OUTPATIENT_CLINIC_OR_DEPARTMENT_OTHER): Payer: Self-pay | Admitting: Emergency Medicine

## 2024-02-02 ENCOUNTER — Emergency Department (HOSPITAL_BASED_OUTPATIENT_CLINIC_OR_DEPARTMENT_OTHER)
Admission: EM | Admit: 2024-02-02 | Discharge: 2024-02-02 | Disposition: A | Discharge status: Home / Self Care | Attending: Emergency Medicine | Admitting: Emergency Medicine

## 2024-02-02 ENCOUNTER — Other Ambulatory Visit: Payer: Self-pay

## 2024-02-02 ENCOUNTER — Emergency Department (HOSPITAL_BASED_OUTPATIENT_CLINIC_OR_DEPARTMENT_OTHER)

## 2024-02-02 DIAGNOSIS — Z91040 Latex allergy status: Secondary | ICD-10-CM | POA: Diagnosis not present

## 2024-02-02 DIAGNOSIS — Z79899 Other long term (current) drug therapy: Secondary | ICD-10-CM | POA: Insufficient documentation

## 2024-02-02 DIAGNOSIS — Z7982 Long term (current) use of aspirin: Secondary | ICD-10-CM | POA: Insufficient documentation

## 2024-02-02 DIAGNOSIS — M25512 Pain in left shoulder: Secondary | ICD-10-CM | POA: Diagnosis present

## 2024-02-02 DIAGNOSIS — I10 Essential (primary) hypertension: Secondary | ICD-10-CM | POA: Diagnosis not present

## 2024-02-02 MED ORDER — LIDOCAINE 5 % EX PTCH: 1.0000 | MEDICATED_PATCH | CUTANEOUS | 0 refills | Status: AC

## 2024-02-02 MED ORDER — PREDNISONE 10 MG PO TABS: 30.0000 mg | ORAL_TABLET | Freq: Every day | ORAL | 0 refills | Status: AC

## 2024-02-02 MED ORDER — KETOROLAC TROMETHAMINE 30 MG/ML IJ SOLN
15.0000 mg | Freq: Once | INTRAMUSCULAR | Status: AC
  Administered 2024-02-02: 15 mg via INTRAMUSCULAR
  Filled 2024-02-02: qty 1

## 2024-02-02 MED ORDER — METHOCARBAMOL 500 MG PO TABS: 500.0000 mg | ORAL_TABLET | Freq: Two times a day (BID) | ORAL | 0 refills | Status: AC

## 2024-02-02 NOTE — Discharge Instructions (Addendum)
 Please follow-up with your primary care doctor and request physical therapy referral.  You may also call physical therapy specialist in the area and see if they can see you directly.  Your x-ray does not show any abnormal findings.  Tylenol  and ibuprofen  as discussed below Robaxin and Lidoderm  patches for pain   If you are still having pain in 7 days from now you may start taking prednisone take for 5 days as prescribed. Please use Tylenol  or ibuprofen  for pain.  You may use 600 mg ibuprofen  every 6 hours or 1000 mg of Tylenol  every 6 hours.  You may choose to alternate between the 2.  This would be most effective.  Not to exceed 4 g of Tylenol  within 24 hours.  Not to exceed 3200 mg ibuprofen  24 hours.

## 2024-02-02 NOTE — ED Provider Notes (Signed)
 [facility] Provider Note   CSN: [id number] Arrival date & time: 02/02/24  1205     Patient presents with: Shoulder Pain   Nicole Rasmussen is a 63 y.o. female.    Shoulder Pain  Patient is a 63 year old female with a past medical history significant for reflux, HLD, hypertension, anemia  Patient presents emergency room today with complaints of left shoulder pain ongoing for months but seems to have been worse over the past few days no known specific injury.  Has a history of cervical radiculopathy however her symptoms today seem to be more focal pain in her left shoulder.  She denies any numbness in her hand or any weakness.  Does sometimes have some prickly sensations in her hand.      Prior to Admission medications   Medication Sig Start Date End Date Taking? Authorizing Provider  lidocaine  (LIDODERM ) 5 % Place 1 patch onto the skin daily. Remove & Discard patch within 12 hours or as directed by MD 02/02/24  Yes Barre Aydelott, Hamp RAMAN, PA  methocarbamol (ROBAXIN) 500 MG tablet Take 1 tablet (500 mg total) by mouth 2 (two) times daily. 02/02/24  Yes Rosalinda Seaman S, PA  predniSONE (DELTASONE) 10 MG tablet Take 3 tablets (30 mg total) by mouth daily for 5 days. 02/02/24 02/07/24 Yes Sunya Humbarger S, PA  acetaminophen  (TYLENOL ) 500 MG tablet Take 1,000 mg by mouth every 6 (six) hours as needed for moderate pain (pain score 4-6).    [provider]  albuterol  (VENTOLIN  HFA) 108 (90 Base) MCG/ACT inhaler Inhale 2 puffs into the lungs every 4 (four) hours as needed for wheezing or shortness of breath. 10/04/23   Jeneal Danita Macintosh, MD  aspirin EC 81 MG tablet Take 81 mg by mouth daily.    [provider]  atorvastatin (LIPITOR) 10 MG tablet Take 10 mg by mouth at bedtime.    [provider]  carboxymethylcellulose (REFRESH PLUS) 0.5 % SOLN Place 1 drop into both eyes 3 (three) times daily as needed (dry eyes).     [provider]  ciprofloxacin (CIPRO) 500 MG/5ML (10%) suspension Take 500 mg by mouth 2 (two) times daily. Do NOT administer via tube Patient not taking: Reported on 11/02/2023    [provider]  cyanocobalamin (VITAMIN B12) 500 MCG tablet Take 500 mcg by mouth daily.    [provider]  cyclobenzaprine (FLEXERIL) 10 MG tablet Take 10 mg by mouth 3 (three) times daily as needed for muscle spasms.    [provider]  EPINEPHrine  (EPIPEN  2-PAK) 0.3 mg/0.3 mL IJ SOAJ injection Inject 0.3 mg into the muscle as needed for anaphylaxis. 12/22/21   Marinda Rocky SAILOR, MD  EPINEPHrine  (NEFFY ) 2 MG/0.1ML SOLN Place 1 spray into the nose as needed. 10/04/23   Jeneal Danita Macintosh, MD  fluconazole (DIFLUCAN) 200 MG tablet Take 200 mg by mouth daily. Patient not taking: Reported on 11/02/2023    [provider]  folic acid  (FOLVITE ) 1 MG tablet Take 1 tablet (1 mg total) by mouth daily. 05/22/23   Leigh Venetia CROME, MD  gabapentin  (NEURONTIN ) 100 MG capsule Take 1 capsule (100 mg) in the morning and take 3 capsules (300 mg) in the evening Patient taking differently: as needed. Take 1 capsule (100 mg) in the morning and take 3 capsules (300 mg) in the evening 05/11/23   Leigh Venetia CROME, MD  gabapentin  (NEURONTIN ) 300 MG capsule Take 300 mg by mouth at bedtime  as needed (Nerve Pain). 07/25/23   [provider]  levocetirizine (XYZAL ) 5 MG tablet Take 1 tablet (5 mg total) by mouth every evening. 10/04/23   Jeneal Danita Macintosh, MD  meloxicam (MOBIC) 15 MG tablet Take 15 mg by mouth daily as needed for pain.    [provider]  Multiple Vitamins-Minerals (CENTRUM SILVER  50+WOMEN) TABS Take 1 tablet by mouth daily.    [provider]  Multiple Vitamins-Minerals (HAIR SKIN & NAILS PO) Take 1 tablet by mouth daily.    [provider]  olmesartan (BENICAR) 20 MG tablet Take 20 mg by mouth daily. 08/15/22   [provider]  RYALTRIS  [phone]  MCG/ACT SUSP Place 2 sprays into the nose 2 (two) times daily. 10/04/23   Jeneal Danita Macintosh, MD  senna-docusate (SENOKOT-S) 8.6-50 MG tablet Take 2 tablets by mouth at bedtime. For AFTER surgery, do not take if having diarrhea Patient not taking: Reported on 11/02/2023 09/11/23   Micheline Setter D, NP  traMADol  (ULTRAM ) 50 MG tablet Take 1 tablet (50 mg total) by mouth every 6 (six) hours as needed for severe pain (pain score 7-10). For AFTER surgery only, do not take and drive Patient not taking: Reported on 11/02/2023 09/11/23   Micheline Setter D, NP    Allergies: Shellfish allergy , Latex, and Sulfonamide derivatives    Review of Systems  Updated Vital Signs BP 122/81 (BP Location: Right Arm)   Pulse 83   Temp 98.4 F (36.9 C) (Oral)   Resp 16   Ht 5' 8 (1.727 m)   Wt 104.3 kg   LMP 10/11/2010   SpO2 100%   BMI 34.97 kg/m   Physical Exam Vitals and nursing note reviewed.  Constitutional:      General: She is not in acute distress.    Appearance: Normal appearance. She is not ill-appearing.  HENT:     Head: Normocephalic and atraumatic.  Eyes:     General: No scleral icterus.       Right eye: No discharge.        Left eye: No discharge.     Conjunctiva/sclera: Conjunctivae normal.  Pulmonary:     Effort: Pulmonary effort is normal.     Breath sounds: No stridor.  Musculoskeletal:     Comments: Bilateral grip 5/5 strength  Sensation normal in bilateral hands  There is discomfort with range of motion of left shoulder but full range of motion is intact.  Skin:    General: Skin is warm and dry.  Neurological:     Mental Status: She is alert and oriented to person, place, and time. Mental status is at baseline.     (all labs ordered are listed, but only abnormal results are displayed) Labs Reviewed - No data to display  EKG: None  Radiology: DG Shoulder Left Result Date: 02/02/2024 CLINICAL DATA:  Left shoulder pain three days.  No injury. EXAM: LEFT SHOULDER -  2+ VIEW COMPARISON:  None Available. FINDINGS: Subtle degenerative changes of the [facility] joint and glenohumeral joints. No acute fracture or dislocation. No focal lytic or sclerotic lesion. IMPRESSION: 1. No acute findings. 2. Subtle degenerative changes. Electronically Signed   By: Toribio Agreste M.D.   On: 02/02/2024 14:30     Procedures   Medications Ordered in the ED  ketorolac  (TORADOL ) 30 MG/ML injection 15 mg (15 mg Intramuscular Given 02/02/24 1333)  Medical Decision Making Amount and/or Complexity of Data Reviewed Radiology: ordered.  Risk Prescription drug management.   Patient is a 63 year old female with a past medical history significant for reflux, HLD, hypertension, anemia  Patient presents emergency room today with complaints of left shoulder pain ongoing for months but seems to have been worse over the past few days no known specific injury.  Has a history of cervical radiculopathy however her symptoms today seem to be more focal pain in her left shoulder.  She denies any numbness in her hand or any weakness.  Does sometimes have some prickly sensations in her hand.  Left shoulder x-ray without fracture or acute abnormal finding  Toradol  given IM  Discharged home with Lidoderm  Robaxin and recommendations to trial out prednisone if symptoms do not improve in 1 week.  Follow-up with primary care for referral to physical therapy.   Final diagnoses:  Acute pain of left shoulder    ED Discharge Orders          Ordered    lidocaine  (LIDODERM ) 5 %  Every 24 hours        02/02/24 1453    methocarbamol (ROBAXIN) 500 MG tablet  2 times daily        02/02/24 1453    predniSONE (DELTASONE) 10 MG tablet  Daily        02/02/24 1453               Neldon Hamp RAMAN, [place] 02/03/24 1014    Darra Fonda MATSU, MD 02/05/24 [phone]

## 2024-02-02 NOTE — ED Triage Notes (Addendum)
 Pt c/o L and R arm and shoulder pain, L worse than R x 3 days. Denies known injury.   Hx of cervical nerve compression. Pain worse with movement, some tingling.   Took tylenol  appx 0700 today. Prescribed flexeril and mobic ineffective.

## 2024-02-02 NOTE — ED Notes (Signed)
 Russella into speak to pt before she left

## 2024-03-11 ENCOUNTER — Ambulatory Visit: Payer: Self-pay | Admitting: Pulmonary Disease

## 2024-03-11 NOTE — Telephone Encounter (Addendum)
 NT has made 3 attempts to call the patient without success. Voicemail messages left for the patient to call back. Routing to clinic.       Copied from CRM [id number]. Topic: Clinical - Red Word Triage >> Mar 11, 2024  9:01 AM Nicole Rasmussen wrote: [facility] that prompted transfer to Nurse Triage: Experiencing sharp uncomfortable pain on the right side where her lung nodule is.

## 2024-03-11 NOTE — Telephone Encounter (Signed)
 I called the pt and there was no answer- LMTCB. ?

## 2024-03-12 NOTE — Telephone Encounter (Signed)
 Closeing encounter per policy d/t multiple attempts to reach patient with no return call despite multiple messages left.

## 2024-03-21 ENCOUNTER — Ambulatory Visit (INDEPENDENT_AMBULATORY_CARE_PROVIDER_SITE_OTHER): Admitting: Allergy

## 2024-03-21 ENCOUNTER — Other Ambulatory Visit: Payer: Self-pay

## 2024-03-21 ENCOUNTER — Encounter: Payer: Self-pay | Admitting: Allergy

## 2024-03-21 VITALS — BP 132/88 | HR 78 | Temp 97.7°F | Resp 16

## 2024-03-21 DIAGNOSIS — J0140 Acute pansinusitis, unspecified: Secondary | ICD-10-CM | POA: Diagnosis not present

## 2024-03-21 MED ORDER — AMOXICILLIN-POT CLAVULANATE 875-125 MG PO TABS: 1.0000 | ORAL_TABLET | Freq: Two times a day (BID) | ORAL | 0 refills | Status: AC

## 2024-03-21 MED ORDER — LEVOCETIRIZINE DIHYDROCHLORIDE 5 MG PO TABS: 5.0000 mg | ORAL_TABLET | Freq: Every evening | ORAL | 5 refills | Status: AC

## 2024-03-21 MED ORDER — RYALTRIS 665-25 MCG/ACT NA SUSP: 2.0000 | Freq: Two times a day (BID) | NASAL | 5 refills | Status: AC

## 2024-03-21 MED ORDER — METHYLPREDNISOLONE ACETATE 80 MG/ML IJ SUSP
80.0000 mg | Freq: Once | INTRAMUSCULAR | Status: AC
  Administered 2024-03-21: 80 mg via INTRAMUSCULAR

## 2024-03-21 MED ORDER — EPINEPHRINE 0.3 MG/0.3ML IJ SOAJ: 0.3000 mg | INTRAMUSCULAR | 1 refills | Status: AC | PRN

## 2024-03-21 NOTE — Progress Notes (Signed)
 Follow-up Note  RE: [name] MRN: [id number] DOB: [birth year] Date of Office Visit: 03/21/2024   History of present illness: Nicole Rasmussen is a 63 y.o. female presenting today for sick visit.  She was last seen in the office on 10/04/2023 for allergic rhinitis. Discussed the use of AI scribe software for clinical note transcription with the patient, who gave verbal consent to proceed.  For the past two weeks, she has experienced worsening symptoms of nasal congestion, difficulty breathing, headaches, cough, and plugged ears. She felt feverish last night but was unable to confirm with a thermometer and also experienced chills.  She has been using her routine allergy  medications and has tried over-the-counter treatments such as Mucinex and Tylenol , but these have not provided relief. Her inhaler has helped with breathing difficulties.  No sore throat, but she mentions some throat irritation. She has not been in contact with anyone known to be sick and continues to wear a mask regularly.      Review of systems: 10pt ROS negative unless noted in HPI  Past medical/social/surgical/family history have been reviewed and are unchanged unless specifically indicated below.  No changes  Medication List: Current Outpatient Medications  Medication Sig Dispense Refill   acetaminophen  (TYLENOL ) 500 MG tablet Take 1,000 mg by mouth every 6 (six) hours as needed for moderate pain (pain score 4-6).     albuterol  (VENTOLIN  HFA) 108 (90 Base) MCG/ACT inhaler Inhale 2 puffs into the lungs every 4 (four) hours as needed for wheezing or shortness of breath. 18 g 1   amoxicillin -clavulanate (AUGMENTIN ) 875-125 MG tablet Take 1 tablet by mouth 2 (two) times daily for 10 days. 20 tablet 0   aspirin EC 81 MG tablet Take 81 mg by mouth daily.     carboxymethylcellulose (REFRESH PLUS) 0.5 % SOLN Place 1 drop into both eyes 3 (three) times daily as needed (dry eyes).     cyanocobalamin (VITAMIN B12)  500 MCG tablet Take 500 mcg by mouth daily.     cyclobenzaprine (FLEXERIL) 10 MG tablet Take 10 mg by mouth 3 (three) times daily as needed for muscle spasms.     EPINEPHrine  (EPIPEN  2-PAK) 0.3 mg/0.3 mL IJ SOAJ injection Inject 0.3 mg into the muscle as needed for anaphylaxis. 2 each 1   EPINEPHrine  (EPIPEN  2-PAK) 0.3 mg/0.3 mL IJ SOAJ injection Inject 0.3 mg into the muscle as needed for anaphylaxis. 0.3 mL 1   gabapentin  (NEURONTIN ) 100 MG capsule Take 1 capsule (100 mg) in the morning and take 3 capsules (300 mg) in the evening 120 capsule 5   gabapentin  (NEURONTIN ) 300 MG capsule Take 300 mg by mouth at bedtime as needed (Nerve Pain).     meloxicam (MOBIC) 15 MG tablet Take 15 mg by mouth daily as needed for pain.     Multiple Vitamins-Minerals (CENTRUM SILVER  50+WOMEN) TABS Take 1 tablet by mouth daily.     olmesartan (BENICAR) 20 MG tablet Take 20 mg by mouth daily.     atorvastatin (LIPITOR) 10 MG tablet Take 10 mg by mouth at bedtime. (Patient not taking: Reported on 03/21/2024)     ciprofloxacin (CIPRO) 500 MG/5ML (10%) suspension Take 500 mg by mouth 2 (two) times daily. Do NOT administer via tube (Patient not taking: Reported on 03/21/2024)     EPINEPHrine  (NEFFY ) 2 MG/0.1ML SOLN Place 1 spray into the nose as needed. (Patient not taking: Reported on 03/21/2024) 4 each 1   fluconazole (DIFLUCAN) 200 MG tablet Take 200 mg by  mouth daily. (Patient not taking: Reported on 03/21/2024)     folic acid  (FOLVITE ) 1 MG tablet Take 1 tablet (1 mg total) by mouth daily. 30 tablet 5   levocetirizine (XYZAL ) 5 MG tablet Take 1 tablet (5 mg total) by mouth every evening. 30 tablet 5   lidocaine  (LIDODERM ) 5 % Place 1 patch onto the skin daily. Remove & Discard patch within 12 hours or as directed by MD (Patient not taking: Reported on 03/21/2024) 30 patch 0   methocarbamol  (ROBAXIN ) 500 MG tablet Take 1 tablet (500 mg total) by mouth 2 (two) times daily. (Patient not taking: Reported on 03/21/2024) 20  tablet 0   Multiple Vitamins-Minerals (HAIR SKIN & NAILS PO) Take 1 tablet by mouth daily. (Patient not taking: Reported on 03/21/2024)     RYALTRIS  665-25 MCG/ACT SUSP Place 2 sprays into the nose 2 (two) times daily. 29 g 5   senna-docusate (SENOKOT-S) 8.6-50 MG tablet Take 2 tablets by mouth at bedtime. For AFTER surgery, do not take if having diarrhea (Patient not taking: Reported on 03/21/2024) 30 tablet 0   traMADol  (ULTRAM ) 50 MG tablet Take 1 tablet (50 mg total) by mouth every 6 (six) hours as needed for severe pain (pain score 7-10). For AFTER surgery only, do not take and drive (Patient not taking: Reported on 03/21/2024) 10 tablet 0   No current facility-administered medications for this visit.     Known medication allergies: Allergies  Allergen Reactions   Shellfish Allergy  Hives and Swelling   Latex Hives   Sulfonamide Derivatives Itching and Rash     Physical examination: Blood pressure 132/88, pulse 78, temperature 97.7 F (36.5 C), temperature source Temporal, resp. rate 16, last menstrual period 10/11/2010, SpO2 96%.  General: Alert, interactive, in no acute distress. HEENT: PERRLA, TMs dull bilaterally with visible fluid without bulging or erythema, turbinates markedly edematous with thick discharge, post-pharynx non erythematous. Neck: Supple without lymphadenopathy. Lungs: Clear to auscultation without wheezing, rhonchi or rales. {no increased work of breathing. CV: Normal S1, S2 without murmurs. Abdomen: Nondistended, nontender. Skin: Warm and dry, without lesions or rashes. Extremities:  No clubbing, cyanosis or edema. Neuro:   Grossly intact.  Diagnostics/Labs: Depo-Medrol  80 mg injection given in office today  Assessment and plan: Acute bacterial sinusitis Symptoms worsened over two weeks, indicating likely transition into bacterial infection.  - Administered steroid injection in office today. - Take Augmentin  875mg  1 tab twice a day for 10 days.  -  Advised hydration and rest. - Recommended nasal rinses.  Allergic Rhinitis Food allergy  Shortness of breath - Continue avoidance measures for grass pollen, weed, tree pollen, minor indoor/outdoor molds, dust mites  - Use nasal saline rinses or your nasal saline spray before nose sprays such as with Neilmed Sinus Rinse.  Use distilled water .   - Use Ryaltris  2 sprays each nostril twice daily for runny or stuffy nose.  With using nasal sprays point tip of bottle toward eye on same side nostril and lean head slightly forward for best technique.   - Use Xyzal  5 mg daily.  - Have access to albuterol  inhaler 2 puffs every 4-6 hours as needed for cough/wheeze/shortness of breath/chest tightness.  May use 15-20 minutes prior to activity.   Monitor frequency of use.   - Continue avoidance of shellfish in diet - Have access to self-injectable epinephrine  (Epipen  0.3mg ) OR nasal epinephrine  (Neffy ) at all times.   Discussed Neffy  today as an option to have for allergic reactions and will send  this prescription to the specialty pharmacy who will contact you regarding coverage.  If not covered then let me know so I can refill your Epipen .  - follow emergency action plan in case of allergic reaction  Keep previously scheduled visit in June 2026  I appreciate the opportunity to take part in Addilynn's care. Please do not hesitate to contact me with questions.  Sincerely,   Danita Brain, MD Allergy /Immunology [facility]

## 2024-03-21 NOTE — Patient Instructions (Addendum)
 Acute bacterial sinusitis Symptoms worsened over two weeks, indicating likely transition into bacterial infection.  - Administered steroid injection in office today. - Take Augmentin  875mg  1 tab twice a day for 10 days.  - Advised hydration and rest. - Recommended nasal rinses.  Allergic Rhinitis Food allergy  Shortness of breath - Continue avoidance measures for grass pollen, weed, tree pollen, minor indoor/outdoor molds, dust mites  - Use nasal saline rinses or your nasal saline spray before nose sprays such as with Neilmed Sinus Rinse.  Use distilled water .   - Use Ryaltris  2 sprays each nostril twice daily for runny or stuffy nose.  With using nasal sprays point tip of bottle toward eye on same side nostril and lean head slightly forward for best technique.   - Use Xyzal  5 mg daily.  - Have access to albuterol  inhaler 2 puffs every 4-6 hours as needed for cough/wheeze/shortness of breath/chest tightness.  May use 15-20 minutes prior to activity.   Monitor frequency of use.   - Continue avoidance of shellfish in diet - Have access to self-injectable epinephrine  (Epipen  0.3mg ) OR nasal epinephrine  (Neffy ) at all times.   Discussed Neffy  today as an option to have for allergic reactions and will send this prescription to the specialty pharmacy who will contact you regarding coverage.  If not covered then let me know so I can refill your Epipen .  - follow emergency action plan in case of allergic reaction  Keep previously scheduled visit in June 2026

## 2024-04-24 NOTE — Progress Notes (Signed)
 "  I saw Nicole Rasmussen in neurology clinic on 05/09/23 in follow up for neuropathy.  HPI: Nicole Rasmussen is a 63 y.o. year old female with a history of B12 deficiency, folate deficiency, B1 deficiency, OA c/b joint pain and back pain, GERD, HLD, prediabetes, vit D deficiency who we last saw on 11/02/23.  To briefly review: Initial consultation 12/17/21: Patient is having tingling in her left neck, arm, and leg. When she has back pain, it is also on the left. The tingling in the arms started in the last 6 months. It was coming and going until about 2 months ago when it became constant. It is a shock like sensation. It radiates from her neck into all of her fingers. Bending her neck (like when shampooing at the hair salon) makes the sensations worse. The pain is particularly uncomfortable at night and can wake her up. She can straighten her arm and it will help sometimes. She sleeps on her side, but tries to avoid her left side. She often has her arms curled up. The pain can be 10/10, but averages 6/10. It occasionally can complete go away. She occasionally gets similar symptoms on the right but very rare. She denies weakness. She does have pain in her right knee that makes walking difficult at times.    She takes gabapentin  400 mg QID (given by PCP). It helps knock down the pain along with diclofenac , flexeril, and tylenol . She has previously tried PT (many years ago) for her back and this did not help much.   She also endorses tingling in both feet (maybe right more than left). She does not currently have this tingling though.   Patient was seen by Dr. Avram in GI for abdominal pain of unknown origin. The clinic note from 09/17/21 was reviewed. Patient had previously been seen by [facility], but wanted second opinion for her pain, so consult to [facility] neurology was placed.    Patient previously saw Dr. Buck at [facility] (last seen 05/06/20). The clinic notes indicate patient had  burning/tingling sensations in her right thigh. She also described radiating pain from lower back and a long standing history of low back pain. Per 12/15/15 office note:  she had a lumbar spine MRI without contrast at [facility] on 09/27/2013: Depression: This is an abnormal MRI of the lumbar spine showing degenerative changes at L3-L4, L4-L5, and L5-S1 as detailed above. The post significant findings are at L5-S1 were severe facet hypertrophy causes moderate left foraminal narrowing that could lead to dynamic impingement of the left L5 nerve root. Patient had EMG on 01/27/16. Per clinic note on 04/18/16, the impression was:  This NCV/EMG study showed the following: 1.    No evidence of polyneuropathy.    2.    Mild chronic right L5 radiculopathy without active features. 3.    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   MRI of lumbar spine showed degenerative changes so patient was referred to spine. She saw Dr. Jerri in othropedics. The note from 10/10/20 indicates she was seen for low back pain. Patient was referred to PT, given diclofenac , and recommended to lose weight.   EtOH use: Socially, beer or wine (1-2 per day; 4 times per week)  Restrictive diet? No Family history of neuropathy/myopathy/NM disease? None   04/06/22: Labs showed a borderline low B12 level (284). EMG of the LUE was consistent with upper limb manifestations of PN with no evidence of cervical radiculopathy or  median or ulnar mononeuropathy.   Patient's pain is getting worse in her neck. It is still on the left. When she tilts her head back, she gets pain in the left neck and her arm tingling. She takes ibuprofen  for her knee, but this also helps with shoulder. She is also on flexeril and meloxicam for her neck discomfort.   She takes gabapentin  after work and again before bed usually. She takes 400 mg 2-3 times per day. It helps. She is happy with the relief she gets at night. She is  not able to take gabapentin  during the day because it makes her too sleepy.    She went to PT. She went to 6-7 sessions. This did not help, so she stopped going. She was doing home exercises, but this did not help.    She has also gotten a TENS unit, but this has also not helped much.   05/11/23: MRI cervical spine on 05/14/22 showed significant left foraminal stenosis at C5-6. Patient was referred to neurosurgery given worsening pain. She saw Dr. Onetha at [facility] who recommend surgery given failure of conservative treatment. Patient declined this due other issues going on including abdominal pain and lightheadedness and imbalance.   The left shoulder pain was doing much better until recently when it started to flare again. She continues to have tingling in her legs and feet. She is having a lot of numbness and burning in the feet.   Patient is no longer taking B12, stopping quite at while ago.   She continues to take gabapentin  100 mg in the morning and 300 mg in the evening. She takes this as needed. She will take it at least 3-4 times per week. She feels this dose is working for her.   11/02/23: Labs were significant for borderline low B12, low folate, and low B1. I recommended supplementation of each on 05/18/23. She is taking these.    hTTR amyloidosis genetic testing was normal.   Patient was diagnosed with endometrial cancer since last diagnosis. She had surgery but does need chemo or radiation and is currently cancer free.   Patient thinks her neuropathy may be getting a little worse. She has noticed more numbness and tingling in her feet (left more than right). Elevating her feet can help a little. She feels like her left leg has a dent in it.    Patient is still taking gabapentin  100 mg (maybe twice during the day if at work) then will take 300 mg at night. It will make her sleepy so she cannot take more than 100 mg.   Her left arm continues to bother her. She  plans to follow up with Dr. Onetha who had recommended surgery for cervical radiculopathy.  Most recent Assessment and Plan (11/02/23): This is Nicole Rasmussen, a 63 y.o. 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 Risk factors include pre-DM, folate deficiency, B1 deficiency, and borderline low B12. She feels like her feet are a little worse than prior. Her exam is stable. She is benefiting from gabapentin , but it makes her too tired preventing increasing her dosage further. Cervical radiculopathy - Left C5-6 by MRI cervical spine. Patient follows with Dr. Onetha at [facility]   Plan: -Continue gabapentin  100 mg during the day and 300 mg at night -Lidocaine  cream PRN -Alpha lipoic acid 600 mg daily -Follow up with Dr. Onetha if interested in discussed cervical spine  surgery -Continue B12, B1, folate supplementation  Since their last visit: Overall patient thinks she is doing well. She does think the numbness and tingling in her legs has gotten a little worse. She is wondering about poor blood flow today.  She continues to have neck pain. She thinks this has also gotten worse. She wonders if she is nearing doing surgery. She is helping take care of her husband, so she has tried to delay this.  She is still taking gabapentin  100 mg during the day and 300-400 mg at night. She does get relief if taken early.  She is also still taking B1, B12 and folate.   MEDICATIONS:  Outpatient Encounter Medications as of 05/08/2024  Medication Sig Note   acetaminophen  (TYLENOL ) 500 MG tablet Take 1,000 mg by mouth every 6 (six) hours as needed for moderate pain (pain score 4-6).    albuterol  (VENTOLIN  HFA) 108 (90 Base) MCG/ACT inhaler Inhale 2 puffs into the lungs every 4 (four) hours as needed for wheezing or shortness of breath.    aspirin EC 81 MG tablet Take 81 mg by mouth daily.    carboxymethylcellulose (REFRESH PLUS) 0.5 % SOLN  Place 1 drop into both eyes 3 (three) times daily as needed (dry eyes).    cyanocobalamin  (VITAMIN B12) 500 MCG tablet Take 500 mcg by mouth daily.    cyclobenzaprine (FLEXERIL) 10 MG tablet Take 10 mg by mouth 3 (three) times daily as needed for muscle spasms.    EPINEPHrine  (EPIPEN  2-PAK) 0.3 mg/0.3 mL IJ SOAJ injection Inject 0.3 mg into the muscle as needed for anaphylaxis.    EPINEPHrine  (EPIPEN  2-PAK) 0.3 mg/0.3 mL IJ SOAJ injection Inject 0.3 mg into the muscle as needed for anaphylaxis.    folic acid  (FOLVITE ) 1 MG tablet Take 1 tablet (1 mg total) by mouth daily.    gabapentin  (NEURONTIN ) 100 MG capsule Take 1 capsule (100 mg) in the morning and take 3 capsules (300 mg) in the evening    gabapentin  (NEURONTIN ) 300 MG capsule Take 300 mg by mouth at bedtime as needed (Nerve Pain).    levocetirizine (XYZAL ) 5 MG tablet Take 1 tablet (5 mg total) by mouth every evening.    meloxicam (MOBIC) 15 MG tablet Take 15 mg by mouth daily as needed for pain.    Multiple Vitamins-Minerals (CENTRUM SILVER  50+WOMEN) TABS Take 1 tablet by mouth daily.    olmesartan (BENICAR) 20 MG tablet Take 20 mg by mouth daily.    RYALTRIS  665-25 MCG/ACT SUSP Place 2 sprays into the nose 2 (two) times daily.    atorvastatin (LIPITOR) 10 MG tablet Take 10 mg by mouth at bedtime. (Patient not taking: Reported on 05/08/2024)    ciprofloxacin (CIPRO) 500 MG/5ML (10%) suspension Take 500 mg by mouth 2 (two) times daily. Do NOT administer via tube (Patient not taking: Reported on 05/08/2024)    EPINEPHrine  (NEFFY ) 2 MG/0.1ML SOLN Place 1 spray into the nose as needed. (Patient not taking: Reported on 05/08/2024)    fluconazole (DIFLUCAN) 200 MG tablet Take 200 mg by mouth daily. (Patient not taking: Reported on 05/08/2024)    lidocaine  (LIDODERM ) 5 % Place 1 patch onto the skin daily. Remove & Discard patch within 12 hours or as directed by MD (Patient not taking: Reported on 05/08/2024)    methocarbamol  (ROBAXIN ) 500 MG tablet Take 1  tablet (500 mg total) by mouth 2 (two) times daily. (Patient not taking: Reported on 05/08/2024)    Multiple Vitamins-Minerals (HAIR SKIN &  NAILS PO) Take 1 tablet by mouth daily. (Patient not taking: Reported on 05/08/2024)    senna-docusate (SENOKOT-S) 8.6-50 MG tablet Take 2 tablets by mouth at bedtime. For AFTER surgery, do not take if having diarrhea (Patient not taking: Reported on 05/08/2024) 10/02/2023: For after procedure    traMADol  (ULTRAM ) 50 MG tablet Take 1 tablet (50 mg total) by mouth every 6 (six) hours as needed for severe pain (pain score 7-10). For AFTER surgery only, do not take and drive (Patient not taking: Reported on 05/08/2024) 10/02/2023: For after procedure    No facility-administered encounter medications on file as of 05/08/2024.    PAST MEDICAL HISTORY: Past Medical History:  Diagnosis Date   Acid reflux    Allergy     Anemia    Arthritis    Asthma    no inhalers   B12 deficiency    Cancer (HCC)    Dysmetabolic syndrome X    Dyspnea    Family history of adverse reaction to anesthesia    Food allergy     shellfish   GERD (gastroesophageal reflux disease)    HLD (hyperlipidemia)    Hypertension    Joint pain    knees and back   Lower back pain    Nerve pain    top of right thigh   Neuromuscular disorder (HCC)    bilateral leg pain from back issue   Pre-diabetes    Prediabetes    Shortness of breath    Sinus problem    Swallowing difficulty    Vitamin B 12 deficiency    Vitamin D  deficiency     PAST SURGICAL HISTORY: Past Surgical History:  Procedure Laterality Date   CESAREAN SECTION     1 time   COLONOSCOPY     ESOPHAGOGASTRODUODENOSCOPY     FOOT SURGERY Bilateral 1980's   bunionectomy and hammer toe on both feet   HYSTEROSCOPY WITH D & C N/A 08/11/2023   Procedure: DILATATION AND CURETTAGE /HYSTEROSCOPY;  Surgeon: Diedre Rosaline BRAVO, MD;  Location: [facility] OR;  Service: Gynecology;  Laterality: N/A;   INJECTION, FOR SENTINEL LYMPH NODE IDENTIFICATION  Bilateral 10/10/2023   Procedure: INJECTION, FOR SENTINEL LYMPH NODE IDENTIFICATION;  Surgeon: Eldonna Mays, MD;  Location: [facility] ORS;  Service: Gynecology;  Laterality: Bilateral;   LYMPH NODE BIOPSY Bilateral 10/10/2023   Procedure: BILATERAL SENINTELY LYMPH NODE BIOPSY;  Surgeon: Eldonna Mays, MD;  Location: [facility];  Service: Gynecology;  Laterality: Bilateral;   MOUTH SURGERY  2011   had implants/ root canal/bridges   ROBOTIC ASSISTED TOTAL HYSTERECTOMY WITH BILATERAL SALPINGO OOPHERECTOMY Bilateral 10/10/2023   Procedure: ROBOTIC-ASSISTED TOTAL LAPAROSCOPIC HYSTERECTOMY WITH BILATERAL SALPINGO-OOPHORECTOMY;  Surgeon: Eldonna Mays, MD;  Location: [facility];  Service: Gynecology;  Laterality: Bilateral;   WISDOM TOOTH EXTRACTION     with the implants    ALLERGIES: Allergies[1]  FAMILY HISTORY: Family History  Problem Relation Age of Onset   Hypertension Mother    Hyperlipidemia Mother    Heart disease Mother    Breast cancer Mother 14   Uterine cancer Mother    Kidney disease Father    Prostate cancer Father    Hyperlipidemia Sister    Diabetes Sister    Hypertension Sister    Hypertension Sister    Hypertension Sister    Hypertension Sister    Breast cancer Sister 28   Hyperlipidemia Brother    Hypertension Brother    Prostate cancer Brother    Hypertension Brother    Hypertension Brother  Hypertension Brother    Colon cancer Neg Hx    Esophageal cancer Neg Hx    Pancreatic cancer Neg Hx    Stomach cancer Neg Hx    Ovarian cancer Neg Hx     SOCIAL HISTORY: Social History[2] Social History   Social History Narrative   Occasional caffeine use    Right handed    Live in a one level home husband and daughter when home from school   Duty Sheriff     Objective:  Vital Signs:  BP 127/84   Pulse 74   Ht 5' 8 (1.727 m)   Wt 240 lb (108.9 kg)   LMP 10/11/2010   SpO2 98%   BMI 36.49 kg/m   General: General appearance: Awake and alert. No distress.  Cooperative with exam.  Skin: No obvious rash or jaundice. HEENT: Atraumatic. Anicteric. Lungs: Non-labored breathing on room air  Heart: Regular Extremities: No edema. No obvious deformity.   Neurological: Mental Status: Alert. Speech fluent. No pseudobulbar affect Cranial Nerves: CNII: No RAPD. Visual fields intact. CNIII, IV, VI: PERRL. No nystagmus. EOMI. CN V: Facial sensation intact bilaterally to fine touch. CN VII: Facial muscles symmetric and strong. No ptosis at rest. CN VIII: Hears finger rub well bilaterally. CN IX: No hypophonia. CN X: Palate elevates symmetrically. CN XI: Full strength shoulder shrug bilaterally. CN XII: Tongue protrusion full and midline. No atrophy or fasciculations. No significant dysarthria Motor: Tone is normal. Strength is 5/5 in bilateral upper and lower limbs.  Reflexes:  Right Left  Bicep 2+ 2+  Tricep 2+ 2+  BrRad 2+ 2+  Knee 1+ 1+  Ankle 0 0   Sensation: Vibration: Diminished in bilateral great toes, otherwise intact Coordination: Intact finger-to- nose-finger bilaterally. Gait: Able to rise from chair with arms crossed unassisted. Normal, narrow-based gait.    Lab and Test Review: New results: 11/25/23: CBC w/ diff unremarkable CMP unremarkable  Previously reviewed results: 05/11/23: HbA1c: 5.9 B12: 353 Folate low at 5.0 B1 low at 6 MMA wnl K/L light chains wnl hTTR genetic testing: negative   05/04/23: CBC unremarkable CMP unremarkable   12/17/21: B12 284, IFE and SPEP with no M protein   HbA1c (08/2019): 6.3 TSH: 1.44   EMG (01/31/22): NCV & EMG Findings: Extensive electrodiagnostic evaluation of the left upper limb with additional nerve conduction studies of the right upper limb shows: Absent median and ulnar sensory responses bilaterally. The left radial sensory response is within normal limits. Bilateral medial antebrachial cutaneous sensory responses are absent and bilateral lateral antebrachial cutaneous  sensory response show reduced amplitude (L8, R8 V). Left median (APB) and left ulnar (ADM) motor responses are within normal limits. There is no evidence of active or chronic motor axon loss changes affecting any of the tested muscles. Motor unit configuration and recruitment pattern is within normal limits.   Impression: This is an abnormal study of the left upper limb. 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  The absent bilateral medial antebrachial cutaneous sensory responses and low amplitude bilateral lateral antebrachial cutaneous sensory response are likely technical in nature and not due to bilateral plexopathy given the normal needle examination.  There is no evidence of a left cervical (C5-T1) motor radiculopathy.  There is no definitive evidence of a left median mononeuropathy at or distal  to the wrist (ie no evidence of left carpal tunnel syndrome).   MRI lumbar spine (11/17/16): FINDINGS:  The lumbar vertebrae demonstrate normal alignment, body height and marrow signal. T12-L1 and L1-L2show only minor facet hypertrophy without compression.L2-3 shows asymmetric facet hypertrophy resulting in mild left narrowing but no compression.L3-4 shows severe lateral disc osteophyte bulge to the left and  asymmetric facet hypertrophy resulting in severe foraminal narrowing on left and likely impingement on the exiting nerve root on left.L4-5 also shows similar changes but moderate narrowing the left foramina.SABRA L5-S1 also shows asymmetric facet hypertrophy with severe left sided foraminall narrowing with likely impingement of the exiting nerve root.the conus medullaris terminates at L1. The visualized lower thoracic vertebrae and paraspinal soft tissue appear unremarkable.     MRI cervical spine (08/28/10): Findings: There is no abnormality at the foramen magnum, C1-2, C2-  3, C3-4 or C4-5.  The discs are normal.  The canal and foramina are  widely patent.  No osseous or articular finding.   At C5-6, there is spondylosis with endplate osteophytes and shallow  broad-based herniation more towards the left.  This effaces the  ventral subarachnoid space indents the cord slightly.  No foraminal  extension is seen.   C6-7, C7-T1 and T1-2 are normal.   IMPRESSION:  Single level pathology at C5-6, there is disc degeneration with  endplate osteophytes and shallow left posterolateral predominant  disc herniation.  This effaces the ventral subarachnoid space and  indents the cord slightly on the left.  No foraminal extension is  detected.    EMG (01/27/16 at [facility]): 1.    No evidence of polyneuropathy.    2.    Mild chronic right L5 radiculopathy without active features. 3.    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   CT abd/pelvis (05/04/23): IMPRESSION: 1. No filling defect is identified in the pulmonary arterial tree to suggest pulmonary embolus. 2. 2 mm punctate nonobstructive right mid kidney calculus. 3. Mild sigmoid colon diverticulosis. 4. Left foraminal impingement at L3-4, L4-5, and L5-S1 (most severe at L5-S1). 5. Aortic atherosclerosis.   MRI cervical spine wo contrast (05/14/22): FINDINGS: Alignment: Normal   Vertebrae: Normal marrow signal. No bone lesions or fractures.   Cord: Normal cord signal intensity. No cord lesions or syrinx.   Posterior Fossa, vertebral arteries, paraspinal tissues: No significant findings.   Disc levels:   C2-3: No significant findings.   C3-4: Very shallow central disc protrusion but no neural compression. No foraminal stenosis.   C4-5: No significant findings.   C5-6: Diffuse bulging annulus and mild osteophytic ridging with flattening of the ventral  thecal sac and mild narrowing of the ventral CSF space. There is also a left foraminal disc osteophyte complex with significant left foraminal stenosis likely involving the left C6 nerve root and responsible for the patient's left radicular symptoms.   C6-7: Minimal disc bulge but no significant disc protrusions, spinal or foraminal stenosis.   C7-T1: No significant findings. Slightly dilated exiting C8 nerve root sheaths purses perineural cysts. No significant findings.   IMPRESSION: 1. Left foraminal disc osteophyte complex at C5-6 with significant left foraminal stenosis likely responsible for the patient's left radicular symptoms. 2. Very shallow central disc protrusion at C3-4 but no neural compression. 3. Normal MR appearance of the cervical spinal cord.  ASSESSMENT: This is Nicole Rasmussen, a 63 y.o. 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 Risk  factors include pre-DM, folate deficiency, B1 deficiency, and borderline low B12. She feels like her feet are a little worse than prior. Her exam is stable. There may also be contributions from lumbar spine given her back pain and continued feeling of symptoms worsening. She has not had recent lumbar spine imaging, so it is reasonable to repeat. Cervical radiculopathy - Left C5-6 by MRI cervical spine. Patient follows with Dr. Onetha at [facility] B12 deficiency Folate deficiency B1 deficiency  Plan: -Will increase gabapentin  to 200 mg during the day and 400 mg at night -Alpha lipoic acid 600 mg daily -Continue B1, B12, and folate supplementation -MRI lumbar spine wo contrast -Follow up with Dr. Onetha if interested in discussing cervical spine surgery  -Fall precautions discussed.  Return to clinic in 6 months  Total time spent reviewing records, interview, history/exam, documentation, and coordination of care on day of encounter:  30 min  Venetia Potters, MD     [1]  Allergies Allergen Reactions   Shellfish Allergy  Hives and Swelling   Latex Hives   Sulfonamide Derivatives Itching and Rash  [2]  Social History Tobacco Use   Smoking status: Former    Current packs/day: 0.00    Average packs/day: 1 pack/day for 20.0 years (20.0 ttl pk-yrs)    Types: Cigarettes    Start date: 05/02/1976    Quit date: 05/02/1996    Years since quitting: 28.0    Passive exposure: Never   Smokeless tobacco: Never  Vaping Use   Vaping status: Never Used  Substance Use Topics   Alcohol use: Not Currently    Comment: social   Drug use: No   "

## 2024-05-08 ENCOUNTER — Encounter: Payer: Self-pay | Admitting: Neurology

## 2024-05-08 ENCOUNTER — Ambulatory Visit: Admitting: Neurology

## 2024-05-08 VITALS — BP 127/84 | HR 74 | Ht 68.0 in | Wt 240.0 lb

## 2024-05-08 DIAGNOSIS — M5412 Radiculopathy, cervical region: Secondary | ICD-10-CM | POA: Diagnosis not present

## 2024-05-08 DIAGNOSIS — E519 Thiamine deficiency, unspecified: Secondary | ICD-10-CM

## 2024-05-08 DIAGNOSIS — G629 Polyneuropathy, unspecified: Secondary | ICD-10-CM | POA: Diagnosis not present

## 2024-05-08 DIAGNOSIS — E538 Deficiency of other specified B group vitamins: Secondary | ICD-10-CM

## 2024-05-08 DIAGNOSIS — R200 Anesthesia of skin: Secondary | ICD-10-CM

## 2024-05-08 DIAGNOSIS — M5417 Radiculopathy, lumbosacral region: Secondary | ICD-10-CM

## 2024-05-08 DIAGNOSIS — R202 Paresthesia of skin: Secondary | ICD-10-CM | POA: Diagnosis not present

## 2024-05-08 DIAGNOSIS — R209 Unspecified disturbances of skin sensation: Secondary | ICD-10-CM | POA: Diagnosis not present

## 2024-05-08 MED ORDER — GABAPENTIN 300 MG PO CAPS: ORAL_CAPSULE | ORAL | 11 refills | Status: AC

## 2024-05-08 MED ORDER — GABAPENTIN 100 MG PO CAPS: ORAL_CAPSULE | ORAL | 11 refills | Status: AC

## 2024-05-08 NOTE — Patient Instructions (Signed)
 Given your back pain and worsening symptoms in the legs, I am ordering an MRI of your lumbar spine. A referral to [facility]) has been placed for your MRI someone will contact you directly to schedule your appt. They are located at [address] [facility]. Please contact them directly by calling [phone] with any questions regarding your referral.  If you do not hear from someone to schedule in 1-2 weeks, please let me know.   I will be in touch when I have your results.  We will increase your gabapentin  today to 200 mg in the morning and 400 mg in the evening.  Continue B1, B12, and folate.  Alpha lipoic acid 600mg  daily has some research data suggesting it helps with nerve health. No major side effects other than <1% of people report upset stomach. This can be taken twice per day (1200mg  daily) if no relief obtained. You can buy this over the counter or online.   Follow up with Dr. Onetha if you want to discuss cervical spine surgery.  I will see you again in about 6 months. Please let me know if you have any questions or concerns in the meantime.  The physicians and staff at [facility] are committed to providing excellent care. You may receive a survey requesting feedback about your experience at our office. We strive to receive very good responses to the survey questions. If you feel that your experience would prevent you from giving the office a very good  response, please contact our office to try to remedy the situation. We may be reached at [phone]. Thank you for taking the time out of your busy day to complete the survey.  Nicole Potters, MD [facility]  Preventing Falls at [facility] are common, often dreaded events in the lives of older people. Aside from the obvious injuries and even death that may result, fall can cause wide-ranging consequences including loss of independence, mental decline, decreased activity and mobility. Younger people are also  at risk of falling, especially those with chronic illnesses and fatigue.  Ways to reduce risk for falling Examine diet and medications. Warm foods and alcohol dilate blood vessels, which can lead to dizziness when standing. Sleep aids, antidepressants and pain medications can also increase the likelihood of a fall.  Get a vision exam. Poor vision, cataracts and glaucoma increase the chances of falling.  Check foot gear. Shoes should fit snugly and have a sturdy, nonskid sole and a broad, low heel  Participate in a physician-approved exercise program to build and maintain muscle strength and improve balance and coordination. Programs that use ankle weights or stretch bands are excellent for muscle-strengthening. Water  aerobics programs and low-impact Tai Chi programs have also been shown to improve balance and coordination.  Increase vitamin D  intake. Vitamin D  improves muscle strength and increases the amount of calcium the body is able to absorb and deposit in bones.  How to prevent falls from common hazards Floors - Remove all loose wires, cords, and throw rugs. Minimize clutter. Make sure rugs are anchored and smooth. Keep furniture in its usual place.  Chairs -- Use chairs with straight backs, armrests and firm seats. Add firm cushions to existing pieces to add height.  Bathroom - Install grab bars and non-skid tape in the tub or shower. Use a bathtub transfer bench or a shower chair with a back support Use an elevated toilet seat and/or safety rails to assist standing from a low surface.  Do not use towel racks or bathroom tissue holders to help you stand.  Lighting - Make sure halls, stairways, and entrances are well-lit. Install a night light in your bathroom or hallway. Make sure there is a light switch at the top and bottom of the staircase. Turn lights on if you get up in the middle of the night. Make sure lamps or light switches are within reach of the bed if you have to get up during  the night.  Kitchen - Install non-skid rubber mats near the sink and stove. Clean spills immediately. Store frequently used utensils, pots, pans between waist and eye level. This helps prevent reaching and bending. Sit when getting things out of lower cupboards.  Living room/ Bedrooms - Place furniture with wide spaces in between, giving enough room to move around. Establish a route through the living room that gives you something to hold onto as you walk.  Stairs - Make sure treads, rails, and rugs are secure. Install a rail on both sides of the stairs. If stairs are a threat, it might be helpful to arrange most of your activities on the lower level to reduce the number of times you must climb the stairs.  Entrances and doorways - Install metal handles on the walls adjacent to the doorknobs of all doors to make it more secure as you travel through the doorway.  Tips for maintaining balance Keep at least one hand free at all times. Try using a backpack or fanny pack to hold things rather than carrying them in your hands. Never carry objects in both hands when walking as this interferes with keeping your balance.  Attempt to swing both arms from front to back while walking. This might require a conscious effort if Parkinson's disease has diminished your movement. It will, however, help you to maintain balance and posture, and reduce fatigue.  Consciously lift your feet off of the ground when walking. Shuffling and dragging of the feet is a common culprit in losing your balance.  When trying to navigate turns, use a U technique of facing forward and making a wide turn, rather than pivoting sharply.  Try to stand with your feet shoulder-length apart. When your feet are close together for any length of time, you increase your risk of losing your balance and falling.  Do one thing at a time. Don't try to walk and accomplish another task, such as reading or looking around. The decrease in your  automatic reflexes complicates motor function, so the less distraction, the better.  Do not wear rubber or gripping soled shoes, they might catch on the floor and cause tripping.  Move slowly when changing positions. Use deliberate, concentrated movements and, if needed, use a grab bar or walking aid. Count 15 seconds between each movement. For example, when rising from a seated position, wait 15 seconds after standing to begin walking.  If balance is a continuous problem, you might want to consider a walking aid such as a cane, walking stick, or walker. Once you've mastered walking with help, you might be ready to try it on your own again.

## 2024-05-22 ENCOUNTER — Ambulatory Visit
Admission: RE | Admit: 2024-05-22 | Discharge: 2024-05-22 | Disposition: A | Source: Ambulatory Visit | Attending: Neurology

## 2024-05-22 DIAGNOSIS — M5417 Radiculopathy, lumbosacral region: Secondary | ICD-10-CM

## 2024-05-27 ENCOUNTER — Encounter: Payer: Self-pay | Admitting: Neurology

## 2024-05-27 ENCOUNTER — Ambulatory Visit: Payer: Self-pay | Admitting: Neurology

## 2024-10-02 ENCOUNTER — Ambulatory Visit: Admitting: Allergy

## 2024-11-08 ENCOUNTER — Ambulatory Visit: Payer: Self-pay | Admitting: Neurology
